# Patient Record
Sex: Male | Born: 1950 | Race: White | Hispanic: No | Marital: Single | Smoking: Never smoker
Health system: Southern US, Community
[De-identification: ages and names within clinical notes are randomized; demographics above are authoritative.]

## PROBLEM LIST (undated history)

## (undated) DIAGNOSIS — I2510 Atherosclerotic heart disease of native coronary artery without angina pectoris: Secondary | ICD-10-CM

## (undated) DIAGNOSIS — I4892 Unspecified atrial flutter: Secondary | ICD-10-CM

## (undated) DIAGNOSIS — I499 Cardiac arrhythmia, unspecified: Secondary | ICD-10-CM

## (undated) DIAGNOSIS — L4050 Arthropathic psoriasis, unspecified: Secondary | ICD-10-CM

## (undated) DIAGNOSIS — H8120 Vestibular neuronitis, unspecified ear: Secondary | ICD-10-CM

## (undated) DIAGNOSIS — Z888 Allergy status to other drugs, medicaments and biological substances status: Secondary | ICD-10-CM

## (undated) DIAGNOSIS — K5790 Diverticulosis of intestine, part unspecified, without perforation or abscess without bleeding: Secondary | ICD-10-CM

## (undated) DIAGNOSIS — Z87442 Personal history of urinary calculi: Secondary | ICD-10-CM

## (undated) DIAGNOSIS — E7800 Pure hypercholesterolemia, unspecified: Secondary | ICD-10-CM

## (undated) DIAGNOSIS — E119 Type 2 diabetes mellitus without complications: Secondary | ICD-10-CM

## (undated) DIAGNOSIS — J329 Chronic sinusitis, unspecified: Secondary | ICD-10-CM

## (undated) DIAGNOSIS — I10 Essential (primary) hypertension: Secondary | ICD-10-CM

## (undated) DIAGNOSIS — M1990 Unspecified osteoarthritis, unspecified site: Secondary | ICD-10-CM

## (undated) HISTORY — PX: OTHER SURGICAL HISTORY: SHX169

## (undated) HISTORY — DX: Pure hypercholesterolemia, unspecified: E78.00

## (undated) HISTORY — DX: Atherosclerotic heart disease of native coronary artery without angina pectoris: I25.10

## (undated) HISTORY — DX: Unspecified osteoarthritis, unspecified site: M19.90

## (undated) HISTORY — DX: Chronic sinusitis, unspecified: J32.9

## (undated) HISTORY — DX: Essential (primary) hypertension: I10

## (undated) HISTORY — DX: Vestibular neuronitis, unspecified ear: H81.20

## (undated) HISTORY — DX: Diverticulosis of intestine, part unspecified, without perforation or abscess without bleeding: K57.90

## (undated) HISTORY — DX: Arthropathic psoriasis, unspecified: L40.50

## (undated) HISTORY — DX: Type 2 diabetes mellitus without complications: E11.9

## (undated) HISTORY — DX: Unspecified atrial flutter: I48.92

## (undated) HISTORY — PX: INGUINAL HERNIA REPAIR: SUR1180

## (undated) HISTORY — DX: Allergy status to other drugs, medicaments and biological substances: Z88.8

---

## 1999-02-06 HISTORY — PX: COLON SURGERY: SHX602

## 2009-01-07 ENCOUNTER — Emergency Department (HOSPITAL_COMMUNITY): Admission: EM | Admit: 2009-01-07 | Discharge: 2009-01-07 | Payer: Self-pay | Admitting: Emergency Medicine

## 2009-01-27 ENCOUNTER — Inpatient Hospital Stay (HOSPITAL_BASED_OUTPATIENT_CLINIC_OR_DEPARTMENT_OTHER): Admission: RE | Admit: 2009-01-27 | Discharge: 2009-01-27 | Payer: Self-pay | Admitting: Cardiology

## 2009-02-05 HISTORY — PX: CORONARY STENT PLACEMENT: SHX1402

## 2009-02-10 ENCOUNTER — Inpatient Hospital Stay (HOSPITAL_COMMUNITY): Admission: AD | Admit: 2009-02-10 | Discharge: 2009-02-11 | Payer: Self-pay | Admitting: Cardiology

## 2009-02-24 ENCOUNTER — Encounter (HOSPITAL_COMMUNITY): Admission: RE | Admit: 2009-02-24 | Discharge: 2009-03-25 | Payer: Self-pay | Admitting: Cardiology

## 2009-05-14 ENCOUNTER — Inpatient Hospital Stay (HOSPITAL_COMMUNITY): Admission: EM | Admit: 2009-05-14 | Discharge: 2009-05-15 | Payer: Self-pay | Admitting: Emergency Medicine

## 2009-11-25 ENCOUNTER — Ambulatory Visit: Payer: Self-pay | Admitting: Cardiology

## 2009-11-28 ENCOUNTER — Ambulatory Visit: Payer: Self-pay | Admitting: Cardiology

## 2010-03-02 ENCOUNTER — Ambulatory Visit: Payer: Self-pay | Admitting: Cardiology

## 2010-04-23 LAB — CBC
HCT: 39.8 % (ref 39.0–52.0)
Hemoglobin: 13.9 g/dL (ref 13.0–17.0)
MCHC: 34.9 g/dL (ref 30.0–36.0)
MCV: 93.8 fL (ref 78.0–100.0)
Platelets: 264 10*3/uL (ref 150–400)
RBC: 4.24 MIL/uL (ref 4.22–5.81)
RDW: 12.5 % (ref 11.5–15.5)
WBC: 17.8 10*3/uL — ABNORMAL HIGH (ref 4.0–10.5)

## 2010-04-23 LAB — BASIC METABOLIC PANEL
BUN: 15 mg/dL (ref 6–23)
CO2: 28 mEq/L (ref 19–32)
Calcium: 8.8 mg/dL (ref 8.4–10.5)
Chloride: 102 mEq/L (ref 96–112)
Creatinine, Ser: 1.19 mg/dL (ref 0.4–1.5)
GFR calc Af Amer: >60 mL/min (ref >60)
GFR calc non Af Amer: >60 mL/min (ref >60)
Glucose, Bld: 112 mg/dL — ABNORMAL HIGH (ref 70–99)
Potassium: 3.6 mEq/L (ref 3.5–5.1)
Sodium: 137 mEq/L (ref 135–145)

## 2010-04-26 LAB — TROPONIN I: Troponin I: 0.03 ng/mL (ref 0.00–0.06)

## 2010-04-26 LAB — CBC
HCT: 45.8 % (ref 39.0–52.0)
Hemoglobin: 15.7 g/dL (ref 13.0–17.0)
MCHC: 34.3 g/dL (ref 30.0–36.0)
MCV: 94.1 fL (ref 78.0–100.0)
Platelets: 285 10*3/uL (ref 150–400)
RBC: 4.87 MIL/uL (ref 4.22–5.81)
RDW: 12.6 % (ref 11.5–15.5)
WBC: 10.9 10*3/uL — ABNORMAL HIGH (ref 4.0–10.5)

## 2010-04-26 LAB — BASIC METABOLIC PANEL
BUN: 13 mg/dL (ref 6–23)
CO2: 28 mEq/L (ref 19–32)
Calcium: 9.6 mg/dL (ref 8.4–10.5)
Chloride: 107 mEq/L (ref 96–112)
Creatinine, Ser: 1.06 mg/dL (ref 0.4–1.5)
GFR calc Af Amer: >60 mL/min (ref >60)
GFR calc non Af Amer: >60 mL/min (ref >60)
Glucose, Bld: 114 mg/dL — ABNORMAL HIGH (ref 70–99)
Potassium: 4.1 mEq/L (ref 3.5–5.1)
Sodium: 141 mEq/L (ref 135–145)

## 2010-04-26 LAB — DIFFERENTIAL
Basophils Absolute: 0.2 10*3/uL — ABNORMAL HIGH (ref 0.0–0.1)
Basophils Relative: 2 % — ABNORMAL HIGH (ref 0–1)
Eosinophils Absolute: 0.5 10*3/uL (ref 0.0–0.7)
Eosinophils Relative: 4 % (ref 0–5)
Lymphocytes Relative: 37 % (ref 12–46)
Lymphs Abs: 4 10*3/uL (ref 0.7–4.0)
Monocytes Absolute: 0.9 10*3/uL (ref 0.1–1.0)
Monocytes Relative: 8 % (ref 3–12)
Neutro Abs: 5.4 10*3/uL (ref 1.7–7.7)
Neutrophils Relative %: 49 % (ref 43–77)

## 2010-04-26 LAB — CARDIAC PANEL(CRET KIN+CKTOT+MB+TROPI)
CK, MB: 3.3 ng/mL (ref 0.3–4.0)
CK, MB: 3.7 ng/mL (ref 0.3–4.0)
Relative Index: 2 (ref 0.0–2.5)
Relative Index: 2.5 (ref 0.0–2.5)
Total CK: 130 U/L (ref 7–232)
Total CK: 188 U/L (ref 7–232)
Troponin I: 0.01 ng/mL (ref 0.00–0.06)
Troponin I: 0.02 ng/mL (ref 0.00–0.06)

## 2010-04-26 LAB — POCT CARDIAC MARKERS
CKMB, poc: 3.5 ng/mL (ref 1.0–8.0)
Myoglobin, poc: 125 ng/mL (ref 12–200)
Troponin i, poc: <0.05 ng/mL (ref 0.00–0.09)

## 2010-04-26 LAB — CK TOTAL AND CKMB (NOT AT ARMC)
CK, MB: 4.6 ng/mL — ABNORMAL HIGH (ref 0.3–4.0)
Relative Index: 2.4 (ref 0.0–2.5)
Total CK: 191 U/L (ref 7–232)

## 2010-04-26 LAB — T4, FREE: Free T4: 0.83 ng/dL (ref 0.80–1.80)

## 2010-04-26 LAB — TSH: TSH: 4.865 u[IU]/mL — ABNORMAL HIGH (ref 0.350–4.500)

## 2010-05-09 LAB — CBC
HCT: 43 % (ref 39.0–52.0)
Hemoglobin: 15.1 g/dL (ref 13.0–17.0)
MCHC: 35 g/dL (ref 30.0–36.0)
MCV: 94.1 fL (ref 78.0–100.0)
Platelets: 239 10*3/uL (ref 150–400)
RBC: 4.58 MIL/uL (ref 4.22–5.81)
RDW: 12.7 % (ref 11.5–15.5)
WBC: 14.1 10*3/uL — ABNORMAL HIGH (ref 4.0–10.5)

## 2010-05-09 LAB — TROPONIN I: Troponin I: 0.08 ng/mL — ABNORMAL HIGH (ref 0.00–0.06)

## 2010-05-09 LAB — POCT CARDIAC MARKERS
CKMB, poc: 6 ng/mL (ref 1.0–8.0)
Myoglobin, poc: >500 ng/mL (ref 12–200)
Troponin i, poc: <0.05 ng/mL (ref 0.00–0.09)

## 2010-05-09 LAB — BASIC METABOLIC PANEL
BUN: 14 mg/dL (ref 6–23)
CO2: 27 mEq/L (ref 19–32)
Calcium: 9.1 mg/dL (ref 8.4–10.5)
Chloride: 101 mEq/L (ref 96–112)
Creatinine, Ser: 1.02 mg/dL (ref 0.4–1.5)
GFR calc Af Amer: >60 mL/min (ref >60)
GFR calc non Af Amer: >60 mL/min (ref >60)
Glucose, Bld: 112 mg/dL — ABNORMAL HIGH (ref 70–99)
Potassium: 3.6 mEq/L (ref 3.5–5.1)
Sodium: 134 mEq/L — ABNORMAL LOW (ref 135–145)

## 2010-06-02 ENCOUNTER — Telehealth: Payer: Self-pay | Admitting: Cardiology

## 2010-06-02 DIAGNOSIS — I10 Essential (primary) hypertension: Secondary | ICD-10-CM

## 2010-06-02 MED ORDER — BISOPROLOL FUMARATE 5 MG PO TABS: 5.0000 mg | ORAL_TABLET | Freq: Every day | ORAL | Status: DC

## 2010-06-02 NOTE — Telephone Encounter (Signed)
escribe medication per fax request  

## 2010-06-02 NOTE — Telephone Encounter (Signed)
NEEDS SCRIPTS CALLED INTO [facility] 30 DAY SUPPLY - BISOPROLOL  5MG  MAIL ORDER COM. LOST HIS ORDER

## 2010-07-20 ENCOUNTER — Telehealth: Payer: Self-pay | Admitting: Cardiology

## 2010-07-20 NOTE — Telephone Encounter (Signed)
Pt wants referral sent to Dr Mallie Mussel for arthritis, fax number is [phone].Alfonso Ramus RN

## 2010-07-21 NOTE — Telephone Encounter (Signed)
Referral sent to dr Tawana Scale office and i called [facility] to pt to inform it was done, i didnt set app, told pt to call there and set app, to call back if any problems.Alfonso Ramus RN

## 2010-07-24 ENCOUNTER — Telehealth: Payer: Self-pay | Admitting: Cardiology

## 2010-07-24 NOTE — Telephone Encounter (Signed)
Pt called  Referral was sent and was refaxed. Pt contacted

## 2010-07-24 NOTE — Telephone Encounter (Signed)
Called wondering if the referral for Dr. Dierdre Forth had been sent out fax: [phone]. He says he has been getting conflicting stories and would like to clear the air.

## 2010-07-27 ENCOUNTER — Encounter: Payer: Self-pay | Admitting: Cardiology

## 2010-09-18 ENCOUNTER — Encounter: Payer: Self-pay | Admitting: *Deleted

## 2010-09-18 DIAGNOSIS — M1990 Unspecified osteoarthritis, unspecified site: Secondary | ICD-10-CM | POA: Insufficient documentation

## 2010-09-18 DIAGNOSIS — E1169 Type 2 diabetes mellitus with other specified complication: Secondary | ICD-10-CM | POA: Insufficient documentation

## 2010-09-18 DIAGNOSIS — I4892 Unspecified atrial flutter: Secondary | ICD-10-CM | POA: Insufficient documentation

## 2010-09-18 DIAGNOSIS — J329 Chronic sinusitis, unspecified: Secondary | ICD-10-CM | POA: Insufficient documentation

## 2010-09-18 DIAGNOSIS — E7800 Pure hypercholesterolemia, unspecified: Secondary | ICD-10-CM | POA: Insufficient documentation

## 2010-09-18 DIAGNOSIS — K5790 Diverticulosis of intestine, part unspecified, without perforation or abscess without bleeding: Secondary | ICD-10-CM | POA: Insufficient documentation

## 2010-09-25 ENCOUNTER — Other Ambulatory Visit: Payer: Self-pay | Admitting: Cardiology

## 2010-09-25 DIAGNOSIS — E7800 Pure hypercholesterolemia, unspecified: Secondary | ICD-10-CM

## 2010-09-25 DIAGNOSIS — I10 Essential (primary) hypertension: Secondary | ICD-10-CM

## 2010-09-25 DIAGNOSIS — I2510 Atherosclerotic heart disease of native coronary artery without angina pectoris: Secondary | ICD-10-CM

## 2010-09-28 ENCOUNTER — Encounter: Payer: Self-pay | Admitting: Cardiology

## 2010-09-28 ENCOUNTER — Other Ambulatory Visit: Payer: Self-pay | Admitting: *Deleted

## 2010-09-28 ENCOUNTER — Ambulatory Visit (INDEPENDENT_AMBULATORY_CARE_PROVIDER_SITE_OTHER): Payer: BC Managed Care – PPO | Admitting: *Deleted

## 2010-09-28 ENCOUNTER — Ambulatory Visit (INDEPENDENT_AMBULATORY_CARE_PROVIDER_SITE_OTHER): Payer: BC Managed Care – PPO | Admitting: Cardiology

## 2010-09-28 DIAGNOSIS — E785 Hyperlipidemia, unspecified: Secondary | ICD-10-CM

## 2010-09-28 DIAGNOSIS — I10 Essential (primary) hypertension: Secondary | ICD-10-CM

## 2010-09-28 DIAGNOSIS — E7800 Pure hypercholesterolemia, unspecified: Secondary | ICD-10-CM

## 2010-09-28 DIAGNOSIS — I2510 Atherosclerotic heart disease of native coronary artery without angina pectoris: Secondary | ICD-10-CM

## 2010-09-28 DIAGNOSIS — E1159 Type 2 diabetes mellitus with other circulatory complications: Secondary | ICD-10-CM | POA: Insufficient documentation

## 2010-09-28 DIAGNOSIS — I152 Hypertension secondary to endocrine disorders: Secondary | ICD-10-CM | POA: Insufficient documentation

## 2010-09-28 DIAGNOSIS — I4892 Unspecified atrial flutter: Secondary | ICD-10-CM

## 2010-09-28 LAB — BASIC METABOLIC PANEL
BUN: 16 mg/dL (ref 6–23)
CO2: 29 mEq/L (ref 19–32)
Calcium: 9.4 mg/dL (ref 8.4–10.5)
Chloride: 105 mEq/L (ref 96–112)
Creatinine, Ser: 1.2 mg/dL (ref 0.4–1.5)
GFR: 65.38 mL/min (ref >60.00)
Glucose, Bld: 102 mg/dL — ABNORMAL HIGH (ref 70–99)
Potassium: 4.4 mEq/L (ref 3.5–5.1)
Sodium: 140 mEq/L (ref 135–145)

## 2010-09-28 LAB — HEPATIC FUNCTION PANEL
ALT: 30 U/L (ref 0–53)
AST: 34 U/L (ref 0–37)
Albumin: 4.2 g/dL (ref 3.5–5.2)
Alkaline Phosphatase: 58 U/L (ref 39–117)
Bilirubin, Direct: 0.1 mg/dL (ref 0.0–0.3)
Total Bilirubin: 0.7 mg/dL (ref 0.3–1.2)
Total Protein: 6.6 g/dL (ref 6.0–8.3)

## 2010-09-28 LAB — LIPID PANEL
Cholesterol: 184 mg/dL (ref 0–200)
HDL: 36.8 mg/dL — ABNORMAL LOW (ref >39.00)
Total CHOL/HDL Ratio: 5
Triglycerides: 225 mg/dL — ABNORMAL HIGH (ref 0.0–149.0)
VLDL: 45 mg/dL — ABNORMAL HIGH (ref 0.0–40.0)

## 2010-09-28 LAB — LDL CHOLESTEROL, DIRECT: Direct LDL: 114.7 mg/dL

## 2010-09-28 MED ORDER — FLUTICASONE PROPIONATE 50 MCG/ACT NA SUSP: 2.0000 | Freq: Every day | NASAL | Status: DC

## 2010-09-28 NOTE — Assessment & Plan Note (Signed)
Status post stenting of the first obtuse marginal vessel in January of 2011. He is asymptomatic. He had 40-50% stenosis in the mid LAD and a 70% stenosis and a right ventricular marginal branch. We discussed followup stress testing but the patient doesn't want to pursue that at this time. He is asymptomatic. He will continue with aspirin therapy.

## 2010-09-28 NOTE — Patient Instructions (Addendum)
Continue your current medications.  We will call with the results of your lab work today.  I will see you again in 6  Months.

## 2010-09-28 NOTE — Progress Notes (Signed)
Shawn Hernandez Payer Date of Birth: 1951-02-05   History of Present Illness: Shawn Hernandez is seen today for six-month followup visit. He reports that he has been doing very well. He denies any chest pain or shortness of breath. He complains of chronic sinus congestion. He has been diagnosed with psoriatic arthritis and has recently been started on methotrexate. He has gained 10 pounds since his last visit. He is exercising some but walking only 10-15 minutes. He is trying to restrict sweet intake. He is tolerating niacin well.  Current Outpatient Prescriptions on File Prior to Visit  Medication Sig Dispense Refill  . aspirin 81 MG tablet Take 325 mg by mouth daily.       . bisoprolol (ZEBETA) 5 MG tablet Take 1 tablet (5 mg total) by mouth daily.  30 tablet  5  . ezetimibe (ZETIA) 10 MG tablet Take 10 mg by mouth daily.        . fish oil-omega-3 fatty acids 1000 MG capsule Take 2 g by mouth daily.        Marland Kitchen FOLIC ACID PO Take by mouth.        . niacin (NIASPAN) 1000 MG CR tablet Take 1,000 mg by mouth at bedtime.        Marland Kitchen DISCONTD: fluticasone (FLONASE) 50 MCG/ACT nasal spray Place 2 sprays into the nose daily. As needed       . KRILL OIL PO Take by mouth daily.          Allergies  Allergen Reactions  . Ivp Dye (Iodinated Diagnostic Agents)   . Lisinopril     cough  . Statins     myalgias    Past Medical History  Diagnosis Date  . Coronary artery disease   . Hypercholesterolemia   . Hypertension   . Atrial flutter, paroxysmal   . Sinusitis   . Diverticulosis   . Arthritis     discomfort in hands and arms  . Allergy to iodine     Past Surgical History  Procedure Date  . Colon surgery 2001    2 feet removed re- diverticulosis  . Other surgical history     appendectomy  . Inguinal hernia repair   . Coronary stent placement 2011    sequential bare-metal    History  Smoking status  . Never Smoker   Smokeless tobacco  . Not on file    History  Alcohol Use     Family  History  Problem Relation Age of Onset  . Heart disease Mother   . Heart disease Father   . Heart disease Brother     Review of Systems: As noted in history of present illness above. Blood pressure readings at home have been good with typical readings of 125-140 systolic and 68-76 diastolic. All other systems were reviewed and are negative.  Physical Exam: BP 140/88  Pulse 60  Ht 6' (1.829 m)  Wt 240 lb (108.863 kg)  BMI 32.55 kg/m2 The patient is alert and oriented x 3.  The mood and affect are normal.  The skin is warm and dry.  Color is normal.  The HEENT exam reveals that the sclera are nonicteric.  The mucous membranes are moist.  The carotids are 2+ without bruits.  There is no thyromegaly.  There is no JVD.  The lungs are clear.  The chest wall is non tender.  The heart exam reveals a regular rate with a normal S1 and S2.  There are no murmurs,  gallops, or rubs.  The PMI is not displaced.   Abdominal exam reveals good bowel sounds. It is obese. There is no guarding or rebound.  There is no hepatosplenomegaly or tenderness.  There are no masses.  Exam of the legs reveal no clubbing, cyanosis, or edema.  The legs are without rashes.  The distal pulses are intact.  Cranial nerves II - XII are intact.  Motor and sensory functions are intact.  The gait is normal. LABORATORY DATA:   Assessment / Plan:

## 2010-09-28 NOTE — Assessment & Plan Note (Signed)
Blood pressure is mildly elevated today. Have encouraged him to increase his aerobic activity and try to lose weight. We will continue on his current antihypertensive therapy and monitor his blood pressure closely at home.

## 2010-09-28 NOTE — Assessment & Plan Note (Signed)
No symptomatic recurrences on beta blocker therapy.

## 2010-09-28 NOTE — Assessment & Plan Note (Signed)
He has been intolerant to statins in the past. He is currently on niacin and Zetia. We will follow up lipid panel today and chemistries.

## 2010-09-29 ENCOUNTER — Telehealth: Payer: Self-pay | Admitting: *Deleted

## 2010-09-29 NOTE — Telephone Encounter (Signed)
Notified of lab results. Will send copy to Dr. Alben Deeds, Rheumatology.

## 2010-09-29 NOTE — Telephone Encounter (Signed)
Message copied by Lorayne Bender on Fri Sep 29, 2010  3:29 PM ------      Message from: [place], PETER M      Created: Thu Sep 28, 2010  5:46 PM       Chemistries are normal. No lipid levels to compare. Intolerant of statins. On Zetia and niaspan. Would try and increase niaspan to 2 grams daily. Needs to lose weight.

## 2010-09-29 NOTE — Progress Notes (Signed)
lm

## 2010-11-10 ENCOUNTER — Other Ambulatory Visit: Payer: Self-pay | Admitting: Otolaryngology

## 2010-11-10 DIAGNOSIS — H8309 Labyrinthitis, unspecified ear: Secondary | ICD-10-CM

## 2010-11-14 ENCOUNTER — Other Ambulatory Visit: Payer: Self-pay | Admitting: *Deleted

## 2010-11-14 MED ORDER — EZETIMIBE 10 MG PO TABS: 10.0000 mg | ORAL_TABLET | Freq: Every day | ORAL | Status: DC

## 2010-11-21 ENCOUNTER — Other Ambulatory Visit: Payer: Self-pay | Admitting: *Deleted

## 2010-11-21 MED ORDER — EZETIMIBE 10 MG PO TABS: 10.0000 mg | ORAL_TABLET | Freq: Every day | ORAL | Status: DC

## 2010-12-01 ENCOUNTER — Ambulatory Visit
Admission: RE | Admit: 2010-12-01 | Discharge: 2010-12-01 | Disposition: A | Discharge status: Home / Self Care | Payer: BC Managed Care – PPO | Source: Ambulatory Visit | Attending: Otolaryngology | Admitting: Otolaryngology

## 2010-12-01 ENCOUNTER — Other Ambulatory Visit: Payer: BC Managed Care – PPO

## 2010-12-01 DIAGNOSIS — H8309 Labyrinthitis, unspecified ear: Secondary | ICD-10-CM

## 2010-12-01 MED ORDER — GADOBENATE DIMEGLUMINE 529 MG/ML IV SOLN
20.0000 mL | Freq: Once | INTRAVENOUS | Status: AC | PRN
  Administered 2010-12-01: 20 mL via INTRAVENOUS

## 2011-06-11 ENCOUNTER — Other Ambulatory Visit: Payer: Self-pay

## 2011-06-12 ENCOUNTER — Other Ambulatory Visit: Payer: Self-pay

## 2011-07-03 ENCOUNTER — Telehealth: Payer: Self-pay | Admitting: Cardiology

## 2011-07-03 NOTE — Telephone Encounter (Signed)
Patient called no answer.LMTC. 

## 2011-07-03 NOTE — Telephone Encounter (Signed)
New msg Pt was calling about bisoprolol 5 mg to get refill thru [facility]. He wants to talk to you about this med.

## 2011-07-04 MED ORDER — BISOPROLOL FUMARATE 5 MG PO TABS: 5.0000 mg | ORAL_TABLET | Freq: Every day | ORAL | Status: DC

## 2011-07-04 NOTE — Telephone Encounter (Signed)
Patient called stated he needed bisoprolol 5 mg called in locally [facility] and also send to [facility] for 90 day supply.

## 2011-07-04 NOTE — Telephone Encounter (Signed)
Patient called no answer.LMTC. 

## 2011-07-04 NOTE — Telephone Encounter (Signed)
Pt rtn call , pls call back [phone]

## 2011-08-10 ENCOUNTER — Ambulatory Visit (INDEPENDENT_AMBULATORY_CARE_PROVIDER_SITE_OTHER): Payer: BC Managed Care – PPO | Admitting: Cardiology

## 2011-08-10 ENCOUNTER — Encounter: Payer: Self-pay | Admitting: Cardiology

## 2011-08-10 VITALS — BP 142/88 | HR 53 | Ht 72.0 in | Wt 239.0 lb

## 2011-08-10 DIAGNOSIS — I2510 Atherosclerotic heart disease of native coronary artery without angina pectoris: Secondary | ICD-10-CM

## 2011-08-10 DIAGNOSIS — E785 Hyperlipidemia, unspecified: Secondary | ICD-10-CM

## 2011-08-10 DIAGNOSIS — I4892 Unspecified atrial flutter: Secondary | ICD-10-CM

## 2011-08-10 DIAGNOSIS — E7800 Pure hypercholesterolemia, unspecified: Secondary | ICD-10-CM

## 2011-08-10 LAB — LDL CHOLESTEROL, DIRECT: Direct LDL: 107.6 mg/dL

## 2011-08-10 LAB — HEPATIC FUNCTION PANEL
ALT: 29 U/L (ref 0–53)
AST: 28 U/L (ref 0–37)
Albumin: 4.5 g/dL (ref 3.5–5.2)
Alkaline Phosphatase: 50 U/L (ref 39–117)
Bilirubin, Direct: 0 mg/dL (ref 0.0–0.3)
Total Bilirubin: 1 mg/dL (ref 0.3–1.2)
Total Protein: 7.3 g/dL (ref 6.0–8.3)

## 2011-08-10 LAB — LIPID PANEL
Cholesterol: 188 mg/dL (ref 0–200)
HDL: 39.6 mg/dL (ref >39.00)
Total CHOL/HDL Ratio: 5
Triglycerides: 285 mg/dL — ABNORMAL HIGH (ref 0.0–149.0)
VLDL: 57 mg/dL — ABNORMAL HIGH (ref 0.0–40.0)

## 2011-08-10 NOTE — Patient Instructions (Addendum)
We will check fasting lab work today and call with the results.  Continue your other therapy, you can switch to ASA 81 mg daily  I will see you again in 1 year

## 2011-08-10 NOTE — Assessment & Plan Note (Signed)
He has no symptomatic recurrence of atrial flutter. We will continue with aspirin and bisoprolol. We'll reduce aspirin to 81 mg per day.

## 2011-08-10 NOTE — Assessment & Plan Note (Signed)
He is intolerant to statins and niacin. We'll check fasting lab work today on [employer]. He may be a candidate for fibrate therapy since his last panel showed elevated triglycerides. I recommended that he take 2-4 g of fish oil daily.

## 2011-08-10 NOTE — Progress Notes (Signed)
Shawn Hernandez Date of Birth: 30-Jul-1950   History of Present Illness: Shawn Hernandez is seen today for a followup visit. He states he is doing very well from a cardiac standpoint. He denies any symptoms of chest pain, palpitations, or shortness of breath. He is no longer taking niacin because of complaints of itching. He remains on Zetia. He is intolerant to statins. He takes fish oil sporadically. His exercises limited because of his psoriatic arthritis.  Current Outpatient Prescriptions on File Prior to Visit  Medication Sig Dispense Refill  . Adalimumab (HUMIRA [place]) Inject into the skin.      Marland Kitchen aspirin 81 MG tablet Take 325 mg by mouth daily.       . bisoprolol (ZEBETA) 5 MG tablet Take 1 tablet (5 mg total) by mouth daily.  90 tablet  3  . ezetimibe (ZETIA) 10 MG tablet Take 1 tablet (10 mg total) by mouth daily.  90 tablet  3  . fish oil-omega-3 fatty acids 1000 MG capsule Take 2 g by mouth daily.        Marland Kitchen FOLIC ACID PO Take by mouth.        . methotrexate (RHEUMATREX) 2.5 MG tablet Take 2.5 mg by mouth once a week.       . bisoprolol (ZEBETA) 5 MG tablet Take 1 tablet (5 mg total) by mouth daily.  30 tablet  5    Allergies  Allergen Reactions  . Ivp Dye (Iodinated Diagnostic Agents)   . Lisinopril     cough  . Statins     myalgias    Past Medical History  Diagnosis Date  . Coronary artery disease   . Hypercholesterolemia   . Hypertension   . Atrial flutter, paroxysmal   . Sinusitis   . Diverticulosis   . Arthritis     discomfort in hands and arms  . Allergy to iodine   . Psoriatic arthritis     Past Surgical History  Procedure Date  . Colon surgery 2001    2 feet removed re- diverticulosis  . Other surgical history     appendectomy  . Inguinal hernia repair   . Coronary stent placement 2011    sequential bare-metal    History  Smoking status  . Never Smoker   Smokeless tobacco  . Not on file    History  Alcohol Use     Family History  Problem  Relation Age of Onset  . Heart disease Mother   . Heart disease Father   . Heart disease Brother     Review of Systems: As noted in history of present illness above. He is now taking Humira for his psoriatic arthritis. All other systems were reviewed and are negative.  Physical Exam: BP 142/88  Pulse 53  Ht 6' (1.829 m)  Wt 108.41 kg (239 lb)  BMI 32.41 kg/m2 The patient is alert and oriented x 3.  The mood and affect are normal.  The skin is warm and dry.  Color is normal.  The HEENT exam reveals that the sclera are nonicteric.  The mucous membranes are moist.  The carotids are 2+ without bruits.  There is no thyromegaly.  There is no JVD.  The lungs are clear.  The chest wall is non tender.  The heart exam reveals a regular rate with a normal S1 and S2.  There are no murmurs, gallops, or rubs.  The PMI is not displaced.   Abdominal exam reveals good bowel sounds. It  is obese. There is no guarding or rebound.  There is no hepatosplenomegaly or tenderness.  There are no masses.  Exam of the legs reveal no clubbing, cyanosis, or edema.  The legs are without rashes.  The distal pulses are intact.  Cranial nerves II - XII are intact.  Motor and sensory functions are intact.  The gait is normal. LABORATORY DATA:  ECG today demonstrates sinus bradycardia with a rate of 53 beats per minute. It is otherwise normal.   Assessment / Plan:

## 2011-08-10 NOTE — Assessment & Plan Note (Signed)
He remains asymptomatic. He is now 2 years out from his stent to the obtuse marginal vessel. Continue risk factor modification.

## 2011-08-16 ENCOUNTER — Other Ambulatory Visit: Payer: Self-pay

## 2011-08-16 ENCOUNTER — Telehealth: Payer: Self-pay | Admitting: Cardiology

## 2011-08-16 DIAGNOSIS — E785 Hyperlipidemia, unspecified: Secondary | ICD-10-CM

## 2011-08-16 MED ORDER — FENOFIBRATE 145 MG PO TABS: 145.0000 mg | ORAL_TABLET | Freq: Every day | ORAL | Status: DC

## 2011-08-16 NOTE — Telephone Encounter (Signed)
Patient called lab results given.Dr.Jordan prescribed tricor 145 mg daily.Repeat lipids and hepatic panels in 3 months.

## 2011-08-16 NOTE — Telephone Encounter (Signed)
New problem:  Patient returning call back nurse.

## 2011-11-27 ENCOUNTER — Other Ambulatory Visit (INDEPENDENT_AMBULATORY_CARE_PROVIDER_SITE_OTHER): Payer: BC Managed Care – PPO

## 2011-11-27 DIAGNOSIS — E785 Hyperlipidemia, unspecified: Secondary | ICD-10-CM

## 2011-11-27 LAB — HEPATIC FUNCTION PANEL
ALT: 35 U/L (ref 0–53)
AST: 31 U/L (ref 0–37)
Albumin: 4.1 g/dL (ref 3.5–5.2)
Alkaline Phosphatase: 34 U/L — ABNORMAL LOW (ref 39–117)
Bilirubin, Direct: 0 mg/dL (ref 0.0–0.3)
Total Bilirubin: 0.6 mg/dL (ref 0.3–1.2)
Total Protein: 6.6 g/dL (ref 6.0–8.3)

## 2011-11-27 LAB — LIPID PANEL
Cholesterol: 169 mg/dL (ref 0–200)
HDL: 34.9 mg/dL — ABNORMAL LOW (ref >39.00)
LDL Cholesterol: 104 mg/dL — ABNORMAL HIGH (ref 0–99)
Total CHOL/HDL Ratio: 5
Triglycerides: 151 mg/dL — ABNORMAL HIGH (ref 0.0–149.0)
VLDL: 30.2 mg/dL (ref 0.0–40.0)

## 2011-11-30 ENCOUNTER — Telehealth: Payer: Self-pay | Admitting: Cardiology

## 2011-11-30 NOTE — Telephone Encounter (Signed)
PT CALLING BACK TO [facility] RE RESULTS

## 2011-11-30 NOTE — Telephone Encounter (Signed)
Patient called lab results given. 

## 2011-12-11 ENCOUNTER — Other Ambulatory Visit: Payer: Self-pay | Admitting: *Deleted

## 2011-12-11 MED ORDER — EZETIMIBE 10 MG PO TABS: 10.0000 mg | ORAL_TABLET | Freq: Every day | ORAL | Status: DC

## 2012-03-14 ENCOUNTER — Telehealth: Payer: Self-pay | Admitting: Cardiology

## 2012-03-14 NOTE — Telephone Encounter (Signed)
Pt needs last lab work that was done sent to Dr. Dierdre Forth office# [phone] fax# [phone]

## 2012-03-14 NOTE — Telephone Encounter (Signed)
Copy of 11/26/12 lipid and hepatic panels faxed to Dr.Beekman at fax # [phone].

## 2012-07-11 ENCOUNTER — Other Ambulatory Visit: Payer: Self-pay | Admitting: Cardiology

## 2012-07-11 NOTE — Telephone Encounter (Signed)
ezetimibe (ZETIA) 10 MG tablet  Take 1 tablet (10 mg total) by mouth daily.   90 tablet   Patient Instructions  We will check fasting lab work today and call with the results. Continue your other therapy, you can switch to ASA 81 mg daily I will see you again in 1 year

## 2012-07-16 ENCOUNTER — Other Ambulatory Visit: Payer: Self-pay

## 2012-07-16 MED ORDER — EZETIMIBE 10 MG PO TABS: 10.0000 mg | ORAL_TABLET | Freq: Every day | ORAL | Status: DC

## 2012-07-16 NOTE — Telephone Encounter (Signed)
ezetimibe (ZETIA) 10 MG tablet  Take 1 tablet (10 mg total) by mouth daily.   90 tablet   3

## 2012-07-17 ENCOUNTER — Telehealth: Payer: Self-pay

## 2012-07-17 NOTE — Telephone Encounter (Signed)
pt called to ck status of refills rqst. pt notified that they were already completed.

## 2012-11-22 ENCOUNTER — Other Ambulatory Visit: Payer: Self-pay | Admitting: Cardiology

## 2012-11-24 ENCOUNTER — Other Ambulatory Visit: Payer: Self-pay | Admitting: *Deleted

## 2012-11-24 MED ORDER — EZETIMIBE 10 MG PO TABS: 10.0000 mg | ORAL_TABLET | Freq: Every day | ORAL | Status: DC

## 2012-11-26 ENCOUNTER — Other Ambulatory Visit: Payer: Self-pay

## 2012-12-24 ENCOUNTER — Other Ambulatory Visit: Payer: Self-pay | Admitting: Cardiology

## 2012-12-30 ENCOUNTER — Other Ambulatory Visit: Payer: Self-pay

## 2012-12-30 MED ORDER — BISOPROLOL FUMARATE 5 MG PO TABS: ORAL_TABLET | ORAL | Status: DC

## 2013-01-12 ENCOUNTER — Other Ambulatory Visit: Payer: Self-pay

## 2013-01-12 MED ORDER — FENOFIBRATE 145 MG PO TABS: 145.0000 mg | ORAL_TABLET | Freq: Every day | ORAL | Status: DC

## 2013-01-22 ENCOUNTER — Other Ambulatory Visit: Payer: Self-pay | Admitting: Cardiology

## 2013-02-07 ENCOUNTER — Other Ambulatory Visit: Payer: Self-pay | Admitting: Cardiology

## 2013-02-23 ENCOUNTER — Other Ambulatory Visit: Payer: Self-pay | Admitting: Cardiology

## 2013-03-04 ENCOUNTER — Encounter: Payer: Self-pay | Admitting: Cardiology

## 2013-03-04 ENCOUNTER — Ambulatory Visit (INDEPENDENT_AMBULATORY_CARE_PROVIDER_SITE_OTHER): Payer: BC Managed Care – PPO | Admitting: Cardiology

## 2013-03-04 VITALS — BP 142/80 | HR 56 | Ht 72.0 in | Wt 253.8 lb

## 2013-03-04 DIAGNOSIS — I4892 Unspecified atrial flutter: Secondary | ICD-10-CM

## 2013-03-04 DIAGNOSIS — I2510 Atherosclerotic heart disease of native coronary artery without angina pectoris: Secondary | ICD-10-CM

## 2013-03-04 DIAGNOSIS — E7800 Pure hypercholesterolemia, unspecified: Secondary | ICD-10-CM

## 2013-03-04 LAB — HEPATIC FUNCTION PANEL
ALT: 35 U/L (ref 0–53)
AST: 35 U/L (ref 0–37)
Albumin: 4.4 g/dL (ref 3.5–5.2)
Alkaline Phosphatase: 49 U/L (ref 39–117)
Bilirubin, Direct: 0 mg/dL (ref 0.0–0.3)
Total Bilirubin: 0.6 mg/dL (ref 0.3–1.2)
Total Protein: 7.3 g/dL (ref 6.0–8.3)

## 2013-03-04 LAB — LIPID PANEL
Cholesterol: 160 mg/dL (ref 0–200)
HDL: 33.2 mg/dL — ABNORMAL LOW (ref >39.00)
LDL Cholesterol: 95 mg/dL (ref 0–99)
Total CHOL/HDL Ratio: 5
Triglycerides: 159 mg/dL — ABNORMAL HIGH (ref 0.0–149.0)
VLDL: 31.8 mg/dL (ref 0.0–40.0)

## 2013-03-04 LAB — BASIC METABOLIC PANEL
BUN: 13 mg/dL (ref 6–23)
CO2: 27 mEq/L (ref 19–32)
Calcium: 9.6 mg/dL (ref 8.4–10.5)
Chloride: 104 mEq/L (ref 96–112)
Creatinine, Ser: 1.4 mg/dL (ref 0.4–1.5)
GFR: 55.2 mL/min — ABNORMAL LOW (ref >60.00)
Glucose, Bld: 78 mg/dL (ref 70–99)
Potassium: 4 mEq/L (ref 3.5–5.1)
Sodium: 137 mEq/L (ref 135–145)

## 2013-03-04 NOTE — Progress Notes (Signed)
Shawn Hernandez Date of Birth: [birth year]   History of Present Illness: Shawn Hernandez is seen today for a followup visit. He was last seen in July 2013.  He states he is doing very well from a cardiac standpoint. He denies any symptoms of chest pain, palpitations, or shortness of breath. He remains on Zetia. He is intolerant to statins and niacin. He has gained 14 lbs since his last visit. He is planning on doing better with his diet and exercise. He is 4 years out from stenting of the OM with a BMS.  Current Outpatient Prescriptions on File Prior to Visit  Medication Sig Dispense Refill  . aspirin 81 MG tablet Take 81 mg by mouth daily.       . bisoprolol (ZEBETA) 5 MG tablet TAKE 1 TABLET BY MOUTH ONCE DAILY  60 tablet  0  . fenofibrate (TRICOR) 145 MG tablet Take 1 tablet (145 mg total) by mouth daily.  30 tablet  0  . fish oil-omega-3 fatty acids 1000 MG capsule Take 2 g by mouth daily.        . Vitamin D, Ergocalciferol, (DRISDOL) 50000 UNITS CAPS Take 2,000 Units by mouth. VITAMIN D3      . ZETIA 10 MG tablet TAKE 1 TABLET BY MOUTH EVERY DAY  30 tablet  3   No current facility-administered medications on file prior to visit.    Allergies  Allergen Reactions  . Ivp Dye [Iodinated Diagnostic Agents]   . Lisinopril     cough  . Statins     myalgias    Past Medical History  Diagnosis Date  . Coronary artery disease   . Hypercholesterolemia   . Hypertension   . Atrial flutter, paroxysmal   . Sinusitis   . Diverticulosis   . Arthritis     discomfort in hands and arms  . Allergy to iodine   . Psoriatic arthritis     Past Surgical History  Procedure Laterality Date  . Colon surgery  2001    2 feet removed re- diverticulosis  . Other surgical history      appendectomy  . Inguinal hernia repair    . Coronary stent placement  2011    sequential bare-metal    History  Smoking status  . Never Smoker   Smokeless tobacco  . Not on file    History  Alcohol Use      Family History  Problem Relation Age of Onset  . Heart disease Mother   . Heart disease Father   . Heart disease Brother     Review of Systems: As noted in history of present illness above.  All other systems were reviewed and are negative.  Physical Exam: BP 142/80  Pulse 56  Ht 6' (1.829 m)  Wt 253 lb 12.8 oz (115.123 kg)  BMI 34.41 kg/m2 WD WM in NAD. The HEENT exam is normal.  The carotids are 2+ without bruits.  There is no thyromegaly.  There is no JVD.  The lungs are clear.  The chest wall is non tender.  The heart exam reveals a regular rate with a normal S1 and S2.  There are no murmurs, gallops, or rubs.  The PMI is not displaced.   Abdominal exam reveals good bowel sounds. It is obese.  There is no hepatosplenomegaly or tenderness.  There are no masses.  Exam of the legs reveal no clubbing, cyanosis, or edema.  The distal pulses are intact.  Cranial nerves II -  XII are intact.  Motor and sensory functions are intact.  The gait is normal. Skin reveals multiple lesions from recent dermatology treatment.   LABORATORY DATA:  ECG today demonstrates sinus bradycardia with a rate of 56 beats per minute. It is otherwise normal.   Assessment / Plan: 1. CAD s/p stenting of the OM in 2011 with a BMS. He is asymptomatic. I recommend ETT but he wants to defer this. Continue ASA and beta blocker Rx.  2. Hyperlipidemia. Intolerant to statins and niacin. Continue Zetia and fish oil. Check fasting lab today.  3. Psoriatic arthritis.  4. Obesity. Instructed him on dietary modification. Regular aerobic exercise.

## 2013-03-04 NOTE — Patient Instructions (Signed)
We will get lab work today  Get to work on [employer] and daily aerobic exercise 30-45 minutes. Get the weight off.  Continue your current therapy

## 2013-03-27 ENCOUNTER — Other Ambulatory Visit: Payer: Self-pay | Admitting: Cardiology

## 2013-03-28 ENCOUNTER — Other Ambulatory Visit: Payer: Self-pay | Admitting: Cardiology

## 2013-03-30 NOTE — Telephone Encounter (Signed)
bisoprolol (ZEBETA) 5 MG tablet  TAKE 1 TABLET BY MOUTH ONCE DAILY   Patient Instructions  We will get lab work today Get to work on [employer] and daily aerobic exercise 30-45 minutes. Get the weight off. Continue your current therapy  Luanna Salk, LPN  on 10/04/9405  6:80 PM

## 2013-04-27 ENCOUNTER — Other Ambulatory Visit: Payer: Self-pay | Admitting: *Deleted

## 2013-04-27 MED ORDER — EZETIMIBE 10 MG PO TABS: ORAL_TABLET | ORAL | Status: DC

## 2013-06-03 ENCOUNTER — Other Ambulatory Visit: Payer: Self-pay | Admitting: Cardiology

## 2013-06-30 ENCOUNTER — Other Ambulatory Visit: Payer: Self-pay | Admitting: Cardiology

## 2014-01-08 ENCOUNTER — Other Ambulatory Visit: Payer: Self-pay | Admitting: *Deleted

## 2014-01-08 MED ORDER — BISOPROLOL FUMARATE 5 MG PO TABS: 5.0000 mg | ORAL_TABLET | Freq: Every day | ORAL | Status: DC

## 2014-01-12 ENCOUNTER — Other Ambulatory Visit: Payer: Self-pay | Admitting: *Deleted

## 2014-01-12 MED ORDER — FENOFIBRATE 145 MG PO TABS: 145.0000 mg | ORAL_TABLET | Freq: Every day | ORAL | Status: DC

## 2014-02-23 ENCOUNTER — Encounter: Payer: Self-pay | Admitting: Cardiology

## 2014-02-23 ENCOUNTER — Other Ambulatory Visit: Payer: Self-pay

## 2014-02-23 ENCOUNTER — Ambulatory Visit (INDEPENDENT_AMBULATORY_CARE_PROVIDER_SITE_OTHER): Payer: 59 | Admitting: Cardiology

## 2014-02-23 VITALS — BP 120/84 | HR 47 | Ht 72.0 in | Wt 252.0 lb

## 2014-02-23 DIAGNOSIS — I2510 Atherosclerotic heart disease of native coronary artery without angina pectoris: Secondary | ICD-10-CM

## 2014-02-23 DIAGNOSIS — E7800 Pure hypercholesterolemia, unspecified: Secondary | ICD-10-CM

## 2014-02-23 DIAGNOSIS — I10 Essential (primary) hypertension: Secondary | ICD-10-CM

## 2014-02-23 DIAGNOSIS — I4892 Unspecified atrial flutter: Secondary | ICD-10-CM

## 2014-02-23 NOTE — Progress Notes (Signed)
Shawn Hernandez Date of Birth: Nov 19, 1950   History of Present Illness: Shawn Hernandez is seen today for a follow up of CAD. He has a history of CAD s/p stenting of the OM in Jan. 2011. He presented at that time with chest pain.   He states he is doing very well from a cardiac standpoint. He denies any symptoms of chest pain, palpitations, or shortness of breath. He remains on Zetia. He is intolerant to statins and niacin. He is active.   Current Outpatient Prescriptions on File Prior to Visit  Medication Sig Dispense Refill  . aspirin 81 MG tablet Take 81 mg by mouth daily.     . bisoprolol (ZEBETA) 5 MG tablet Take 1 tablet (5 mg total) by mouth daily. 90 tablet 0  . ezetimibe (ZETIA) 10 MG tablet TAKE 1 TABLET BY MOUTH EVERY DAY 90 tablet 3  . fenofibrate (TRICOR) 145 MG tablet Take 1 tablet (145 mg total) by mouth daily. 90 tablet 0  . Vitamin D, Ergocalciferol, (DRISDOL) 50000 UNITS CAPS Take 2,000 Units by mouth. VITAMIN D3     No current facility-administered medications on file prior to visit.    Allergies  Allergen Reactions  . Ivp Dye [Iodinated Diagnostic Agents]   . Lisinopril     cough  . Statins     myalgias    Past Medical History  Diagnosis Date  . Coronary artery disease   . Hypercholesterolemia   . Hypertension   . Atrial flutter, paroxysmal   . Sinusitis   . Diverticulosis   . Arthritis     discomfort in hands and arms  . Allergy to iodine   . Psoriatic arthritis     Past Surgical History  Procedure Laterality Date  . Colon surgery  2001    2 feet removed re- diverticulosis  . Other surgical history      appendectomy  . Inguinal hernia repair    . Coronary stent placement  2011    sequential bare-metal    History  Smoking status  . Never Smoker   Smokeless tobacco  . Not on file    History  Alcohol Use: Not on file    Family History  Problem Relation Age of Onset  . Heart disease Mother   . Heart disease Father   . Heart disease Brother      Review of Systems: As noted in history of present illness above.  All other systems were reviewed and are negative.  Physical Exam: BP 120/84 mmHg  Pulse 47  Ht 6' (1.829 m)  Wt 252 lb (114.306 kg)  BMI 34.17 kg/m2 WD WM in NAD. The HEENT exam is normal.  The carotids are 2+ without bruits.  There is no thyromegaly.  There is no JVD.  The lungs are clear.  The chest wall is non tender.  The heart exam reveals a regular rate with a normal S1 and S2.  There are no murmurs, gallops, or rubs.  The PMI is not displaced.   Abdominal exam reveals good bowel sounds. It is obese.  There is no hepatosplenomegaly or tenderness.  There are no masses.  Exam of the legs reveal no clubbing, cyanosis, or edema.  The distal pulses are intact.  Cranial nerves II - XII are intact.  Motor and sensory functions are intact.    LABORATORY DATA:  ECG today demonstrates sinus bradycardia with a rate of 47 beats per minute. It is otherwise normal. I have personally reviewed and  interpreted this study.   Assessment / Plan: 1. CAD s/p stenting of the OM in 2011 with a BMS. He is asymptomatic. I recommend ETT but he wants to defer this. Continue ASA and beta blocker Rx.  2. Hyperlipidemia. Intolerant to statins and niacin. Continue Zetia and fish oil. Check fasting lab.  3. Psoriatic arthritis.  4. Obesity. Instructed him on dietary modification. Regular aerobic exercise.

## 2014-02-23 NOTE — Patient Instructions (Signed)
We will check fasting lab work  Continue your current therapy  I will see you in one year

## 2014-02-24 ENCOUNTER — Other Ambulatory Visit (INDEPENDENT_AMBULATORY_CARE_PROVIDER_SITE_OTHER): Payer: 59 | Admitting: *Deleted

## 2014-02-24 ENCOUNTER — Other Ambulatory Visit: Payer: 59

## 2014-02-24 DIAGNOSIS — I10 Essential (primary) hypertension: Secondary | ICD-10-CM

## 2014-02-24 DIAGNOSIS — E7800 Pure hypercholesterolemia, unspecified: Secondary | ICD-10-CM

## 2014-02-24 DIAGNOSIS — I4892 Unspecified atrial flutter: Secondary | ICD-10-CM

## 2014-02-24 DIAGNOSIS — I2510 Atherosclerotic heart disease of native coronary artery without angina pectoris: Secondary | ICD-10-CM

## 2014-02-24 LAB — BASIC METABOLIC PANEL
BUN: 15 mg/dL (ref 6–23)
CO2: 27 mEq/L (ref 19–32)
Calcium: 9.3 mg/dL (ref 8.4–10.5)
Chloride: 105 mEq/L (ref 96–112)
Creatinine, Ser: 1.22 mg/dL (ref 0.40–1.50)
GFR: 63.44 mL/min (ref >60.00)
Glucose, Bld: 88 mg/dL (ref 70–99)
Potassium: 4.1 mEq/L (ref 3.5–5.1)
Sodium: 136 mEq/L (ref 135–145)

## 2014-02-24 LAB — LIPID PANEL
Cholesterol: 155 mg/dL (ref 0–200)
HDL: 34.8 mg/dL — ABNORMAL LOW (ref >39.00)
LDL Cholesterol: 88 mg/dL (ref 0–99)
NonHDL: 120.2
Total CHOL/HDL Ratio: 4
Triglycerides: 163 mg/dL — ABNORMAL HIGH (ref 0.0–149.0)
VLDL: 32.6 mg/dL (ref 0.0–40.0)

## 2014-02-24 LAB — HEPATIC FUNCTION PANEL
ALT: 40 U/L (ref 0–53)
AST: 47 U/L — ABNORMAL HIGH (ref 0–37)
Albumin: 4.2 g/dL (ref 3.5–5.2)
Alkaline Phosphatase: 50 U/L (ref 39–117)
Bilirubin, Direct: 0.1 mg/dL (ref 0.0–0.3)
Total Bilirubin: 0.5 mg/dL (ref 0.2–1.2)
Total Protein: 6.6 g/dL (ref 6.0–8.3)

## 2014-02-24 NOTE — Addendum Note (Signed)
Addended by: Eulis Foster on: 02/24/2014 12:24 PM   Modules accepted: Orders

## 2014-04-16 ENCOUNTER — Other Ambulatory Visit: Payer: Self-pay | Admitting: *Deleted

## 2014-04-16 MED ORDER — BISOPROLOL FUMARATE 5 MG PO TABS: 5.0000 mg | ORAL_TABLET | Freq: Every day | ORAL | Status: DC

## 2014-04-21 ENCOUNTER — Telehealth: Payer: Self-pay | Admitting: Cardiology

## 2014-04-21 NOTE — Telephone Encounter (Signed)
Returned call to patient he stated he has changed insurance.Stated Zetia too expensive.Stated he will be going on medicare this year and insurance will change again.Zetia 10 mg samples left at front desk of [facility] office.

## 2014-04-21 NOTE — Telephone Encounter (Signed)
Please call,having some medication problems and needs to ask some questions.

## 2014-05-19 ENCOUNTER — Telehealth: Payer: Self-pay | Admitting: Cardiology

## 2014-05-19 ENCOUNTER — Other Ambulatory Visit: Payer: Self-pay

## 2014-05-19 MED ORDER — BISOPROLOL FUMARATE 5 MG PO TABS: 5.0000 mg | ORAL_TABLET | Freq: Every day | ORAL | Status: DC

## 2014-05-19 NOTE — Telephone Encounter (Signed)
°  1. Which medications need to be refilled? Bisoprolol-new prescription   2. Which pharmacy is medication to be sent to[facility]  3. Do they need a 30 day or 90 day supply? 30 and refills  4. Would they like a call back once the medication has been sent to the pharmacy? no

## 2014-06-02 ENCOUNTER — Other Ambulatory Visit: Payer: Self-pay

## 2014-06-02 MED ORDER — BISOPROLOL FUMARATE 5 MG PO TABS: 5.0000 mg | ORAL_TABLET | Freq: Every day | ORAL | Status: DC

## 2014-06-16 ENCOUNTER — Other Ambulatory Visit: Payer: Self-pay | Admitting: *Deleted

## 2014-06-16 MED ORDER — FENOFIBRATE 145 MG PO TABS: 145.0000 mg | ORAL_TABLET | Freq: Every day | ORAL | Status: DC

## 2014-06-16 NOTE — Telephone Encounter (Signed)
Rx refill sent to patient pharmacy   

## 2014-06-28 ENCOUNTER — Telehealth: Payer: Self-pay

## 2014-06-28 MED ORDER — FENOFIBRATE 160 MG PO TABS: 160.0000 mg | ORAL_TABLET | Freq: Every day | ORAL | Status: DC

## 2014-06-28 NOTE — Telephone Encounter (Signed)
Returned call to patient no answer.Left message on personal voice mail.Spoke to [place] about insurance not covering fenofibrate.He advised to take generic fenofibrate 160 mg daily.I sent prescription to your pharmacy.Advised to call me back if insurance does not cover.

## 2014-06-28 NOTE — Telephone Encounter (Signed)
Received call from patient.He stated he will be changing insurances 07/07/14.Stated he may want to go back to tricor 145 mg.Stated he will call back and let me know if he needs a new prescription for tricor 145 mg daily sent to pharmacy.

## 2014-07-08 ENCOUNTER — Other Ambulatory Visit: Payer: Self-pay | Admitting: *Deleted

## 2014-07-08 MED ORDER — EZETIMIBE 10 MG PO TABS: ORAL_TABLET | ORAL | Status: DC

## 2014-07-13 ENCOUNTER — Other Ambulatory Visit: Payer: Self-pay | Admitting: Cardiology

## 2014-07-13 ENCOUNTER — Other Ambulatory Visit: Payer: Self-pay | Admitting: *Deleted

## 2014-07-13 MED ORDER — FENOFIBRATE 145 MG PO TABS: 145.0000 mg | ORAL_TABLET | Freq: Every day | ORAL | Status: DC

## 2014-07-13 MED ORDER — FENOFIBRATE 160 MG PO TABS: 160.0000 mg | ORAL_TABLET | Freq: Every day | ORAL | Status: DC

## 2014-07-13 NOTE — Telephone Encounter (Signed)
Called pharmacy - called in new prescription for fenofibrate 145 mg.  Notified patient.

## 2014-07-13 NOTE — Telephone Encounter (Signed)
His Fenofibrate should be 145 mg instead of 160 mg. Please send the correct one to [facility].

## 2015-02-18 ENCOUNTER — Other Ambulatory Visit: Payer: Self-pay

## 2015-02-18 ENCOUNTER — Other Ambulatory Visit: Payer: Self-pay | Admitting: Cardiology

## 2015-02-18 MED ORDER — BISOPROLOL FUMARATE 5 MG PO TABS: 5.0000 mg | ORAL_TABLET | Freq: Every day | ORAL | Status: DC

## 2015-02-18 NOTE — Telephone Encounter (Signed)
Rx(s) sent to pharmacy electronically.  

## 2015-02-18 NOTE — Telephone Encounter (Signed)
°*  STAT* If patient is at the pharmacy, call can be transferred to refill team.   1. Which medications need to be refilled? (please list name of each medication and dose if known) Bisoprolol 5 mg   2. Which pharmacy/location (including street and city if local pharmacy) is [facility] to be sent to[facility] [address]   3. Do they need a 30 day or 90 day supply? [place]

## 2015-02-21 ENCOUNTER — Other Ambulatory Visit: Payer: Self-pay | Admitting: *Deleted

## 2015-02-21 MED ORDER — BISOPROLOL FUMARATE 5 MG PO TABS: 5.0000 mg | ORAL_TABLET | Freq: Every day | ORAL | Status: DC

## 2015-03-04 DIAGNOSIS — L821 Other seborrheic keratosis: Secondary | ICD-10-CM | POA: Diagnosis not present

## 2015-03-04 DIAGNOSIS — L218 Other seborrheic dermatitis: Secondary | ICD-10-CM | POA: Diagnosis not present

## 2015-03-04 DIAGNOSIS — D485 Neoplasm of uncertain behavior of skin: Secondary | ICD-10-CM | POA: Diagnosis not present

## 2015-03-04 DIAGNOSIS — C44612 Basal cell carcinoma of skin of right upper limb, including shoulder: Secondary | ICD-10-CM | POA: Diagnosis not present

## 2015-03-04 DIAGNOSIS — L820 Inflamed seborrheic keratosis: Secondary | ICD-10-CM | POA: Diagnosis not present

## 2015-03-04 DIAGNOSIS — L812 Freckles: Secondary | ICD-10-CM | POA: Diagnosis not present

## 2015-03-04 DIAGNOSIS — L570 Actinic keratosis: Secondary | ICD-10-CM | POA: Diagnosis not present

## 2015-03-21 ENCOUNTER — Encounter: Payer: Self-pay | Admitting: Podiatry

## 2015-03-21 ENCOUNTER — Encounter: Payer: Self-pay | Admitting: Cardiology

## 2015-03-21 ENCOUNTER — Ambulatory Visit (INDEPENDENT_AMBULATORY_CARE_PROVIDER_SITE_OTHER): Payer: Medicare Other | Admitting: Podiatry

## 2015-03-21 ENCOUNTER — Ambulatory Visit (INDEPENDENT_AMBULATORY_CARE_PROVIDER_SITE_OTHER): Payer: Medicare Other | Admitting: Cardiology

## 2015-03-21 ENCOUNTER — Ambulatory Visit (INDEPENDENT_AMBULATORY_CARE_PROVIDER_SITE_OTHER): Payer: Medicare Other

## 2015-03-21 VITALS — BP 124/80 | HR 58 | Ht 72.0 in | Wt 255.3 lb

## 2015-03-21 VITALS — BP 163/84 | HR 59 | Resp 14

## 2015-03-21 DIAGNOSIS — E7800 Pure hypercholesterolemia, unspecified: Secondary | ICD-10-CM

## 2015-03-21 DIAGNOSIS — I2510 Atherosclerotic heart disease of native coronary artery without angina pectoris: Secondary | ICD-10-CM | POA: Diagnosis not present

## 2015-03-21 DIAGNOSIS — M216X9 Other acquired deformities of unspecified foot: Secondary | ICD-10-CM | POA: Diagnosis not present

## 2015-03-21 DIAGNOSIS — I10 Essential (primary) hypertension: Secondary | ICD-10-CM

## 2015-03-21 DIAGNOSIS — M79671 Pain in right foot: Secondary | ICD-10-CM

## 2015-03-21 DIAGNOSIS — M7741 Metatarsalgia, right foot: Secondary | ICD-10-CM

## 2015-03-21 NOTE — Progress Notes (Signed)
Shawn Hernandez Date of Birth: 06/08/1950   History of Present Illness: Shawn Hernandez is seen today for a follow up of CAD. He has a history of CAD s/p stenting of the OM in Jan. 2011. He presented at that time with chest pain.    On follow up today  he is doing very well from a cardiac standpoint. He denies any symptoms of chest pain, palpitations, or shortness of breath. He had a couple of episodes of minor palpitations lasting only seconds. HR 65-70 at the time. BP has been well controlled. Trying to stay active but has issues with metatarsalgia.   Current Outpatient Prescriptions on File Prior to Visit  Medication Sig Dispense Refill  . aspirin 81 MG tablet Take 81 mg by mouth daily.     . bisoprolol (ZEBETA) 5 MG tablet Take 1 tablet (5 mg total) by mouth daily. 90 tablet 0  . ezetimibe (ZETIA) 10 MG tablet TAKE 1 TABLET BY MOUTH EVERY DAY 90 tablet 3  . fenofibrate (TRICOR) 145 MG tablet Take 1 tablet (145 mg total) by mouth daily. 90 tablet 2   No current facility-administered medications on file prior to visit.    Allergies  Allergen Reactions  . Ivp Dye [Iodinated Diagnostic Agents]   . Lisinopril     cough  . Statins     myalgias    Past Medical History  Diagnosis Date  . Coronary artery disease   . Hypercholesterolemia   . Hypertension   . Atrial flutter, paroxysmal ([place])   . Sinusitis   . Diverticulosis   . Arthritis     discomfort in hands and arms  . Allergy to iodine   . Psoriatic arthritis [facility])     Past Surgical History  Procedure Laterality Date  . Colon surgery  2001    2 feet removed re- diverticulosis  . Other surgical history      appendectomy  . Inguinal hernia repair    . Coronary stent placement  2011    sequential bare-metal    History  Smoking status  . Never Smoker   Smokeless tobacco  . Not on file    History  Alcohol Use: Not on file    Family History  Problem Relation Age of Onset  . Heart disease Mother   . Heart disease  Father   . Heart disease Brother     Review of Systems: As noted in history of present illness above.  All other systems were reviewed and are negative.  Physical Exam: BP 124/80 mmHg  Pulse 58  Ht 6' (1.829 m)  Wt 115.809 kg (255 lb 5 oz)  BMI 34.62 kg/m2 WD WM in NAD. The HEENT exam is normal.  The carotids are 2+ without bruits.  There is no thyromegaly.  There is no JVD.  The lungs are clear.  The chest wall is non tender.  The heart exam reveals a regular rate with a normal S1 and S2.  There are no murmurs, gallops, or rubs.  The PMI is not displaced.   Abdominal exam reveals good bowel sounds. It is obese.  There are no masses.  Exam of the legs reveal no clubbing, cyanosis, or edema.  The distal pulses are intact.  Cranial nerves II - XII are intact.  Motor and sensory functions are intact.    LABORATORY DATA:  ECG today demonstrates sinus bradycardia with a rate of 58beats per minute. It is otherwise normal. I have personally reviewed and interpreted  this study.   Assessment / Plan: 1. CAD s/p stenting of the OM in 2011 with a BMS. He is asymptomatic. Discussed follow up stress testing but currently he is unable to walk on a treadmill. Will defer for now since he is asymptomatic.   2. Hyperlipidemia. Intolerant to statins and niacin. Continue Zetia and fenofibrate.  Check fasting lab.  3. Psoriatic arthritis.  4. Obesity. Instructed him on dietary modification. Regular aerobic exercise.

## 2015-03-21 NOTE — Patient Instructions (Signed)
We will check lab work today  Continue your current therapy  I will see you in one year. 

## 2015-03-21 NOTE — Progress Notes (Signed)
   Subjective:    Patient ID: Shawn Hernandez, male    DOB: 03/17/1950, 65 y.o.   MRN: [id number]  HPI The patient presents here today with right ball of foot pain. He states he has pain in the ball of his foot after he stands on concrete floors as he works at [employer].He states he does not have pain in the week we is not working however he only has pain when he stands at work. Denies any injury, denies any swelling. He does get some intermittent numbness to his right foot on the toes. He states the Moses over the third, fourth, fifth toes. He did have an injury possibly tended 15 years ago and felt a letter but he did not seek treatment at that time. No other complaints at this time.    Review of Systems  Constitutional:       Sinus problems  HENT: Positive for hearing loss.   Respiratory: Positive for cough.   Neurological: Positive for dizziness.       Objective:   Physical Exam General: AAO x3, NAD  Dermatological: Skin is warm, dry and supple bilateral. Nails x 10 are well manicured; remaining integument appears unremarkable at this time. There are no open sores, no preulcerative lesions, no rash or signs of infection present.  Vascular: Dorsalis Pedis artery and Posterior Tibial artery pedal pulses are 2/4 bilateral with immedate capillary fill time. Pedal hair growth present. No varicosities and no lower extremity edema present bilateral. There is no pain with calf compression, swelling, warmth, erythema.   Neruologic: Sensation intact with Derrel Nip monofilament bilaterally however vibratory sensation slightly decreased on the right side on the first metatarsal however is intact of the ankle.  Musculoskeletal: Hammertoe contractures and HAV is present and there is prominence the metatarsal heads with atrophy of the fat pad. There is diffuse tenderness to metatarsal heads 1-5 on the right foot plantarly. There is no pinpoint bony tenderness or pain the vibratory sensation. There  is no palpable neuroma and there is no clicking sensation. There is no pain the interspaces. MMT 5/5, ROM WNL.  Gait: Unassisted, Nonantalgic.     Assessment & Plan:  65 year old male right foot metatarsalgia due to prominence of metatarsal heads -Treatment options discussed including all alternatives, risks, and complications -Etiology of symptoms were discussed -X-rays were obtained and reviewed with the patient.  -Metatarsal pads were dispensed. Discussed orthotics.  -Discussed neurology evaluation further workup for the numbness to his right foot however he wished to hold off. He does have back issues and recommend follow-up at his primary care physician. -Follow-up in 4 weeks if symptoms continue or sooner if any problems arise. In the meantime, encouraged to call the office with any questions, concerns, change in symptoms.   Celesta Gentile, DPM

## 2015-03-23 ENCOUNTER — Other Ambulatory Visit (INDEPENDENT_AMBULATORY_CARE_PROVIDER_SITE_OTHER): Payer: Medicare Other | Admitting: *Deleted

## 2015-03-23 DIAGNOSIS — I10 Essential (primary) hypertension: Secondary | ICD-10-CM

## 2015-03-23 DIAGNOSIS — E7800 Pure hypercholesterolemia, unspecified: Secondary | ICD-10-CM

## 2015-03-23 DIAGNOSIS — I2510 Atherosclerotic heart disease of native coronary artery without angina pectoris: Secondary | ICD-10-CM | POA: Diagnosis not present

## 2015-03-23 LAB — BASIC METABOLIC PANEL
BUN: 13 mg/dL (ref 7–25)
CO2: 23 mmol/L (ref 20–31)
Calcium: 9.3 mg/dL (ref 8.6–10.3)
Chloride: 104 mmol/L (ref 98–110)
Creat: 1.23 mg/dL (ref 0.70–1.25)
Glucose, Bld: 127 mg/dL — ABNORMAL HIGH (ref 65–99)
Potassium: 4.4 mmol/L (ref 3.5–5.3)
Sodium: 137 mmol/L (ref 135–146)

## 2015-03-23 LAB — HEPATIC FUNCTION PANEL
ALT: 39 U/L (ref 9–46)
AST: 43 U/L — ABNORMAL HIGH (ref 10–35)
Albumin: 4.2 g/dL (ref 3.6–5.1)
Alkaline Phosphatase: 61 U/L (ref 40–115)
Bilirubin, Direct: 0.2 mg/dL (ref <=0.2)
Indirect Bilirubin: 0.4 mg/dL (ref 0.2–1.2)
Total Bilirubin: 0.6 mg/dL (ref 0.2–1.2)
Total Protein: 7.1 g/dL (ref 6.1–8.1)

## 2015-03-23 LAB — LIPID PANEL
Cholesterol: 145 mg/dL (ref 125–200)
HDL: 27 mg/dL — ABNORMAL LOW (ref >=40)
LDL Cholesterol: 95 mg/dL (ref <130)
Total CHOL/HDL Ratio: 5.4 Ratio — ABNORMAL HIGH (ref <=5.0)
Triglycerides: 113 mg/dL (ref <150)
VLDL: 23 mg/dL (ref <30)

## 2015-04-12 DIAGNOSIS — R197 Diarrhea, unspecified: Secondary | ICD-10-CM | POA: Diagnosis not present

## 2015-04-12 DIAGNOSIS — I10 Essential (primary) hypertension: Secondary | ICD-10-CM | POA: Diagnosis not present

## 2015-04-12 DIAGNOSIS — K5732 Diverticulitis of large intestine without perforation or abscess without bleeding: Secondary | ICD-10-CM | POA: Diagnosis not present

## 2015-04-12 DIAGNOSIS — R1032 Left lower quadrant pain: Secondary | ICD-10-CM | POA: Diagnosis not present

## 2015-07-03 ENCOUNTER — Other Ambulatory Visit: Payer: Self-pay | Admitting: Cardiology

## 2015-07-12 DIAGNOSIS — I2510 Atherosclerotic heart disease of native coronary artery without angina pectoris: Secondary | ICD-10-CM | POA: Diagnosis not present

## 2015-07-12 DIAGNOSIS — I10 Essential (primary) hypertension: Secondary | ICD-10-CM | POA: Diagnosis not present

## 2015-07-19 DIAGNOSIS — R0602 Shortness of breath: Secondary | ICD-10-CM | POA: Diagnosis not present

## 2015-07-19 DIAGNOSIS — M545 Low back pain: Secondary | ICD-10-CM | POA: Diagnosis not present

## 2015-07-19 DIAGNOSIS — R0789 Other chest pain: Secondary | ICD-10-CM | POA: Diagnosis not present

## 2015-07-19 DIAGNOSIS — E782 Mixed hyperlipidemia: Secondary | ICD-10-CM | POA: Diagnosis not present

## 2015-07-20 ENCOUNTER — Other Ambulatory Visit (HOSPITAL_COMMUNITY): Payer: Self-pay | Admitting: Respiratory Therapy

## 2015-07-20 DIAGNOSIS — R0602 Shortness of breath: Secondary | ICD-10-CM

## 2015-08-15 ENCOUNTER — Other Ambulatory Visit: Payer: Self-pay | Admitting: Cardiology

## 2015-08-15 NOTE — Telephone Encounter (Signed)
REFILL 

## 2015-08-17 DIAGNOSIS — R0789 Other chest pain: Secondary | ICD-10-CM | POA: Diagnosis not present

## 2015-08-17 DIAGNOSIS — M545 Low back pain: Secondary | ICD-10-CM | POA: Diagnosis not present

## 2015-09-04 ENCOUNTER — Other Ambulatory Visit: Payer: Self-pay | Admitting: Cardiology

## 2015-12-09 ENCOUNTER — Telehealth: Payer: Self-pay | Admitting: Cardiology

## 2015-12-09 NOTE — Telephone Encounter (Signed)
New message    Pt verbalized that he is currently taking Fenofibrate  145mg  can pt go to 160 mg?

## 2015-12-09 NOTE — Telephone Encounter (Signed)
Patient wanted to know if it would be difficult to switch fenofibrate doses - jan 2018. He is switch Veterinary surgeon. 145 mg and 160 mg are on different formulary tiers. RN informed patient it should not be an issue, will have to get authorization from Dr Martinique. Please contact office  When ready to switch in jan 2018 Patient verbalized understanding.

## 2015-12-13 DIAGNOSIS — I2510 Atherosclerotic heart disease of native coronary artery without angina pectoris: Secondary | ICD-10-CM | POA: Diagnosis not present

## 2015-12-13 DIAGNOSIS — I10 Essential (primary) hypertension: Secondary | ICD-10-CM | POA: Diagnosis not present

## 2015-12-13 DIAGNOSIS — E782 Mixed hyperlipidemia: Secondary | ICD-10-CM | POA: Diagnosis not present

## 2015-12-13 DIAGNOSIS — Z0000 Encounter for general adult medical examination without abnormal findings: Secondary | ICD-10-CM | POA: Diagnosis not present

## 2015-12-20 ENCOUNTER — Other Ambulatory Visit: Payer: Self-pay | Admitting: Internal Medicine

## 2015-12-20 DIAGNOSIS — E782 Mixed hyperlipidemia: Secondary | ICD-10-CM | POA: Diagnosis not present

## 2015-12-20 DIAGNOSIS — M5440 Lumbago with sciatica, unspecified side: Secondary | ICD-10-CM | POA: Diagnosis not present

## 2015-12-20 DIAGNOSIS — L4050 Arthropathic psoriasis, unspecified: Secondary | ICD-10-CM | POA: Diagnosis not present

## 2015-12-20 DIAGNOSIS — I10 Essential (primary) hypertension: Secondary | ICD-10-CM | POA: Diagnosis not present

## 2015-12-20 DIAGNOSIS — M545 Low back pain: Secondary | ICD-10-CM | POA: Diagnosis not present

## 2015-12-20 DIAGNOSIS — I2510 Atherosclerotic heart disease of native coronary artery without angina pectoris: Secondary | ICD-10-CM | POA: Diagnosis not present

## 2015-12-25 DIAGNOSIS — Z1212 Encounter for screening for malignant neoplasm of rectum: Secondary | ICD-10-CM | POA: Diagnosis not present

## 2015-12-25 DIAGNOSIS — Z1211 Encounter for screening for malignant neoplasm of colon: Secondary | ICD-10-CM | POA: Diagnosis not present

## 2015-12-28 ENCOUNTER — Ambulatory Visit
Admission: RE | Admit: 2015-12-28 | Discharge: 2015-12-28 | Disposition: A | Discharge status: Home / Self Care | Payer: Medicare Other | Source: Ambulatory Visit | Attending: Internal Medicine | Admitting: Internal Medicine

## 2015-12-28 DIAGNOSIS — M5440 Lumbago with sciatica, unspecified side: Secondary | ICD-10-CM

## 2015-12-28 DIAGNOSIS — M5126 Other intervertebral disc displacement, lumbar region: Secondary | ICD-10-CM | POA: Diagnosis not present

## 2016-01-16 DIAGNOSIS — R195 Other fecal abnormalities: Secondary | ICD-10-CM | POA: Diagnosis not present

## 2016-01-16 DIAGNOSIS — K5730 Diverticulosis of large intestine without perforation or abscess without bleeding: Secondary | ICD-10-CM | POA: Diagnosis not present

## 2016-01-16 DIAGNOSIS — I2510 Atherosclerotic heart disease of native coronary artery without angina pectoris: Secondary | ICD-10-CM | POA: Diagnosis not present

## 2016-02-05 ENCOUNTER — Other Ambulatory Visit: Payer: Self-pay | Admitting: Cardiology

## 2016-02-07 NOTE — Telephone Encounter (Signed)
Rx request sent to pharmacy.  

## 2016-02-28 DIAGNOSIS — K5730 Diverticulosis of large intestine without perforation or abscess without bleeding: Secondary | ICD-10-CM | POA: Diagnosis not present

## 2016-02-28 DIAGNOSIS — R195 Other fecal abnormalities: Secondary | ICD-10-CM | POA: Diagnosis not present

## 2016-02-28 DIAGNOSIS — D122 Benign neoplasm of ascending colon: Secondary | ICD-10-CM | POA: Diagnosis not present

## 2016-02-28 DIAGNOSIS — K635 Polyp of colon: Secondary | ICD-10-CM | POA: Diagnosis not present

## 2016-03-09 ENCOUNTER — Other Ambulatory Visit: Payer: Self-pay | Admitting: Cardiology

## 2016-03-09 MED ORDER — EZETIMIBE 10 MG PO TABS: 10.0000 mg | ORAL_TABLET | Freq: Every day | ORAL | 0 refills | Status: DC

## 2016-03-29 NOTE — Progress Notes (Signed)
Shawn Hernandez Date of Birth: 1950-09-20   History of Present Illness: Shawn Hernandez is seen today for a follow up of CAD. He has a history of CAD s/p stenting of the OM in Jan. 2011. He presented at that time with chest pain.    On follow up today  he is doing very well from a cardiac standpoint. He denies any symptoms of chest pain, palpitations, or shortness of breath. BP has been well controlled. Does enjoy playing golf, bowling and he does some weight lifting.   Current Outpatient Prescriptions on File Prior to Visit  Medication Sig Dispense Refill  . aspirin 81 MG tablet Take 81 mg by mouth daily.     . bisoprolol (ZEBETA) 5 MG tablet TAKE 1 TABLET EVERY DAY 90 tablet 1  . Cholecalciferol (VITAMIN D) 2000 units CAPS Take 1 capsule by mouth daily.    Marland Kitchen ezetimibe (ZETIA) 10 MG tablet Take 1 tablet (10 mg total) by mouth daily. 90 tablet 0  . fenofibrate (TRICOR) 145 MG tablet TAKE 1 TABLET BY MOUTH ONCE DAILY 90 tablet 1   No current facility-administered medications on file prior to visit.     Allergies  Allergen Reactions  . Ivp Dye [Iodinated Diagnostic Agents]   . Lisinopril     cough  . Statins     myalgias    Past Medical History:  Diagnosis Date  . Allergy to iodine   . Arthritis    discomfort in hands and arms  . Atrial flutter, paroxysmal ([place])   . Coronary artery disease   . Diverticulosis   . Hypercholesterolemia   . Hypertension   . Psoriatic arthritis ([place])   . Sinusitis     Past Surgical History:  Procedure Laterality Date  . COLON SURGERY  2001   2 feet removed re- diverticulosis  . CORONARY STENT PLACEMENT  2011   sequential bare-metal  . INGUINAL HERNIA REPAIR    . OTHER SURGICAL HISTORY     appendectomy    History  Smoking Status  . Never Smoker  Smokeless Tobacco  . Never Used    History  Alcohol use Not on file    Family History  Problem Relation Age of Onset  . Heart disease Mother   . Heart disease Father   . Heart disease  Brother     Review of Systems: As noted in history of present illness above.  All other systems were reviewed and are negative.  Physical Exam: BP 130/78   Pulse (!) 57   Ht 6' (1.829 m)   Wt 256 lb (116.1 kg)   BMI 34.72 kg/m  WD WM in NAD. The HEENT exam is normal.  The carotids are 2+ without bruits.  There is no thyromegaly.  There is no JVD.  The lungs are clear.  The chest wall is non tender.  The heart exam reveals a regular rate with a normal S1 and S2.  There are no murmurs, gallops, or rubs.  The PMI is not displaced.   Abdominal exam reveals good bowel sounds. It is obese.  There are no masses.  Exam of the legs reveal no clubbing, cyanosis, or edema.  The distal pulses are intact.  Cranial nerves II - XII are intact.  Motor and sensory functions are intact.    LABORATORY DATA: Lab Results  Component Value Date   WBC 10.9 (H) 05/14/2009   HGB 15.7 05/14/2009   HCT 45.8 05/14/2009   PLT 285 05/14/2009  GLUCOSE 127 (H) 03/23/2015   CHOL 145 03/23/2015   TRIG 113 03/23/2015   HDL 27 (L) 03/23/2015   LDLDIRECT 107.6 08/10/2011   LDLCALC 95 03/23/2015   ALT 39 03/23/2015   AST 43 (H) 03/23/2015   NA 137 03/23/2015   K 4.4 03/23/2015   CL 104 03/23/2015   CREATININE 1.23 03/23/2015   BUN 13 03/23/2015   CO2 23 03/23/2015   TSH 4.865 Test methodology is 3rd generation TSH (H) 05/14/2009   Labs dated 12/13/15: cholesterol 192, triglycerides 170, HDL 36, LDL 122. CMET, TSH, PSA normal.   ECG today demonstrates sinus bradycardia with a rate of 57 beats per minute. Nonspecific T wave abnormality.  I have personally reviewed and interpreted this study.   Assessment / Plan: 1. CAD s/p stenting of the OM in 2011 with a BMS. He is asymptomatic. Discussed follow up stress testing but in the absence of symptoms he is really not interested.  Will defer for now since he is asymptomatic.   2. Hyperlipidemia. Intolerant to statins and niacin. Continue Zetia and fenofibrate.   Unable to afford a PCSK 9 inhibitor on current insurance.  3. Psoriatic arthritis.  4. Obesity. Instructed him on dietary modification with more of a plant based diet. increase aerobic exercise.  I will follow up in one year.

## 2016-03-30 ENCOUNTER — Encounter: Payer: Self-pay | Admitting: Cardiology

## 2016-03-30 ENCOUNTER — Ambulatory Visit (INDEPENDENT_AMBULATORY_CARE_PROVIDER_SITE_OTHER): Payer: PPO | Admitting: Cardiology

## 2016-03-30 VITALS — BP 130/78 | HR 57 | Ht 72.0 in | Wt 256.0 lb

## 2016-03-30 DIAGNOSIS — I2510 Atherosclerotic heart disease of native coronary artery without angina pectoris: Secondary | ICD-10-CM

## 2016-03-30 DIAGNOSIS — E7800 Pure hypercholesterolemia, unspecified: Secondary | ICD-10-CM | POA: Diagnosis not present

## 2016-03-30 DIAGNOSIS — I10 Essential (primary) hypertension: Secondary | ICD-10-CM

## 2016-03-30 NOTE — Patient Instructions (Signed)
Continue your current therapy  I will see you in one year   

## 2016-06-06 ENCOUNTER — Other Ambulatory Visit: Payer: Self-pay | Admitting: Cardiology

## 2016-06-22 ENCOUNTER — Other Ambulatory Visit: Payer: Self-pay | Admitting: Cardiology

## 2016-08-17 ENCOUNTER — Other Ambulatory Visit: Payer: Self-pay | Admitting: Cardiology

## 2016-09-25 DIAGNOSIS — I2510 Atherosclerotic heart disease of native coronary artery without angina pectoris: Secondary | ICD-10-CM | POA: Diagnosis not present

## 2016-09-25 DIAGNOSIS — L4050 Arthropathic psoriasis, unspecified: Secondary | ICD-10-CM | POA: Diagnosis not present

## 2016-09-25 DIAGNOSIS — K219 Gastro-esophageal reflux disease without esophagitis: Secondary | ICD-10-CM | POA: Diagnosis not present

## 2016-09-25 DIAGNOSIS — E782 Mixed hyperlipidemia: Secondary | ICD-10-CM | POA: Diagnosis not present

## 2016-09-25 DIAGNOSIS — R490 Dysphonia: Secondary | ICD-10-CM | POA: Diagnosis not present

## 2016-10-06 DIAGNOSIS — E119 Type 2 diabetes mellitus without complications: Secondary | ICD-10-CM

## 2016-10-06 HISTORY — DX: Type 2 diabetes mellitus without complications: E11.9

## 2016-10-19 ENCOUNTER — Encounter (HOSPITAL_COMMUNITY): Payer: Self-pay | Admitting: *Deleted

## 2016-10-19 ENCOUNTER — Telehealth: Payer: Self-pay | Admitting: Cardiology

## 2016-10-19 ENCOUNTER — Inpatient Hospital Stay (HOSPITAL_COMMUNITY)
Admission: EM | Admit: 2016-10-19 | Discharge: 2016-10-21 | DRG: 638 | Disposition: A | Discharge status: Home / Self Care | Payer: PPO | Attending: Family Medicine | Admitting: Family Medicine

## 2016-10-19 DIAGNOSIS — E1110 Type 2 diabetes mellitus with ketoacidosis without coma: Secondary | ICD-10-CM | POA: Diagnosis not present

## 2016-10-19 DIAGNOSIS — N179 Acute kidney failure, unspecified: Secondary | ICD-10-CM | POA: Diagnosis not present

## 2016-10-19 DIAGNOSIS — E7800 Pure hypercholesterolemia, unspecified: Secondary | ICD-10-CM | POA: Diagnosis not present

## 2016-10-19 DIAGNOSIS — Z955 Presence of coronary angioplasty implant and graft: Secondary | ICD-10-CM

## 2016-10-19 DIAGNOSIS — E872 Acidosis, unspecified: Secondary | ICD-10-CM

## 2016-10-19 DIAGNOSIS — K3184 Gastroparesis: Secondary | ICD-10-CM | POA: Diagnosis present

## 2016-10-19 DIAGNOSIS — R112 Nausea with vomiting, unspecified: Secondary | ICD-10-CM | POA: Diagnosis not present

## 2016-10-19 DIAGNOSIS — I2510 Atherosclerotic heart disease of native coronary artery without angina pectoris: Secondary | ICD-10-CM | POA: Diagnosis not present

## 2016-10-19 DIAGNOSIS — I152 Hypertension secondary to endocrine disorders: Secondary | ICD-10-CM | POA: Diagnosis present

## 2016-10-19 DIAGNOSIS — I480 Paroxysmal atrial fibrillation: Secondary | ICD-10-CM | POA: Diagnosis present

## 2016-10-19 DIAGNOSIS — E871 Hypo-osmolality and hyponatremia: Secondary | ICD-10-CM | POA: Diagnosis present

## 2016-10-19 DIAGNOSIS — Z888 Allergy status to other drugs, medicaments and biological substances status: Secondary | ICD-10-CM | POA: Diagnosis not present

## 2016-10-19 DIAGNOSIS — Z7982 Long term (current) use of aspirin: Secondary | ICD-10-CM

## 2016-10-19 DIAGNOSIS — J9811 Atelectasis: Secondary | ICD-10-CM | POA: Diagnosis not present

## 2016-10-19 DIAGNOSIS — I10 Essential (primary) hypertension: Secondary | ICD-10-CM | POA: Diagnosis not present

## 2016-10-19 DIAGNOSIS — E1159 Type 2 diabetes mellitus with other circulatory complications: Secondary | ICD-10-CM | POA: Diagnosis present

## 2016-10-19 DIAGNOSIS — Z91041 Radiographic dye allergy status: Secondary | ICD-10-CM | POA: Diagnosis not present

## 2016-10-19 DIAGNOSIS — E860 Dehydration: Secondary | ICD-10-CM | POA: Diagnosis not present

## 2016-10-19 DIAGNOSIS — Z79899 Other long term (current) drug therapy: Secondary | ICD-10-CM

## 2016-10-19 DIAGNOSIS — L4050 Arthropathic psoriasis, unspecified: Secondary | ICD-10-CM | POA: Diagnosis not present

## 2016-10-19 DIAGNOSIS — K760 Fatty (change of) liver, not elsewhere classified: Secondary | ICD-10-CM | POA: Diagnosis not present

## 2016-10-19 DIAGNOSIS — E119 Type 2 diabetes mellitus without complications: Secondary | ICD-10-CM | POA: Diagnosis present

## 2016-10-19 DIAGNOSIS — E1143 Type 2 diabetes mellitus with diabetic autonomic (poly)neuropathy: Secondary | ICD-10-CM | POA: Diagnosis not present

## 2016-10-19 DIAGNOSIS — K7689 Other specified diseases of liver: Secondary | ICD-10-CM | POA: Diagnosis not present

## 2016-10-19 DIAGNOSIS — E1310 Other specified diabetes mellitus with ketoacidosis without coma: Secondary | ICD-10-CM | POA: Diagnosis not present

## 2016-10-19 LAB — HEPATIC FUNCTION PANEL
ALT: 19 U/L (ref 17–63)
AST: 20 U/L (ref 15–41)
Albumin: 3.5 g/dL (ref 3.5–5.0)
Alkaline Phosphatase: 76 U/L (ref 38–126)
Bilirubin, Direct: 0.2 mg/dL (ref 0.1–0.5)
Indirect Bilirubin: 1.2 mg/dL — ABNORMAL HIGH (ref 0.3–0.9)
Total Bilirubin: 1.4 mg/dL — ABNORMAL HIGH (ref 0.3–1.2)
Total Protein: 6 g/dL — ABNORMAL LOW (ref 6.5–8.1)

## 2016-10-19 LAB — CBC
HCT: 46.2 % (ref 39.0–52.0)
Hemoglobin: 15.7 g/dL (ref 13.0–17.0)
MCH: 31.5 pg (ref 26.0–34.0)
MCHC: 34 g/dL (ref 30.0–36.0)
MCV: 92.6 fL (ref 78.0–100.0)
Platelets: 265 10*3/uL (ref 150–400)
RBC: 4.99 MIL/uL (ref 4.22–5.81)
RDW: 12.5 % (ref 11.5–15.5)
WBC: 11.6 10*3/uL — ABNORMAL HIGH (ref 4.0–10.5)

## 2016-10-19 LAB — BASIC METABOLIC PANEL
Anion gap: 21 — ABNORMAL HIGH (ref 5–15)
Anion gap: 12 (ref 5–15)
Anion gap: 9 (ref 5–15)
BUN: 16 mg/dL (ref 6–20)
BUN: 13 mg/dL (ref 6–20)
BUN: 14 mg/dL (ref 6–20)
CO2: 12 mmol/L — ABNORMAL LOW (ref 22–32)
CO2: 19 mmol/L — ABNORMAL LOW (ref 22–32)
CO2: 22 mmol/L (ref 22–32)
Calcium: 9.1 mg/dL (ref 8.9–10.3)
Calcium: 8.4 mg/dL — ABNORMAL LOW (ref 8.9–10.3)
Calcium: 8.5 mg/dL — ABNORMAL LOW (ref 8.9–10.3)
Chloride: 99 mmol/L — ABNORMAL LOW (ref 101–111)
Chloride: 104 mmol/L (ref 101–111)
Chloride: 105 mmol/L (ref 101–111)
Creatinine, Ser: 1.39 mg/dL — ABNORMAL HIGH (ref 0.61–1.24)
Creatinine, Ser: 1.2 mg/dL (ref 0.61–1.24)
Creatinine, Ser: 1.18 mg/dL (ref 0.61–1.24)
GFR calc Af Amer: 59 mL/min — ABNORMAL LOW (ref >60)
GFR calc Af Amer: >60 mL/min (ref >60)
GFR calc Af Amer: >60 mL/min (ref >60)
GFR calc non Af Amer: 51 mL/min — ABNORMAL LOW (ref >60)
GFR calc non Af Amer: >60 mL/min (ref >60)
GFR calc non Af Amer: >60 mL/min (ref >60)
Glucose, Bld: 323 mg/dL — ABNORMAL HIGH (ref 65–99)
Glucose, Bld: 219 mg/dL — ABNORMAL HIGH (ref 65–99)
Glucose, Bld: 135 mg/dL — ABNORMAL HIGH (ref 65–99)
Potassium: 4.5 mmol/L (ref 3.5–5.1)
Potassium: 4.3 mmol/L (ref 3.5–5.1)
Potassium: 3.8 mmol/L (ref 3.5–5.1)
Sodium: 132 mmol/L — ABNORMAL LOW (ref 135–145)
Sodium: 135 mmol/L (ref 135–145)
Sodium: 136 mmol/L (ref 135–145)

## 2016-10-19 LAB — GLUCOSE, CAPILLARY
Glucose-Capillary: 228 mg/dL — ABNORMAL HIGH (ref 65–99)
Glucose-Capillary: 206 mg/dL — ABNORMAL HIGH (ref 65–99)
Glucose-Capillary: 205 mg/dL — ABNORMAL HIGH (ref 65–99)
Glucose-Capillary: 183 mg/dL — ABNORMAL HIGH (ref 65–99)
Glucose-Capillary: 166 mg/dL — ABNORMAL HIGH (ref 65–99)
Glucose-Capillary: 112 mg/dL — ABNORMAL HIGH (ref 65–99)

## 2016-10-19 LAB — URINALYSIS, ROUTINE W REFLEX MICROSCOPIC
Bacteria, UA: NONE SEEN
Bilirubin Urine: NEGATIVE
Glucose, UA: >=500 mg/dL — AB
Hgb urine dipstick: NEGATIVE
Ketones, ur: 80 mg/dL — AB
Leukocytes, UA: NEGATIVE
Nitrite: NEGATIVE
Protein, ur: NEGATIVE mg/dL
RBC / HPF: NONE SEEN RBC/hpf (ref 0–5)
Specific Gravity, Urine: 1.032 — ABNORMAL HIGH (ref 1.005–1.030)
Squamous Epithelial / LPF: NONE SEEN
pH: 5 (ref 5.0–8.0)

## 2016-10-19 LAB — I-STAT VENOUS BLOOD GAS, ED
Acid-base deficit: 9 mmol/L — ABNORMAL HIGH (ref 0.0–2.0)
Bicarbonate: 16.2 mmol/L — ABNORMAL LOW (ref 20.0–28.0)
O2 Saturation: 42 %
TCO2: 17 mmol/L — ABNORMAL LOW (ref 22–32)
pCO2, Ven: 34 mmHg — ABNORMAL LOW (ref 44.0–60.0)
pH, Ven: 7.287 (ref 7.250–7.430)
pO2, Ven: 26 mmHg — CL (ref 32.0–45.0)

## 2016-10-19 LAB — CBG MONITORING, ED
Glucose-Capillary: 304 mg/dL — ABNORMAL HIGH (ref 65–99)
Glucose-Capillary: 252 mg/dL — ABNORMAL HIGH (ref 65–99)

## 2016-10-19 LAB — HEMOGLOBIN A1C
Hgb A1c MFr Bld: 13.6 % — ABNORMAL HIGH (ref 4.8–5.6)
Mean Plasma Glucose: 343.62 mg/dL

## 2016-10-19 LAB — BETA-HYDROXYBUTYRIC ACID: Beta-Hydroxybutyric Acid: 5.24 mmol/L — ABNORMAL HIGH (ref 0.05–0.27)

## 2016-10-19 LAB — I-STAT CG4 LACTIC ACID, ED: Lactic Acid, Venous: 2.01 mmol/L (ref 0.5–1.9)

## 2016-10-19 MED ORDER — ACETAMINOPHEN 325 MG PO TABS
650.0000 mg | ORAL_TABLET | Freq: Four times a day (QID) | ORAL | Status: DC | PRN
  Administered 2016-10-19 – 2016-10-21 (×6): 650 mg via ORAL
  Filled 2016-10-19 (×6): qty 2

## 2016-10-19 MED ORDER — SODIUM CHLORIDE 0.9 % IV BOLUS (SEPSIS)
1000.0000 mL | Freq: Once | INTRAVENOUS | Status: AC
  Administered 2016-10-19: 1000 mL via INTRAVENOUS

## 2016-10-19 MED ORDER — SODIUM CHLORIDE 0.9 % IV SOLN: INTRAVENOUS | Status: DC

## 2016-10-19 MED ORDER — SODIUM CHLORIDE 0.9 % IV SOLN
INTRAVENOUS | Status: DC
  Administered 2016-10-19 (×2): via INTRAVENOUS

## 2016-10-19 MED ORDER — DEXTROSE-NACL 5-0.45 % IV SOLN: INTRAVENOUS | Status: DC

## 2016-10-19 MED ORDER — ENOXAPARIN SODIUM 40 MG/0.4ML ~~LOC~~ SOLN
40.0000 mg | SUBCUTANEOUS | Status: DC
  Administered 2016-10-19 – 2016-10-20 (×2): 40 mg via SUBCUTANEOUS
  Filled 2016-10-19 (×2): qty 0.4

## 2016-10-19 MED ORDER — INSULIN ASPART 100 UNIT/ML ~~LOC~~ SOLN
0.0000 [IU] | SUBCUTANEOUS | Status: DC
  Administered 2016-10-20: 4 [IU] via SUBCUTANEOUS

## 2016-10-19 MED ORDER — LIVING WELL WITH DIABETES BOOK
Freq: Once | Status: DC
  Filled 2016-10-19: qty 1

## 2016-10-19 MED ORDER — INSULIN GLARGINE 100 UNIT/ML ~~LOC~~ SOLN
10.0000 [IU] | Freq: Every day | SUBCUTANEOUS | Status: DC
  Filled 2016-10-19: qty 0.1

## 2016-10-19 MED ORDER — SODIUM CHLORIDE 0.9 % IV SOLN
INTRAVENOUS | Status: DC
  Administered 2016-10-19: 3.8 [IU]/h via INTRAVENOUS
  Filled 2016-10-19: qty 1

## 2016-10-19 MED ORDER — ONDANSETRON HCL 4 MG/2ML IJ SOLN
4.0000 mg | Freq: Once | INTRAMUSCULAR | Status: AC
  Administered 2016-10-19: 4 mg via INTRAVENOUS
  Filled 2016-10-19: qty 2

## 2016-10-19 MED ORDER — BISOPROLOL FUMARATE 5 MG PO TABS
5.0000 mg | ORAL_TABLET | Freq: Every day | ORAL | Status: DC
  Administered 2016-10-19 – 2016-10-20 (×2): 5 mg via ORAL
  Filled 2016-10-19 (×3): qty 1

## 2016-10-19 MED ORDER — EZETIMIBE 10 MG PO TABS
10.0000 mg | ORAL_TABLET | Freq: Every day | ORAL | Status: DC
  Administered 2016-10-19 – 2016-10-21 (×3): 10 mg via ORAL
  Filled 2016-10-19 (×3): qty 1

## 2016-10-19 MED ORDER — DEXTROSE-NACL 5-0.45 % IV SOLN
INTRAVENOUS | Status: DC
  Administered 2016-10-19: 18:00:00 via INTRAVENOUS

## 2016-10-19 MED ORDER — POTASSIUM CHLORIDE 10 MEQ/100ML IV SOLN
10.0000 meq | INTRAVENOUS | Status: DC
  Administered 2016-10-19 (×2): 10 meq via INTRAVENOUS
  Filled 2016-10-19 (×2): qty 100

## 2016-10-19 MED ORDER — INSULIN GLARGINE 100 UNIT/ML ~~LOC~~ SOLN
10.0000 [IU] | Freq: Every day | SUBCUTANEOUS | Status: DC
  Administered 2016-10-20: 10 [IU] via SUBCUTANEOUS
  Filled 2016-10-19: qty 0.1

## 2016-10-19 MED ORDER — SODIUM CHLORIDE 0.9 % IV SOLN
INTRAVENOUS | Status: DC
  Administered 2016-10-19: 2.4 [IU]/h via INTRAVENOUS
  Filled 2016-10-19: qty 1

## 2016-10-19 MED ORDER — INSULIN GLARGINE 100 UNIT/ML ~~LOC~~ SOLN: 10.0000 [IU] | Freq: Every day | SUBCUTANEOUS | Status: DC

## 2016-10-19 MED ORDER — ONDANSETRON HCL 4 MG/2ML IJ SOLN
4.0000 mg | Freq: Four times a day (QID) | INTRAMUSCULAR | Status: DC | PRN
  Administered 2016-10-20: 4 mg via INTRAVENOUS
  Filled 2016-10-19: qty 2

## 2016-10-19 NOTE — Plan of Care (Signed)
Problem: Education: Goal: Ability to manage health-related needs will improve Outcome: Progressing Will need reinforcement.

## 2016-10-19 NOTE — Telephone Encounter (Signed)
New message   Pt having sharp pain in the middle of his back and he would like for a nurse to call him back asap.

## 2016-10-19 NOTE — ED Notes (Signed)
Admitting at bedside 

## 2016-10-19 NOTE — ED Triage Notes (Signed)
Pt has multiple complaints. Reports feeling dizzy with n/v for several days. Reports mid back pain, urinary frequency, headache and dry mouth. No acute distress is noted at triage.

## 2016-10-19 NOTE — ED Provider Notes (Signed)
Medical screening examination/treatment/procedure(s) were conducted as a shared visit with non-physician practitioner(s) and myself.  I personally evaluated the patient during the encounter. Briefly, the patient is a 66 y.o. male Who presents to the emergency department with several days of nausea/vomiting, polyuria, increased thirst, headache. Patient is afebrile with stable vital signs. Workup is concerning for New-onset diabetes with diabetic ketoacidosis. Other MUDPILES negative on history. Patient started on IV fluids, IV insulin and admitted for further workup and management.   EKG Interpretation  Date/Time:  Friday October 19 2016 12:11:35 EDT Ventricular Rate:  84 PR Interval:  164 QRS Duration: 74 QT Interval:  388 QTC Calculation: 458 R Axis:   23 Text Interpretation:  Normal sinus rhythm Normal ECG No significant change since last tracing Confirmed by Addison Lank [phone]) on 10/19/2016 1:41:25 PM           Madaline Lefeber, Grayce Sessions, MD 10/19/16 1546

## 2016-10-19 NOTE — H&P (Signed)
History and Physical    ARK AGRUSA [id number] DOB: 1950/02/25 DOA: 10/19/2016  PCP: [name], MD Patient coming from: home  Chief Complaint: nausea and vomiting  HPI: Shawn Hernandez is a 66 y.o. male with medical history significant for hypertension, diverticulosis, CAD, paroxysmal A. fib flutter, arthritis, hypercholesterolemia, react arthritis presents to the emergency department with chief complaint 4 day history headache dizziness nausea and vomiting, polydipsia with frequent urination. Initial evaluation reveals serum glucose greater than 300 carb 12 acute kidney injury. Patient never diagnosed with diabetes.  Information is obtained from the patient. He reports several days ago he was helping a friend move shortly thereafter developed gradual mid back pain. Described as an ache worse with movement. Shortly thereafter he noticed dizziness worse with movement lightheadedness headache. He also reported "dry mouth and frequent urination". He states he got up during the night for 5 times to urinate. Yesterday he developed nausea with one episode of vomiting. He denies any coffee ground emesis. He denies diarrhea. He denies dysuria hematuria. He denies constipation melena or bright red blood per rectum.   ED Course: In the emergency department he is afebrile with a blood pressure at the high end of normal. Hemodynamically stable. Not hypoxic. He is provided with 2 L of normal saline. Insulin drip is initiated.  Review of Systems: As per HPI otherwise all other systems reviewed and are negative.   Ambulatory Status: Ambulates independently and is independent with ADLs  Past Medical History:  Diagnosis Date  . Allergy to iodine   . Arthritis    discomfort in hands and arms  . Atrial flutter, paroxysmal ([place])   . Coronary artery disease   . Diverticulosis   . Hypercholesterolemia   . Hypertension   . Psoriatic arthritis ([place])   . Sinusitis     Past Surgical  History:  Procedure Laterality Date  . COLON SURGERY  2001   2 feet removed re- diverticulosis  . CORONARY STENT PLACEMENT  2011   sequential bare-metal  . INGUINAL HERNIA REPAIR    . OTHER SURGICAL HISTORY     appendectomy    Social History   Social History  . Marital status: Single    Spouse name: N/A  . Number of children: 0  . Years of education: N/A   Occupational History  .  [employer]   Social History Main Topics  . Smoking status: Never Smoker  . Smokeless tobacco: Never Used  . Alcohol use Not on file  . Drug use: Unknown  . Sexual activity: Not on file   Other Topics Concern  . Not on file   Social History Narrative  . No narrative on file   He lives at home with his son. He is a retired Hotel manager. Allergies  Allergen Reactions  . Ivp Dye [Iodinated Diagnostic Agents] Hives  . Lisinopril Other (See Comments)    cough  . Statins Other (See Comments)    myalgias    Family History  Problem Relation Age of Onset  . Heart disease Mother   . Heart disease Father   . Heart disease Brother   . Diabetes Brother     Prior to Admission medications   Medication Sig Start Date End Date Taking? Authorizing Provider  aspirin 81 MG tablet Take 81 mg by mouth daily.     [provider]  bisoprolol (ZEBETA) 5 MG tablet TAKE 1 TABLET EVERY DAY 08/17/16   Martinique, Peter M, MD  Cholecalciferol (VITAMIN D) 2000  units CAPS Take 1 capsule by mouth daily.    [provider]  ezetimibe (ZETIA) 10 MG tablet TAKE 1 TABLET (10 MG TOTAL) BY MOUTH DAILY. 06/06/16   Martinique, Peter M, MD  fenofibrate (TRICOR) 145 MG tablet TAKE 1 TABLET BY MOUTH ONCE DAILY 06/22/16   Martinique, Peter M, MD    Physical Exam: Vitals:   10/19/16 1415 10/19/16 1430 10/19/16 1445 10/19/16 1500  BP: (!) 148/83 (!) 150/85 138/67 (!) 156/77  Pulse: 71 75 77 80  Resp: 16 15 18 16   Temp:      TempSrc:      SpO2: 100% 100% 99% 98%     General:  Appears calm and comfortable In no acute  distress Eyes:  PERRL, EOMI, normal lids, iris ENT:  grossly normal hearing, lips & tongue, mucous membranes of his mouth are pink slightly dry Neck:  no LAD, masses or thyromegaly Cardiovascular:  RRR, no m/r/g. No LE edema.  Respiratory:  CTA bilaterally, no w/r/r. Normal respiratory effort. Abdomen:  soft, ntnd, obese soft positive bowel sounds throughout Skin:  no rash or induration seen on limited exam Musculoskeletal:  grossly normal tone BUE/BLE, good ROM, no bony abnormality Psychiatric:  grossly normal mood and affect, speech fluent and appropriate, AOx3 Neurologic:  CN 2-12 grossly intact, moves all extremities in coordinated fashion, sensation intact. Speech clear facial symmetry  Labs on Admission: I have personally reviewed following labs and imaging studies  CBC:  Recent Labs Lab 10/19/16 1246  WBC 11.6*  HGB 15.7  HCT 46.2  MCV 92.6  PLT 119   Basic Metabolic Panel:  Recent Labs Lab 10/19/16 1246  NA 132*  K 4.5  CL 99*  CO2 12*  GLUCOSE 323*  BUN 16  CREATININE 1.39*  CALCIUM 9.1   GFR: CrCl cannot be calculated (Unknown ideal weight.). Liver Function Tests: No results for input(s): AST, ALT, ALKPHOS, BILITOT, PROT, ALBUMIN in the last 168 hours. No results for input(s): LIPASE, AMYLASE in the last 168 hours. No results for input(s): AMMONIA in the last 168 hours. Coagulation Profile: No results for input(s): INR, PROTIME in the last 168 hours. Cardiac Enzymes: No results for input(s): CKTOTAL, CKMB, CKMBINDEX, TROPONINI in the last 168 hours. BNP (last 3 results) No results for input(s): PROBNP in the last 8760 hours. HbA1C: No results for input(s): HGBA1C in the last 72 hours. CBG:  Recent Labs Lab 10/19/16 1423  GLUCAP 304*   Lipid Profile: No results for input(s): CHOL, HDL, LDLCALC, TRIG, CHOLHDL, LDLDIRECT in the last 72 hours. Thyroid Function Tests: No results for input(s): TSH, T4TOTAL, FREET4, T3FREE, THYROIDAB in the last 72  hours. Anemia Panel: No results for input(s): VITAMINB12, FOLATE, FERRITIN, TIBC, IRON, RETICCTPCT in the last 72 hours. Urine analysis: No results found for: COLORURINE, APPEARANCEUR, LABSPEC, PHURINE, GLUCOSEU, HGBUR, BILIRUBINUR, KETONESUR, PROTEINUR, UROBILINOGEN, NITRITE, LEUKOCYTESUR  Creatinine Clearance: CrCl cannot be calculated (Unknown ideal weight.).  Sepsis Labs: @LABRCNTIP (procalcitonin:4,lacticidven:4) )No results found for this or any previous visit (from the past 240 hour(s)).   Radiological Exams on Admission: No results found.  EKG: Independently reviewed. Normal sinus rhythm Normal ECG No significant change since last tracing  Assessment/Plan Principal Problem:   DKA (diabetic ketoacidoses) (HCC) Active Problems:   Coronary artery disease   Hypertension   Nausea and vomiting   Acute kidney injury ([place])   Hyponatremia   #1. DKA. New-onset diabetes. Serum glucose 323 on admission. CO2 is 12 lactic acid 2.01. Anion gap 21.  Insulin drip  initiated in the emergency department after 2 L of normal saline given. -Admit to step down -Continue insulin drip per protocol -BMET every 4 hours per protocol -Obtain hemoglobin A1c, lipid panel, beta hydroxybutyric acid, c-petide -CT abdomen -transition IV fluids to D5NS per protocol -Transition to sliding scale and bicarbonate 20 or greater and/or CBG less than 250 per protocol and gap closes -Follow urinalysis -Diabetes coordinator consult -Will need close outpatient follow-up with his primary care provider  #2. Acute kidney injury. Creatinine 1.39. Likely related to dehydration associated with #1 -Continue vigorous IV fluids -Hold nephrotoxins -Monitor urine output -Recheck in the morning  #3. Nausea and vomiting. Likely related to gastroparesis oh see #1. -sips only -improved serum glucose -zofran  #4. Hypertension. Fair control in the emergency department. Home medications include Zebeta. -continue home  meds -monitor  5.CAD. No chest pain. ekg no acute changes.  -continue home meds    DVT prophylaxis: lovenox  Code Status: full  Family Communication: son at bedside  Disposition Plan: home  Consults called: none  Admission status: inpatient    Radene Gunning MD [facility]  If 7PM-7AM, please contact night-coverage [email or web address] Password [facility]  10/19/2016, 3:49 PM

## 2016-10-19 NOTE — ED Notes (Signed)
Attempted report 

## 2016-10-19 NOTE — ED Provider Notes (Signed)
[facility] DEPT Provider Note   CSN: [id number] Arrival date & time: 10/19/16  1158     History   Chief Complaint Chief Complaint  Patient presents with  . Back Pain  . Emesis  . Dizziness    HPI Shawn Hernandez is a 66 y.o. male with history of a flutter, CAD, diverticulosis, HLD, HTN who presents today with chief complaint gradual onset, progressively improving left-sided headache for 3 days. Associated symptoms include aching posterior neck pain, lightheadedness, nausea, vomiting, dry mouth, polydipsia, and polyuria. Pain is aching and throbbing in nature. Headache is primarily left-sided. States it has been sometime since he has had a similar headache. Denies numbness, tingling, but does endorse generalized weakness as well as unsteady gait with lightheadedness. Denies dizziness. Denies trauma or falls, but states that for 5 days ago he was moving to a new home and did a lot of heavy lifting. Endorses midthoracic right-sided back pain which is constant and aching in nature. No aggravating or alleviating factors noted. He has had multiple episodes of nonbloody nonbilious emesis as well as nausea. He has had urinary frequency but no other urinary symptoms. Denies abdominal pain, diarrhea, constipation, melena, hematochezia, or hematuria. No recent medication changes. Of note, he states that in the past week his vision has acutely improved and he no longer requires the use of his reading glasses. He denies diplopia or blurry vision. He is not a smoker and has never been a smoker, denies recreational drug use or excessive alcohol use.  The history is provided by the patient.    Past Medical History:  Diagnosis Date  . Allergy to iodine   . Arthritis    discomfort in hands and arms  . Atrial flutter, paroxysmal ([place])   . Coronary artery disease   . Diverticulosis   . Hypercholesterolemia   . Hypertension   . Psoriatic arthritis ([place])   . Sinusitis     Patient Active Problem  List   Diagnosis Date Noted  . DKA (diabetic ketoacidoses) ([place]) 10/19/2016  . Nausea and vomiting 10/19/2016  . Acute kidney injury ([place]) 10/19/2016  . Coronary artery disease   . Hypertension   . Hypercholesterolemia   . Atrial flutter, paroxysmal ([place])   . Sinusitis   . Diverticulosis   . Arthritis     Past Surgical History:  Procedure Laterality Date  . COLON SURGERY  2001   2 feet removed re- diverticulosis  . CORONARY STENT PLACEMENT  2011   sequential bare-metal  . INGUINAL HERNIA REPAIR    . OTHER SURGICAL HISTORY     appendectomy       Home Medications    Prior to Admission medications   Medication Sig Start Date End Date Taking? Authorizing Provider  acetaminophen (TYLENOL) 325 MG tablet Take 650 mg by mouth every 6 (six) hours as needed for mild pain.   Yes [provider]  bisoprolol (ZEBETA) 5 MG tablet TAKE 1 TABLET EVERY DAY 08/17/16  Yes Martinique, Peter M, MD  Cholecalciferol (VITAMIN D) 2000 units CAPS Take 1 capsule by mouth daily.   Yes [provider]  ezetimibe (ZETIA) 10 MG tablet TAKE 1 TABLET (10 MG TOTAL) BY MOUTH DAILY. 06/06/16  Yes Martinique, Peter M, MD  fenofibrate (TRICOR) 145 MG tablet TAKE 1 TABLET BY MOUTH ONCE DAILY 06/22/16  Yes Martinique, Peter M, MD    Family History Family History  Problem Relation Age of Onset  . Heart disease Mother   . Heart disease  Father   . Heart disease Brother     Social History Social History  Substance Use Topics  . Smoking status: Never Smoker  . Smokeless tobacco: Never Used  . Alcohol use Not on file     Allergies   Ivp dye [iodinated diagnostic agents]; Lisinopril; and Statins   Review of Systems Review of Systems  Constitutional: Negative for chills and fever.  Eyes: Positive for photophobia.  Respiratory: Negative for shortness of breath.   Cardiovascular: Negative for chest pain.  Gastrointestinal: Positive for nausea and vomiting. Negative for abdominal pain.  Endocrine:  Positive for polydipsia.  Genitourinary: Positive for frequency. Negative for dysuria and hematuria.  Musculoskeletal: Positive for back pain and neck pain.  Neurological: Positive for weakness, light-headedness and headaches. Negative for syncope, facial asymmetry, speech difficulty and numbness.  Psychiatric/Behavioral: Negative for confusion.  All other systems reviewed and are negative.    Physical Exam Updated Vital Signs BP (!) 156/77   Pulse 80   Temp 98.3 F (36.8 C) (Oral)   Resp 16   SpO2 98%   Physical Exam  Constitutional: He is oriented to person, place, and time. He appears well-developed and well-nourished. No distress.  HENT:  Head: Normocephalic and atraumatic.  Right Ear: External ear normal.  Left Ear: External ear normal.  Mouth/Throat: Oropharynx is clear and moist.  Eyes: Pupils are equal, round, and reactive to light. Conjunctivae and EOM are normal. Right eye exhibits no discharge. Left eye exhibits no discharge.  Neck: Normal range of motion. Neck supple. No JVD present. No tracheal deviation present.  No midline spine TTP, no paraspinal muscle tenderness, no deformity, crepitus, or step-off noted. Posterior pain/aching felt with range of motion of the neck.   Cardiovascular: Normal rate, regular rhythm, normal heart sounds and intact distal pulses.   2+ radial and DP/PT pulses bl, negative Homan's bl   Pulmonary/Chest: Effort normal and breath sounds normal. No respiratory distress. He has no wheezes. He has no rales. He exhibits no tenderness.  Abdominal: Soft. Bowel sounds are normal. He exhibits no distension. There is no tenderness.  Musculoskeletal: Normal range of motion. He exhibits no edema or tenderness.  No midline spine TTP, no paraspinal muscle tenderness, no deformity, crepitus, or step-off noted. Normal range of motion of the extremities. 5/5 strength of BUE and BLE major muscle groups. No deformity, crepitus, swelling, or tenderness on  palpation of the extremities.  Lymphadenopathy:    He has no cervical adenopathy.  Neurological: He is alert and oriented to person, place, and time. No cranial nerve deficit or sensory deficit. He exhibits normal muscle tone.  Mental Status:  Alert, thought content appropriate, able to give a coherent history. Speech fluent without evidence of aphasia. Able to follow 2 step commands without difficulty.  Cranial Nerves:  II:  Peripheral visual fields grossly normal, pupils equal, round, reactive to light III,IV, VI: ptosis not present, extra-ocular motions intact bilaterally  V,VII: smile symmetric, facial light touch sensation equal VIII: hearing grossly normal to voice  X: uvula elevates symmetrically  XI: bilateral shoulder shrug symmetric and strong XII: midline tongue extension without fassiculations Motor:  Normal tone. 5/5 strength of BUE and BLE major muscle groups including strong and equal grip strength and dorsiflexion/plantar flexion Sensory: light touch normal in all extremities. Cerebellar: normal finger-to-nose with bilateral upper extremities and normal heel to shin with BLE, no nystagmus, negative romberg Gait: normal gait and balance. Able to walk on toes and heels with ease.  CV:  2+ radial and DP/PT pulses   Skin: Skin is warm and dry. No erythema.  Psychiatric: He has a normal mood and affect. His behavior is normal.  Nursing note and vitals reviewed.    ED Treatments / Results  Labs (all labs ordered are listed, but only abnormal results are displayed) Labs Reviewed  BASIC METABOLIC PANEL - Abnormal; Notable for the following:       Result Value   Sodium 132 (*)    Chloride 99 (*)    CO2 12 (*)    Glucose, Bld 323 (*)    Creatinine, Ser 1.39 (*)    GFR calc non Af Amer 51 (*)    GFR calc Af Amer 59 (*)    Anion gap 21 (*)    All other components within normal limits  CBC - Abnormal; Notable for the following:    WBC 11.6 (*)    All other components  within normal limits  I-STAT CG4 LACTIC ACID, ED - Abnormal; Notable for the following:    Lactic Acid, Venous 2.01 (*)    All other components within normal limits  CBG MONITORING, ED - Abnormal; Notable for the following:    Glucose-Capillary 304 (*)    All other components within normal limits  I-STAT VENOUS BLOOD GAS, ED - Abnormal; Notable for the following:    pCO2, Ven 34.0 (*)    pO2, Ven 26.0 (*)    Bicarbonate 16.2 (*)    TCO2 17 (*)    Acid-base deficit 9.0 (*)    All other components within normal limits  URINALYSIS, ROUTINE W REFLEX MICROSCOPIC  BASIC METABOLIC PANEL  BASIC METABOLIC PANEL  BASIC METABOLIC PANEL  BASIC METABOLIC PANEL  HEMOGLOBIN A1C    EKG  EKG Interpretation  Date/Time:  Friday October 19 2016 12:11:35 EDT Ventricular Rate:  84 PR Interval:  164 QRS Duration: 74 QT Interval:  388 QTC Calculation: 458 R Axis:   23 Text Interpretation:  Normal sinus rhythm Normal ECG No significant change since last tracing Confirmed by Addison Lank [phone]) on 10/19/2016 1:41:25 PM       Radiology No results found.  Procedures Procedures (including critical care time)  Medications Ordered in ED Medications  bisoprolol (ZEBETA) tablet 5 mg (not administered)  ezetimibe (ZETIA) tablet 10 mg (not administered)  0.9 %  sodium chloride infusion (not administered)  insulin regular (NOVOLIN R,HUMULIN R) 100 Units in sodium chloride 0.9 % 100 mL (1 Units/mL) infusion (not administered)  enoxaparin (LOVENOX) injection 40 mg (not administered)  dextrose 5 %-0.45 % sodium chloride infusion (not administered)  ondansetron (ZOFRAN) injection 4 mg (not administered)  living well with diabetes book MISC (not administered)  sodium chloride 0.9 % bolus 1,000 mL (0 mLs Intravenous Stopped 10/19/16 1514)    And  sodium chloride 0.9 % bolus 1,000 mL (0 mLs Intravenous Stopped 10/19/16 1514)  ondansetron (ZOFRAN) injection 4 mg (4 mg Intravenous Given 10/19/16 1414)      Initial Impression / Assessment and Plan / ED Course  I have reviewed the triage vital signs and the nursing notes.  Pertinent labs & imaging results that were available during my care of the patient were reviewed by me and considered in my medical decision making (see chart for details).     Patient with apparent DKA. Afebrile, vital signs are at patient's baseline, no focal neurological deficits on examination. Found to have glucose of 323, bicarbonate 12, anion gap 21 suggestive of diabetic ketoacidosis. Denies ASA  use, excessive tylenol use, treatment for TB, or any other causative metabolic acidosis. ABG shows pH of 7.287. Initiated fluid resuscitation, insulin drip, and potassium repletion. Some improvement of glucose to 304. No fever, nuchal rigidity, or meningeal signs to suggest meningitis. Spoke with Dyanne Carrel NP with [facility] who agrees to assume care of patient with attending Dr. Florene Glen, and bring pt into hospital for further workup and management. Pt verbalized understanding of and agreement with plan. Patient seen and evaluated by Dr. Leonette Monarch, who agrees with assessment and plan.   Final Clinical Impressions(s) / ED Diagnoses   Final diagnoses:  Metabolic acidosis    New Prescriptions New Prescriptions   No medications on file     Renita Papa, PA-C 10/19/16 1541

## 2016-10-19 NOTE — Plan of Care (Signed)
Problem: Nutritional: Goal: Maintenance of adequate nutrition will improve Outcome: Progressing Will need more education. New Diabetic patient.

## 2016-10-19 NOTE — Progress Notes (Signed)
Patient arrived the unit on a stretcher, assessment completed see flowsheet, placed on tele ccdm notified,pt continues on insulin drip patient oriented to room and staff, bed in lowest position, call bell within reach, will continue to monitor.

## 2016-10-19 NOTE — Telephone Encounter (Signed)
Returned call to patient he stated he has been having pain in mid back for the past 2 days.No chest pain.Stated he is now dizzy and vomiting.Rates pain # 5 to 6.Stated he moved heavy furniture this past Tuesday.Advised to go to [facility] ED.Trish notified.

## 2016-10-20 ENCOUNTER — Inpatient Hospital Stay (HOSPITAL_COMMUNITY): Payer: PPO

## 2016-10-20 DIAGNOSIS — R112 Nausea with vomiting, unspecified: Secondary | ICD-10-CM

## 2016-10-20 DIAGNOSIS — E871 Hypo-osmolality and hyponatremia: Secondary | ICD-10-CM

## 2016-10-20 DIAGNOSIS — I2510 Atherosclerotic heart disease of native coronary artery without angina pectoris: Secondary | ICD-10-CM

## 2016-10-20 DIAGNOSIS — E1310 Other specified diabetes mellitus with ketoacidosis without coma: Secondary | ICD-10-CM

## 2016-10-20 DIAGNOSIS — N179 Acute kidney failure, unspecified: Secondary | ICD-10-CM

## 2016-10-20 DIAGNOSIS — I10 Essential (primary) hypertension: Secondary | ICD-10-CM

## 2016-10-20 LAB — BASIC METABOLIC PANEL
Anion gap: 10 (ref 5–15)
Anion gap: 12 (ref 5–15)
BUN: 15 mg/dL (ref 6–20)
BUN: 17 mg/dL (ref 6–20)
CO2: 20 mmol/L — ABNORMAL LOW (ref 22–32)
CO2: 19 mmol/L — ABNORMAL LOW (ref 22–32)
Calcium: 8.5 mg/dL — ABNORMAL LOW (ref 8.9–10.3)
Calcium: 8.9 mg/dL (ref 8.9–10.3)
Chloride: 106 mmol/L (ref 101–111)
Chloride: 104 mmol/L (ref 101–111)
Creatinine, Ser: 1.26 mg/dL — ABNORMAL HIGH (ref 0.61–1.24)
Creatinine, Ser: 1.25 mg/dL — ABNORMAL HIGH (ref 0.61–1.24)
GFR calc Af Amer: >60 mL/min (ref >60)
GFR calc Af Amer: >60 mL/min (ref >60)
GFR calc non Af Amer: 57 mL/min — ABNORMAL LOW (ref >60)
GFR calc non Af Amer: 58 mL/min — ABNORMAL LOW (ref >60)
Glucose, Bld: 188 mg/dL — ABNORMAL HIGH (ref 65–99)
Glucose, Bld: 195 mg/dL — ABNORMAL HIGH (ref 65–99)
Potassium: 4.5 mmol/L (ref 3.5–5.1)
Potassium: 5 mmol/L (ref 3.5–5.1)
Sodium: 136 mmol/L (ref 135–145)
Sodium: 135 mmol/L (ref 135–145)

## 2016-10-20 LAB — GLUCOSE, CAPILLARY
Glucose-Capillary: 120 mg/dL — ABNORMAL HIGH (ref 65–99)
Glucose-Capillary: 162 mg/dL — ABNORMAL HIGH (ref 65–99)
Glucose-Capillary: 182 mg/dL — ABNORMAL HIGH (ref 65–99)
Glucose-Capillary: 183 mg/dL — ABNORMAL HIGH (ref 65–99)
Glucose-Capillary: 194 mg/dL — ABNORMAL HIGH (ref 65–99)
Glucose-Capillary: 211 mg/dL — ABNORMAL HIGH (ref 65–99)

## 2016-10-20 LAB — LIPID PANEL
Cholesterol: 173 mg/dL (ref 0–200)
HDL: 29 mg/dL — ABNORMAL LOW (ref >40)
LDL Cholesterol: 104 mg/dL — ABNORMAL HIGH (ref 0–99)
Total CHOL/HDL Ratio: 6 RATIO
Triglycerides: 201 mg/dL — ABNORMAL HIGH (ref <150)
VLDL: 40 mg/dL (ref 0–40)

## 2016-10-20 LAB — TSH: TSH: 0.525 u[IU]/mL (ref 0.350–4.500)

## 2016-10-20 MED ORDER — INSULIN GLARGINE 100 UNIT/ML ~~LOC~~ SOLN
15.0000 [IU] | Freq: Every day | SUBCUTANEOUS | Status: DC
  Administered 2016-10-20: 15 [IU] via SUBCUTANEOUS
  Filled 2016-10-20 (×2): qty 0.15

## 2016-10-20 MED ORDER — GADOBENATE DIMEGLUMINE 529 MG/ML IV SOLN
20.0000 mL | Freq: Once | INTRAVENOUS | Status: AC
  Administered 2016-10-20: 20 mL via INTRAVENOUS

## 2016-10-20 MED ORDER — INSULIN ASPART 100 UNIT/ML ~~LOC~~ SOLN: 6.0000 [IU] | Freq: Three times a day (TID) | SUBCUTANEOUS | Status: DC

## 2016-10-20 MED ORDER — INSULIN ASPART 100 UNIT/ML ~~LOC~~ SOLN
0.0000 [IU] | Freq: Three times a day (TID) | SUBCUTANEOUS | Status: DC
  Administered 2016-10-20: 8 [IU] via SUBCUTANEOUS
  Administered 2016-10-20 (×2): 4 [IU] via SUBCUTANEOUS
  Administered 2016-10-21: 2 [IU] via SUBCUTANEOUS
  Administered 2016-10-21: 4 [IU] via SUBCUTANEOUS

## 2016-10-20 MED ORDER — INSULIN GLARGINE 100 UNIT/ML ~~LOC~~ SOLN: 24.0000 [IU] | Freq: Every day | SUBCUTANEOUS | Status: DC

## 2016-10-20 MED ORDER — INSULIN ASPART 100 UNIT/ML ~~LOC~~ SOLN: 8.0000 [IU] | Freq: Three times a day (TID) | SUBCUTANEOUS | Status: DC

## 2016-10-20 MED ORDER — INSULIN GLARGINE 100 UNIT/ML ~~LOC~~ SOLN
20.0000 [IU] | Freq: Every day | SUBCUTANEOUS | Status: DC
  Filled 2016-10-20: qty 0.2

## 2016-10-20 MED ORDER — SODIUM CHLORIDE 0.9 % IV SOLN
INTRAVENOUS | Status: DC
  Administered 2016-10-20 – 2016-10-21 (×3): via INTRAVENOUS

## 2016-10-20 MED ORDER — INSULIN STARTER KIT- PEN NEEDLES (ENGLISH)
1.0000 | Freq: Once | Status: AC
  Administered 2016-10-20: 1
  Filled 2016-10-20: qty 1

## 2016-10-20 NOTE — Progress Notes (Signed)
Inpatient Diabetes Program Recommendations  AACE/ADA: New Consensus Statement on Inpatient Glycemic Control (2015)  Target Ranges:  Prepandial:   less than 140 mg/dL      Peak postprandial:   less than 180 mg/dL (1-2 hours)      Critically ill patients:  140 - 180 mg/dL   Lab Results  Component Value Date   GLUCAP 182 (H) 10/20/2016   HGBA1C 13.6 (H) 10/19/2016    Review of Glycemic Control  Diabetes history: No prior hx DM  Inpatient Diabetes Program Recommendations:  Noted patient is new onset type 2. Resources ordered include Living well with Diabetes, starter kit with pen needles, dietician consult, and patient education videos.  Nurses, please start teaching insulin pen using pen station on unit and allow patient to start giving his own injections as he is able. Will follow during hospitalization.  Thank you, Nani Gasser. Raymone Pembroke, RN, MSN, CDE  Diabetes Coordinator Inpatient Glycemic Control Team Team Pager [phone] (8am-5pm) 10/20/2016 11:04 AM

## 2016-10-20 NOTE — Progress Notes (Signed)
PROGRESS NOTE  Shawn Hernandez  ZJI:[id number]  DOB: July 13, 1950  DOA: 10/19/2016 PCP: [name], MD   Brief Admission Hx: 66 year old gentleman with a history of hypertension, diverticulosis, coronary disease, reactive arthritis presenting with 4 days of N/V, polydipsia and polyuria.   He was found to have an anion gap metabolic acidosis with hyperglycemia consistent with DKA.  VBG notable for low CO2, but normal pH. Lactic acid mildly elevated at 2.01. Mild leukocytosis. UA without any signs of infection.  He was bolused and started on DKA protocol.  MDM/Assessment & Plan:   DKA - Pt was transitioned off the IV insulin to lantus 10 units, will check a CBG now, AG corrected, continue normal saline IV.  MRI abdomen with normal appearing pancreas.    Diabetes mellitus, type 2, newly diagnosed with A1c>13.  Started basal bolus supplemental insulin program, lantus 20 units plus novolog 6 units TIDAC plus supplemental sliding scale.  Begin diabetes education and insulin self administration.  He likely could go home on lantus plus glucophage XR with close follow up with PCP and endocrinologist referral.  Awaiting antibody testing.    CBG (last 3)   Recent Labs  10/20/16 0017 10/20/16 0357 10/20/16 0853  GLUCAP 120* 194* 182*   AKI - suspect he has underlying CKD.  Monitor closely, avoid nephrotoxins.    Hypertension -  Blood pressures stable and controlled. Follow.   CAD - stable, resumed home meds, outpatient cardiology follow up.    Hepatic steatosis - follow outpatient.  May need GI referral.    Dizziness - Pt still appears dehydrated, continue IVF hydration, ask for PT evaluation.   Hyponatremia - improved with hydration.    DVT prophylaxis: lovenox Code Status: FULL  Family Communication: none at bedside Disposition Plan: Home in 1-2 days  Subjective: Pt says he feels tired and dizzy.    Objective: Vitals:   10/19/16 2000 10/20/16 0000 10/20/16 0400 10/20/16  0500  BP: 125/64 123/64 131/77   Pulse: 71 64 68 62  Resp: 14 16 20 17   Temp: 98.9 F (37.2 C) 98.9 F (37.2 C) 98.9 F (37.2 C)   TempSrc: Oral Oral Oral   SpO2: 95% 95% 95%   Weight:   95.9 kg (211 lb 6.4 oz)   Height:        Intake/Output Summary (Last 24 hours) at 10/20/16 0723 Last data filed at 10/20/16 0400  Gross per 24 hour  Intake          2363.29 ml  Output              800 ml  Net          1563.29 ml   Filed Weights   10/19/16 1724 10/20/16 0400  Weight: 95.3 kg (210 lb 1.6 oz) 95.9 kg (211 lb 6.4 oz)   REVIEW OF SYSTEMS  As per history otherwise all reviewed and reported negative  Exam:  General exam: pale, tired appearing, dry mucus membranes.  Respiratory system: Clear. No increased work of breathing. Cardiovascular system: S1 & S2 heard, RRR. No JVD, murmurs, gallops, clicks or pedal edema. Gastrointestinal system: Abdomen is nondistended, soft and nontender. Normal bowel sounds heard. Central nervous system: Alert and oriented. No focal neurological deficits. Extremities: no CCE.  Data Reviewed: Basic Metabolic Panel:  Recent Labs Lab 10/19/16 1246 10/19/16 1817 10/19/16 2228 10/20/16 0226  NA 132* 135 136 136  K 4.5 4.3 3.8 4.5  CL 99* 104 105 106  CO2 12* 19*  22 20*  GLUCOSE 323* 219* 135* 188*  BUN 16 13 14 15   CREATININE 1.39* 1.20 1.18 1.26*  CALCIUM 9.1 8.4* 8.5* 8.5*   Liver Function Tests:  Recent Labs Lab 10/19/16 1817  AST 20  ALT 19  ALKPHOS 76  BILITOT 1.4*  PROT 6.0*  ALBUMIN 3.5   No results for input(s): LIPASE, AMYLASE in the last 168 hours. No results for input(s): AMMONIA in the last 168 hours. CBC:  Recent Labs Lab 10/19/16 1246  WBC 11.6*  HGB 15.7  HCT 46.2  MCV 92.6  PLT 265   Cardiac Enzymes: No results for input(s): CKTOTAL, CKMB, CKMBINDEX, TROPONINI in the last 168 hours. CBG (last 3)   Recent Labs  10/19/16 2307 10/20/16 0017 10/20/16 0357  GLUCAP 112* 120* 194*   No results found  for this or any previous visit (from the past 240 hour(s)).   Studies: No results found.  Scheduled Meds: . bisoprolol  5 mg Oral Daily  . enoxaparin (LOVENOX) injection  40 mg Subcutaneous Q24H  . ezetimibe  10 mg Oral Daily  . insulin aspart  0-24 Units Subcutaneous TID WC  . insulin aspart  8 Units Subcutaneous TID WC  . insulin glargine  24 Units Subcutaneous QHS  . living well with diabetes book   Does not apply Once   Continuous Infusions: . sodium chloride Stopped (10/20/16 0403)   Principal Problem:   DKA (diabetic ketoacidoses) ([place]) Active Problems:   Coronary artery disease   Hypertension   Nausea and vomiting   Acute kidney injury ([place])   Hyponatremia  Time spent:   Irwin Brakeman, MD, FAAFP [facility] Pager [phone]  If 7PM-7AM, please contact night-coverage [email or web address] Password [email or web address] 10/20/2016, 7:23 AM    LOS: 1 day

## 2016-10-20 NOTE — Plan of Care (Signed)
Problem: Food- and Nutrition-Related Knowledge Deficit (NB-1.1) Goal: Nutrition education Formal process to instruct or train a patient/client in a skill or to impart knowledge to help patients/clients voluntarily manage or modify food choices and eating behavior to maintain or improve health. Outcome: Progressing  RD consulted for nutrition education regarding new diagnosis of diabetes.   Lab Results  Component Value Date   HGBA1C 13.6 (H) 10/19/2016   It is apparent patient is not feeling well, he has emesis bag next to him, reports no appetite and has fatigue. Despite this, he is still agreeable to discussing a diabetic diet.   Dietary recall:  Breakfast: Only eats breakfast 2-3x a week: Couple eggs, toast, Coffee. Other days he skips breakfast Lunch: Sandwhich typically, deli meats w/ lettuce +tomato + mayo is common Dinner: Typically, family picks up prepared meals from the store (such as the baked chicken), hamburger helper, pasta Beverages: Sweet tea 4 glasses per day, regular soda.  Other: Does not always have a vegetable with his meals. Does not read any labels  First and foremost, RD explained that sugary beverages are by far the biggest issue with his current diet. Discussed how sugar sweetened beverages are the quickest way to increase his BG and are the leading cause of diabetes. It is very much in his best interest to rid his diet of these. RD acknowledges this may be difficult and recommended weaning himself off; first, he can try drinking 55/50 sweet/unsweet tea as a stepping stone to 100% unsweet. He can also try the diet soda instead of the regulars. He said he could try a stevia sweetened tea.  Another aspect of his diet that is lacking is his vegetable intake. He sounds to largely exclude this from his meals. RD reviewed the "Plate Method", drawing a diagram on his educational materials.  Explained how his meals should always contain protein/vegetable. He should aim for no  more of 25% being starch/carb and for 25-50% being protein and 25-50% being vegetable. Reviewed starchy vegetables and how these fall into the carb section, not the vegetable section. He had been under impression that peas were a "true vegetable". Furthermore, advocated that the patient consume his protein and vegetable prior to consuming his carbohydrates.   As an alternative to visually looking at his plate, he can read labels. He currently does not look at labels at all. RD reviewed the pertinent aspects of a nutritional label. He should look for protein/fiber to be high and sugar/total carbs to be low. He needs to take into account serving size. Gave estimate of 70-80 carbs/meal or 250 a day. Acknowledges recommendation  He says that his pasta/grains are sometimes whole grains, but says that sometimes they get white noodles. RD went over the benefit of whole grains as opposed to white noodles/rice.Clarified that switching to whole grains does not mean that he is fine with making >25% of his meal pasta.   RD provided "Type 2 Diabetes Nutritional Therapy" handout from the Academy of [employer] Dietetics.   Summary of Recommendations: 1. Eradicate sugary sweetened beverages from diet. Switch to sugar free beverages 2. Eat vegetable and/or protein with EVERY meal. Follow the plate method 3. Read labels. Look for items high in Pro/Fiber and low in total carbs/added sugar. Aim for 70-80g carb/ meal 4. Switch to whole grain pasta/rice from whole grains.   Expect fair compliance. Education session was limited by patient currently feeling very sick and not being to able to converse. He also verbalized  "I  still dont believe I have diabetes...my lab work has always been good".   Body mass index is 28.67 kg/m. Pt meets criteria for overweight based on current BMI.  Current diet order is heart healthy/carb mod, patient is consuming approximately 100% of meals at this time. Labs and medications  reviewed. No further nutrition interventions warranted at this time.  If additional nutrition issues arise, please re-consult RD.  Burtis Junes RD, LDN, CNSC Clinical Nutrition Pager: [phone] 10/20/2016 3:54 PM

## 2016-10-21 ENCOUNTER — Inpatient Hospital Stay (HOSPITAL_COMMUNITY): Payer: PPO

## 2016-10-21 DIAGNOSIS — E119 Type 2 diabetes mellitus without complications: Secondary | ICD-10-CM | POA: Diagnosis present

## 2016-10-21 LAB — BASIC METABOLIC PANEL
Anion gap: 7 (ref 5–15)
BUN: 16 mg/dL (ref 6–20)
CO2: 24 mmol/L (ref 22–32)
Calcium: 8.6 mg/dL — ABNORMAL LOW (ref 8.9–10.3)
Chloride: 105 mmol/L (ref 101–111)
Creatinine, Ser: 1 mg/dL (ref 0.61–1.24)
GFR calc Af Amer: >60 mL/min (ref >60)
GFR calc non Af Amer: >60 mL/min (ref >60)
Glucose, Bld: 193 mg/dL — ABNORMAL HIGH (ref 65–99)
Potassium: 4.2 mmol/L (ref 3.5–5.1)
Sodium: 136 mmol/L (ref 135–145)

## 2016-10-21 LAB — GLUCOSE, CAPILLARY
Glucose-Capillary: 199 mg/dL — ABNORMAL HIGH (ref 65–99)
Glucose-Capillary: 178 mg/dL — ABNORMAL HIGH (ref 65–99)
Glucose-Capillary: 143 mg/dL — ABNORMAL HIGH (ref 65–99)

## 2016-10-21 LAB — C-PEPTIDE: C-Peptide: 2.2 ng/mL (ref 1.1–4.4)

## 2016-10-21 MED ORDER — ACETAMINOPHEN 500 MG PO TABS
1000.0000 mg | ORAL_TABLET | Freq: Once | ORAL | Status: AC
  Administered 2016-10-21: 1000 mg via ORAL
  Filled 2016-10-21: qty 2

## 2016-10-21 MED ORDER — BLOOD GLUCOSE METER KIT: PACK | 0 refills | Status: AC

## 2016-10-21 MED ORDER — TRAMADOL HCL 50 MG PO TABS
50.0000 mg | ORAL_TABLET | Freq: Once | ORAL | Status: AC
  Administered 2016-10-21: 50 mg via ORAL
  Filled 2016-10-21: qty 1

## 2016-10-21 MED ORDER — METFORMIN HCL ER 500 MG PO TB24: 500.0000 mg | ORAL_TABLET | Freq: Two times a day (BID) | ORAL | 0 refills | Status: DC

## 2016-10-21 MED ORDER — INSULIN PEN NEEDLE 31G X 5 MM MISC: 1.0000 | 0 refills | Status: DC

## 2016-10-21 MED ORDER — INSULIN GLARGINE 100 UNIT/ML SOLOSTAR PEN: 10.0000 [IU] | PEN_INJECTOR | Freq: Every day | SUBCUTANEOUS | 0 refills | Status: DC

## 2016-10-21 MED ORDER — INSULIN ASPART 100 UNIT/ML ~~LOC~~ SOLN: 4.0000 [IU] | Freq: Three times a day (TID) | SUBCUTANEOUS | Status: DC

## 2016-10-21 NOTE — Evaluation (Signed)
Physical Therapy Evaluation Patient Details Name: Shawn Hernandez MRN: [id number] DOB: [birth year] Today's Date: 10/21/2016   History of Present Illness  66 year old gentleman with a history of hypertension, diverticulosis, coronary disease, reactive arthritis presenting with 4 days of N/V, polydipsia and polyuria.   He was found to have an anion gap metabolic acidosis with hyperglycemia consistent with DKA.  Work up includes new dx of DM2, and acute kidney injury due to dehydration.  Clinical Impression  Pt admitted with/for DKA and new dx of DM.  Pt continues to by dizzy and anorexic.  He is at a min guard level of mobility due to dizziness and decrease balance.  Pt currently limited functionally due to the problems listed below.  (see problems list.)  Pt will benefit from PT to maximize function and safety to be able to get home safely with available assist of son.     Follow Up Recommendations Home health PT;Other (comment) (pt has declined PT, community diabetic follow up)    Equipment Recommendations  None recommended by PT    Recommendations for Other Services       Precautions / Restrictions Precautions Precautions: Fall      Mobility  Bed Mobility Overal bed mobility: Modified Independent                Transfers Overall transfer level: Modified independent               General transfer comment: mildly unsteady upon initially standing as if dizzy/lightheaded.  Ambulation/Gait Ambulation/Gait assistance: Min guard Ambulation Distance (Feet): 170 Feet Assistive device: None Gait Pattern/deviations: Step-through pattern Gait velocity: slower Gait velocity interpretation: Below normal speed for age/gender General Gait Details: mildly unsteady throughout with some wandering, scissoring or mild staggering to maintain balance.  pt reacts to being off balance by an appropriate strategy, but can't stay steady/  Stairs            Wheelchair Mobility     Modified Rankin (Stroke Patients Only)       Balance Overall balance assessment: Needs assistance   Sitting balance-Leahy Scale: Good       Standing balance-Leahy Scale: Fair Standing balance comment: static fair, dynamically off balance                             Pertinent Vitals/Pain Pain Assessment: No/denies pain    Home Living Family/patient expects to be discharged to:: Private residence Living Arrangements: Children Available Help at Discharge: Family (pt not alone very often, but he says his son won't be helpfu) Type of Home: House Home Access: Stairs to enter     Home Layout: One level Home Equipment: None Additional Comments: pt in the middle of moving [place], [place].  pt states presently living on air mattresses.    Prior Function Level of Independence: Independent               Hand Dominance        Extremity/Trunk Assessment   Upper Extremity Assessment Upper Extremity Assessment: Overall WFL for tasks assessed    Lower Extremity Assessment Lower Extremity Assessment: Overall WFL for tasks assessed       Communication   Communication: No difficulties  Cognition Arousal/Alertness: Lethargic Behavior During Therapy: Flat affect Overall Cognitive Status: Within Functional Limits for tasks assessed  General Comments: pt generally listless and apathetic      General Comments General comments (skin integrity, edema, etc.): pt does not appear to have a good strategy to cope with this new dx, while feeling so bad, in the middle of a move and with minimal assist.  Pt needs community assist with diabetic management in my opinion, based on statements made in our session.  RN made aware.    Exercises     Assessment/Plan    PT Assessment Patient needs continued PT services  PT Problem List Decreased activity tolerance;Decreased mobility;Decreased balance        PT Treatment Interventions Gait training;Stair training;Functional mobility training;Therapeutic activities;Balance training;Patient/family education    PT Goals (Current goals can be found in the Care Plan section)  Acute Rehab PT Goals Patient Stated Goal: feel better PT Goal Formulation: With patient Time For Goal Achievement: 10/28/16 Potential to Achieve Goals: Good    Frequency Min 3X/week   Barriers to discharge        Co-evaluation               AM-PAC PT "6 Clicks" Daily Activity  Outcome Measure Difficulty turning over in bed (including adjusting bedclothes, sheets and blankets)?: None Difficulty moving from lying on back to sitting on the side of the bed? : None Difficulty sitting down on and standing up from a chair with arms (e.g., wheelchair, bedside commode, etc,.)?: A Little Help needed moving to and from a bed to chair (including a wheelchair)?: A Little Help needed walking in hospital room?: A Little Help needed climbing 3-5 steps with a railing? : A Little 6 Click Score: 20    End of Session   Activity Tolerance: Patient tolerated treatment well Patient left: in bed;with call bell/phone within reach Nurse Communication: Mobility status PT Visit Diagnosis: Unsteadiness on feet (R26.81);Dizziness and giddiness (R42)    Time: 7616-0737 PT Time Calculation (min) (ACUTE ONLY): 34 min   Charges:   PT Evaluation $PT Eval Moderate Complexity: 1 Mod PT Treatments $Gait Training: 8-22 mins   PT G Codes:        November 11, 2016  Donnella Sham, PT [phone]  (pager)  Tessie Fass Smitty Ackerley 11-11-2016, 4:27 PM

## 2016-10-21 NOTE — Discharge Instructions (Signed)
Follow with Primary MD  [name], MD  and other consultant's as instructed your Hospitalist MD  Please get a complete blood count and chemistry panel checked by your Primary MD at your next visit, and again as instructed by your Primary MD.  Get Medicines reviewed and adjusted: Please take all your medications with you for your next visit with your Primary MD  Laboratory/radiological data: Please request your Primary MD to go over all hospital tests and procedure/radiological results at the follow up, please ask your Primary MD to get all Hospital records sent to his/her office.  In some cases, they will be blood work, cultures and biopsy results pending at the time of your discharge. Please request that your primary care M.D. follows up on these results.  Also Note the following: If you experience worsening of your admission symptoms, develop shortness of breath, life threatening emergency, suicidal or homicidal thoughts you must seek medical attention immediately by calling 911 or calling your MD immediately  if symptoms less severe.  You must read complete instructions/literature along with all the possible adverse reactions/side effects for all the Medicines you take and that have been prescribed to you. Take any new Medicines after you have completely understood and accpet all the possible adverse reactions/side effects.   Do not drive when taking Pain medications or sleeping medications (Benzodaizepines)  Do not take more than prescribed Pain, Sleep and Anxiety Medications. It is not advisable to combine anxiety,sleep and pain medications without talking with your primary care practitioner  Special Instructions: If you have smoked or chewed Tobacco  in the last 2 yrs please stop smoking, stop any regular Alcohol  and or any Recreational drug use.  Wear Seat belts while driving.  Please note: You were cared for by a hospitalist during your hospital stay. Once you are  discharged, your primary care physician will handle any further medical issues. Please note that NO REFILLS for any discharge medications will be authorized once you are discharged, as it is imperative that you return to your primary care physician (or establish a relationship with a primary care physician if you do not have one) for your post hospital discharge needs so that they can reassess your need for medications and monitor your lab values.

## 2016-10-21 NOTE — Discharge Summary (Addendum)
Physician Discharge Summary  Shawn Hernandez RKY:[id number] DOB: Oct 13, 1950 DOA: 10/19/2016  PCP: [name], MD Cardiologist: Dr. Martinique  Admit date: 10/19/2016 Discharge date: 10/21/2016  Admitted From: Home Disposition: Home   Recommendations for Outpatient Follow-up:  1. Follow up with PCP in 1 weeks 2. Follow up with cardiology in 1-2 weeks 3. Zybeta discontinued due to bradycardia, follow up with cardiologist to discuss other treatments 4. Please obtain BMP/CBC in one week 5. Please adjust diabetes medications as needed for optimal glycemic control 6. Please repeat hemoglobin A1c in 3-4 months. 7. Please repeat lipid panel in 3-4 months. 8. Please refer to qualified endocrinologist if appropriate. 9. Please consider GI referral for hepatic steatosis. 10. Please follow up on the following pending results: C-peptide, islet cell antibodies, GAD 65 antibodies  Discharge Condition: Stable   CODE STATUS: Full  Brief Hospitalization Summary: Please see all hospital notes, images, labs for full details of the hospitalization.  HPI: Shawn Hernandez is a 66 y.o. male with medical history significant for hypertension, diverticulosis, CAD, paroxysmal A. fib flutter, arthritis, hypercholesterolemia, presents to the emergency department with chief complaint 4 day history headache dizziness nausea and vomiting, polydipsia with frequent urination. Initial evaluation reveals serum glucose greater than 300 carb 12 acute kidney injury. Patient never diagnosed with diabetes.  Information is obtained from the patient. He reports several days ago he was helping a friend move shortly thereafter developed gradual mid back pain. Described as an ache worse with movement. Shortly thereafter he noticed dizziness worse with movement lightheadedness headache. He also reported "dry mouth and frequent urination". He states he got up during the night for 5 times to urinate. Yesterday he developed nausea  with one episode of vomiting. He denies any coffee ground emesis. He denies diarrhea. He denies dysuria hematuria. He denies constipation melena or bright red blood per rectum.  ED Course: In the emergency department he is afebrile with a blood pressure at the high end of normal. Hemodynamically stable. Not hypoxic. He is provided with 2 L of normal saline. Insulin drip is initiated.  Brief Admission Hx: 66 year old gentleman with a history of hypertension, diverticulosis, coronary disease, reactive arthritis presenting with 4 days of N/V, polydipsia and polyuria. He was found to have an anion gap metabolic acidosis with hyperglycemia consistent with DKA. VBG notable for low CO2, but normal pH. Lactic acid mildly elevated at 2.01. Mild leukocytosis. UA without any signs of infection. He was bolused and started on DKA protocol.  MDM/Assessment & Plan:   DKA - Pt was transitioned off the IV insulin to lantus 10 units, will check a CBG now, AG corrected, continue normal saline IV.  MRI abdomen with normal appearing pancreas.    Diabetes mellitus, type 2, newly diagnosed with A1c>13.  Started basal bolus supplemental insulin program, lantus 20 units plus novolog 6 units TIDAC plus supplemental sliding scale.  Begin diabetes education and insulin self administration.  He likely could go home on lantus plus glucophage XR with close follow up with PCP and endocrinologist referral.  Awaiting antibody testing.   will need to follow-up those results with PCP and/or endocrinologist. He is going to be discharged on Lantus 10 units daily plus Glucophage XRT 500 mg 1 by mouth twice a day with meals. Further titration outpatient with primary care and endocrinologist. A referral has been made for outpatient diabetes education. Patient was seen by the dietitian in the hospital. The patient was also seen by the inpatient diabetes coordinator. Please see  the notes.  CBG (last 3)   Recent Labs (last 2 labs)     Recent Labs  10/20/16 0017 10/20/16 0357 10/20/16 0853  GLUCAP 120* 194* 182*     AKI - Creatinine improved to normal with hydration. When diabetes is better controlled recommend checking a microalbumin.    Hypertension -  DC zybeta due to bradycardia.  Follow up with PCP for recheck.    CAD - stable, resumed home meds, outpatient cardiology follow up.    Hepatic steatosis - follow outpatient.  May need GI referral.    Dizziness - Pt was treated with IVF hydration with improvement in symptoms.   Hyponatremia - improved with hydration.    DVT prophylaxis: lovenox Code Status: FULL  Family Communication: none at bedside Disposition Plan: Home  Discharge Diagnoses:  Principal Problem:   DKA (diabetic ketoacidoses) ([place]) Active Problems:   DM (diabetes mellitus) type 2, uncontrolled, with ketoacidosis ([place])   Coronary artery disease   Hypertension   Nausea and vomiting   Acute kidney injury ([place])   Hyponatremia  Discharge Instructions: Discharge Instructions    Ambulatory referral to Nutrition and Diabetic Education    Complete by:  As directed    New onset DM2   Call MD for:  difficulty breathing, headache or visual disturbances    Complete by:  As directed    Call MD for:  extreme fatigue    Complete by:  As directed    Call MD for:  persistant dizziness or light-headedness    Complete by:  As directed    Call MD for:  persistant nausea and vomiting    Complete by:  As directed    Increase activity slowly    Complete by:  As directed      Allergies as of 10/21/2016      Reactions   Ivp Dye [iodinated Diagnostic Agents] Hives   Lisinopril Other (See Comments)   cough   Statins Other (See Comments)   myalgias      Medication List    STOP taking these medications   bisoprolol 5 MG tablet Commonly known as:  ZEBETA     TAKE these medications   acetaminophen 325 MG tablet Commonly known as:  TYLENOL Take 650 mg by mouth every 6 (six) hours  as needed for mild pain.   blood glucose meter kit and supplies Dispense based on patient and insurance preference. Use up to four times daily as directed. (FOR ICD-10 E11.10).   ezetimibe 10 MG tablet Commonly known as:  ZETIA TAKE 1 TABLET (10 MG TOTAL) BY MOUTH DAILY.   fenofibrate 145 MG tablet Commonly known as:  TRICOR TAKE 1 TABLET BY MOUTH ONCE DAILY   Insulin Glargine 100 UNIT/ML Solostar Pen Commonly known as:  LANTUS Inject 10 Units into the skin daily at 10 pm.   Insulin Pen Needle 31G X 5 MM Misc 1 Device by Does not apply route as directed.   metFORMIN 500 MG 24 hr tablet Commonly known as:  GLUCOPHAGE XR Take 1 tablet (500 mg total) by mouth 2 (two) times daily with a meal.   Vitamin D 2000 units Caps Take 1 capsule by mouth daily.            Discharge Care Instructions        Start     Ordered   10/21/16 0000  Insulin Glargine (LANTUS) 100 UNIT/ML Solostar Pen  Daily at 10 pm     10/21/16 1034  10/21/16 0000  Insulin Pen Needle 31G X 5 MM MISC  As directed     10/21/16 1034   10/21/16 0000  blood glucose meter kit and supplies    Question Answer Comment  Number of strips 100   Number of lancets 100      10/21/16 1034   10/21/16 0000  metFORMIN (GLUCOPHAGE XR) 500 MG 24 hr tablet  2 times daily with meals     10/21/16 1034   10/21/16 0000  Increase activity slowly     10/21/16 1034   10/21/16 0000  Call MD for:  persistant nausea and vomiting     10/21/16 1034   10/21/16 0000  Call MD for:  difficulty breathing, headache or visual disturbances     10/21/16 1034   10/21/16 0000  Call MD for:  persistant dizziness or light-headedness     10/21/16 1034   10/21/16 0000  Call MD for:  extreme fatigue     10/21/16 1034   10/20/16 0000  Ambulatory referral to Nutrition and Diabetic Education    Comments:  New onset DM2   10/20/16 1104     Follow-up Information    [name], MD. Schedule an appointment as soon as possible for a visit  in 1 week(s).   Specialty:  Internal Medicine Why:  Hospital Follow Up  Contact information: [address] [place] [address] [phone]        Martinique, Peter M, MD. Schedule an appointment as soon as possible for a visit in 2 week(s).   Specialty:  Cardiology Why:  Hospital Follow Up bradycardia Contact information: [address] [place] [address] [phone]          Allergies  Allergen Reactions  . Ivp Dye [Iodinated Diagnostic Agents] Hives  . Lisinopril Other (See Comments)    cough  . Statins Other (See Comments)    myalgias   Current Discharge Medication List    START taking these medications   Details  blood glucose meter kit and supplies Dispense based on patient and insurance preference. Use up to four times daily as directed. (FOR ICD-10 E11.10). Qty: 1 each, Refills: 0    Insulin Glargine (LANTUS) 100 UNIT/ML Solostar Pen Inject 10 Units into the skin daily at 10 pm. Qty: 15 mL, Refills: 0    Insulin Pen Needle 31G X 5 MM MISC 1 Device by Does not apply route as directed. Qty: 30 each, Refills: 0    metFORMIN (GLUCOPHAGE XR) 500 MG 24 hr tablet Take 1 tablet (500 mg total) by mouth 2 (two) times daily with a meal. Qty: 60 tablet, Refills: 0      CONTINUE these medications which have NOT CHANGED   Details  acetaminophen (TYLENOL) 325 MG tablet Take 650 mg by mouth every 6 (six) hours as needed for mild pain.    Cholecalciferol (VITAMIN D) 2000 units CAPS Take 1 capsule by mouth daily.    ezetimibe (ZETIA) 10 MG tablet TAKE 1 TABLET (10 MG TOTAL) BY MOUTH DAILY. Qty: 90 tablet, Refills: 2    fenofibrate (TRICOR) 145 MG tablet TAKE 1 TABLET BY MOUTH ONCE DAILY Qty: 90 tablet, Refills: 2      STOP taking these medications     bisoprolol (ZEBETA) 5 MG tablet         Procedures/Studies: Mr Abdomen W Contrast  Result Date: 10/20/2016 CLINICAL DATA:  New onset diabetes, assessment for pancreatic lesion.  EXAM: MRI ABDOMEN WITH CONTRAST  TECHNIQUE: Multiplanar multisequence MR imaging of the abdomen was performed after the administration of intravenous contrast. COMPARISON:  None. FINDINGS: Despite efforts by the technologist and patient, motion artifact is present on today's exam and could not be eliminated. This reduces exam sensitivity and specificity. Lower chest: Unremarkable Hepatobiliary: There is wall thickening of the gallbladder as shown on image 22/4. No biliary dilatation. Normal extrahepatic biliary contour. Geographic hepatic steatosis as shown on the out of phase images. No focal liver lesion identified. Pancreas: No focal pancreatic lesion is observed. Overall the pancreas appears unremarkable. Spleen:  Unremarkable Adrenals/Urinary Tract: Proximally 3 mm simple appearing cyst of the right mid kidney posteriorly. Adrenal glands normal. Mild chronic bilateral perirenal stranding. Otherwise unremarkable. Stomach/Bowel: Unremarkable Vascular/Lymphatic:  Unremarkable Other:  No supplemental non-categorized findings. Musculoskeletal: Mild lower lumbar spondylosis. IMPRESSION: 1. Normal MRI appearance of the pancreas. 2. Thickening of the gallbladder wall. This could be due to the patient's known hypoproteinemia, although inflammation can also cause gallbladder wall thickening. No biliary dilatation observed. 3. Geographic hepatic steatosis. Electronically Signed   By: Van Clines M.D.   On: 10/20/2016 09:17   Dg Chest Port 1 View  Result Date: 10/21/2016 CLINICAL DATA:  DKA EXAM: PORTABLE CHEST 1 VIEW COMPARISON:  07/19/2015 FINDINGS: Mild right hemidiaphragm elevation. Midline trachea. Cardiomegaly accentuated by AP portable technique. Atherosclerosis in the transverse aorta. No pleural effusion or pneumothorax. Low lung volumes with scattered areas of mid lung subsegmental atelectasis bilaterally. No lobar consolidation. IMPRESSION: Mild cardiomegaly and low lung volumes.  No acute findings.  Aortic Atherosclerosis (ICD10-I70.0). Electronically Signed   By: Abigail Miyamoto M.D.   On: 10/21/2016 10:20     Subjective: Pt says he is feeling better and he feels comfortable giving insulin injections using the insulin pen  Discharge Exam: Vitals:   10/21/16 0800 10/21/16 1200  BP: 137/69 133/71  Pulse: (!) 57 (!) 52  Resp: 17 15  Temp: 98.1 F (36.7 C) 98.2 F (36.8 C)  SpO2: 97% 97%   Vitals:   10/21/16 0000 10/21/16 0400 10/21/16 0800 10/21/16 1200  BP: 130/73 127/66 137/69 133/71  Pulse: (!) 50 (!) 49 (!) 57 (!) 52  Resp: '17 11 17 15  ' Temp: 98.2 F (36.8 C) 97.8 F (36.6 C) 98.1 F (36.7 C) 98.2 F (36.8 C)  TempSrc: Oral Oral Oral Oral  SpO2: 94% 95% 97% 97%  Weight:      Height:       General: Pt is alert, awake, not in acute distress Neck: supple, thyroid soft, no nodules palpated.  Cardiovascular: RRR, S1/S2 +, no rubs, no gallops Respiratory: CTA bilaterally, no wheezing, no rhonchi Abdominal: Soft, NT, ND, bowel sounds + Extremities: no edema, no cyanosis   The results of significant diagnostics from this hospitalization (including imaging, microbiology, ancillary and laboratory) are listed below for reference.     Microbiology: No results found for this or any previous visit (from the past 240 hour(s)).   Labs: BNP (last 3 results) No results for input(s): BNP in the last 8760 hours. Basic Metabolic Panel:  Recent Labs Lab 10/19/16 1817 10/19/16 2228 10/20/16 0226 10/20/16 1014 10/21/16 0219  NA 135 136 136 135 136  K 4.3 3.8 4.5 5.0 4.2  CL 104 105 106 104 105  CO2 19* 22 20* 19* 24  GLUCOSE 219* 135* 188* 195* 193*  BUN '13 14 15 17 16  ' CREATININE 1.20 1.18 1.26* 1.25* 1.00  CALCIUM 8.4* 8.5* 8.5* 8.9 8.6*   Liver Function Tests:  Recent Labs Lab 10/19/16 1817  AST 20  ALT 19  ALKPHOS 76  BILITOT 1.4*  PROT 6.0*  ALBUMIN 3.5   No results for input(s): LIPASE, AMYLASE in the last 168 hours. No results for input(s): AMMONIA in  the last 168 hours. CBC:  Recent Labs Lab 10/19/16 1246  WBC 11.6*  HGB 15.7  HCT 46.2  MCV 92.6  PLT 265   Cardiac Enzymes: No results for input(s): CKTOTAL, CKMB, CKMBINDEX, TROPONINI in the last 168 hours. BNP: Invalid input(s): POCBNP CBG:  Recent Labs Lab 10/20/16 1636 10/20/16 2118 10/21/16 0356 10/21/16 0613 10/21/16 1200  GLUCAP 162* 183* 199* 178* 143*   D-Dimer No results for input(s): DDIMER in the last 72 hours. Hgb A1c  Recent Labs  10/19/16 1817  HGBA1C 13.6*   Lipid Profile  Recent Labs  10/20/16 0226  CHOL 173  HDL 29*  LDLCALC 104*  TRIG 201*  CHOLHDL 6.0   Thyroid function studies  Recent Labs  10/20/16 1014  TSH 0.525   Anemia work up No results for input(s): VITAMINB12, FOLATE, FERRITIN, TIBC, IRON, RETICCTPCT in the last 72 hours. Urinalysis    Component Value Date/Time   COLORURINE YELLOW 10/19/2016 1512   APPEARANCEUR CLEAR 10/19/2016 1512   LABSPEC 1.032 (H) 10/19/2016 1512   PHURINE 5.0 10/19/2016 1512   GLUCOSEU >=500 (A) 10/19/2016 1512   HGBUR NEGATIVE 10/19/2016 1512   BILIRUBINUR NEGATIVE 10/19/2016 1512   KETONESUR 80 (A) 10/19/2016 1512   PROTEINUR NEGATIVE 10/19/2016 1512   NITRITE NEGATIVE 10/19/2016 1512   LEUKOCYTESUR NEGATIVE 10/19/2016 1512   Sepsis Labs Invalid input(s): PROCALCITONIN,  WBC,  LACTICIDVEN Microbiology No results found for this or any previous visit (from the past 240 hour(s)).  Time coordinating discharge: 45 mins  SIGNED:  Irwin Brakeman, MD  [facility] 10/21/2016, 1:41 PM Pager [phone]  If 7PM-7AM, please contact night-coverage [email or web address] Password [email or web address]

## 2016-10-21 NOTE — Progress Notes (Signed)
Spoke with RN Grant Fontana by phone reviewing diabetes education plans. Patient has been administering insulin by syringe and doing well. RN Lauren plans to review insulin pen with patient utilizing pen station on the unit. Spoke with patient and answered questions regarding insulin and nutrition questions. Reviewed symptoms and treatment of hypoglycemia. Also answered questions regarding Lantus insulin administration.  Thank you, Nani Gasser. Miriah Maruyama, RN, MSN, CDE  Diabetes Coordinator Inpatient Glycemic Control Team Team Pager [phone] (8am-5pm) 10/21/2016 4:57 PM

## 2016-10-21 NOTE — Progress Notes (Signed)
Pt discharged home with son. IV and telemetry box removed per order. Pt received discharge instructions and all questions were answered. Pt was instructed to pick up his insulin, needles, meter, and metformin at his pharmacy. Pt was instructed on when to check his sugar, when to give insulin, what foods to avoid, etc. Pt verbalized understanding and verified that all questions were answered. Pt discharged with all of his belongings. Pt left the unit via wheelchair and was accompanied by a nurse tech and pt's son.   Grant Fontana BSN, RN

## 2016-10-22 DIAGNOSIS — R001 Bradycardia, unspecified: Secondary | ICD-10-CM | POA: Diagnosis not present

## 2016-10-22 DIAGNOSIS — R51 Headache: Secondary | ICD-10-CM | POA: Diagnosis not present

## 2016-10-22 DIAGNOSIS — R110 Nausea: Secondary | ICD-10-CM | POA: Diagnosis not present

## 2016-10-22 DIAGNOSIS — R42 Dizziness and giddiness: Secondary | ICD-10-CM | POA: Diagnosis not present

## 2016-10-22 LAB — GLUTAMIC ACID DECARBOXYLASE AUTO ABS: Glutamic Acid Decarb Ab: <5 U/mL (ref 0.0–5.0)

## 2016-10-22 LAB — VITAMIN D 25 HYDROXY (VIT D DEFICIENCY, FRACTURES): Vit D, 25-Hydroxy: 17.4 ng/mL — ABNORMAL LOW (ref 30.0–100.0)

## 2016-10-22 LAB — ANTI-ISLET CELL ANTIBODY: Pancreatic Islet Cell Antibody: NEGATIVE

## 2016-10-22 NOTE — Consult Note (Addendum)
           [facility] CM Primary Care Navigator  10/22/2016  [name] [facility] Jun 23, 1950 [id number]   Wentto seepatient at the bedsideto identify possible discharge needs but he was already discharged per staff report.  Patient was discharged home yesterday.  Primary care provider's officeis listed as doing transition of care (TOC).  Patient has a discharge instruction to follow-up with primary care provider in a week.   For questions, please contact:  Dannielle Huh, BSN, RN- [facility] Primary Care Navigator  Telephone: [phone] [place]

## 2016-10-24 DIAGNOSIS — H25813 Combined forms of age-related cataract, bilateral: Secondary | ICD-10-CM | POA: Diagnosis not present

## 2016-10-24 DIAGNOSIS — E119 Type 2 diabetes mellitus without complications: Secondary | ICD-10-CM | POA: Diagnosis not present

## 2016-10-27 LAB — INSULIN ANTIBODIES, BLOOD: Insulin Antibodies, Human: <5 uU/mL

## 2016-10-30 DIAGNOSIS — E1165 Type 2 diabetes mellitus with hyperglycemia: Secondary | ICD-10-CM | POA: Diagnosis not present

## 2016-10-30 DIAGNOSIS — H539 Unspecified visual disturbance: Secondary | ICD-10-CM | POA: Diagnosis not present

## 2016-10-30 DIAGNOSIS — R51 Headache: Secondary | ICD-10-CM | POA: Diagnosis not present

## 2016-10-30 DIAGNOSIS — Z09 Encounter for follow-up examination after completed treatment for conditions other than malignant neoplasm: Secondary | ICD-10-CM | POA: Diagnosis not present

## 2016-11-28 DIAGNOSIS — E1165 Type 2 diabetes mellitus with hyperglycemia: Secondary | ICD-10-CM | POA: Diagnosis not present

## 2016-12-25 DIAGNOSIS — Z23 Encounter for immunization: Secondary | ICD-10-CM | POA: Diagnosis not present

## 2016-12-25 DIAGNOSIS — I2510 Atherosclerotic heart disease of native coronary artery without angina pectoris: Secondary | ICD-10-CM | POA: Diagnosis not present

## 2016-12-25 DIAGNOSIS — E1165 Type 2 diabetes mellitus with hyperglycemia: Secondary | ICD-10-CM | POA: Diagnosis not present

## 2016-12-25 DIAGNOSIS — E782 Mixed hyperlipidemia: Secondary | ICD-10-CM | POA: Diagnosis not present

## 2016-12-25 DIAGNOSIS — Z0000 Encounter for general adult medical examination without abnormal findings: Secondary | ICD-10-CM | POA: Diagnosis not present

## 2017-01-01 DIAGNOSIS — E782 Mixed hyperlipidemia: Secondary | ICD-10-CM | POA: Diagnosis not present

## 2017-01-01 DIAGNOSIS — I10 Essential (primary) hypertension: Secondary | ICD-10-CM | POA: Diagnosis not present

## 2017-01-01 DIAGNOSIS — L4050 Arthropathic psoriasis, unspecified: Secondary | ICD-10-CM | POA: Diagnosis not present

## 2017-01-01 DIAGNOSIS — E1165 Type 2 diabetes mellitus with hyperglycemia: Secondary | ICD-10-CM | POA: Diagnosis not present

## 2017-01-01 DIAGNOSIS — I2510 Atherosclerotic heart disease of native coronary artery without angina pectoris: Secondary | ICD-10-CM | POA: Diagnosis not present

## 2017-03-11 ENCOUNTER — Other Ambulatory Visit: Payer: Self-pay | Admitting: Cardiology

## 2017-03-11 NOTE — Telephone Encounter (Signed)
Bisoprolol refill refused. D/c'ed after 10/2016 hospital visit

## 2017-03-12 ENCOUNTER — Other Ambulatory Visit: Payer: Self-pay | Admitting: Cardiology

## 2017-03-12 NOTE — Telephone Encounter (Signed)
°*  STAT* If patient is at the pharmacy, call can be transferred to refill team.   1. Which medications need to be refilled? (please list name of each medication and dose if known) Bisoprolol Fumarate 5 MG  2. Which pharmacy/location (including street and city if local pharmacy) is medication to be sent to? [facility] [address] [place], [place]  3. Do they need a 30 day or 90 day supply?

## 2017-03-12 NOTE — Telephone Encounter (Signed)
P wanted you to know he is still taking Bisoprolo(was not sure of the spelling)

## 2017-03-12 NOTE — Telephone Encounter (Signed)
LM for patient that med will not be refilled as it was discontinued at Sept 2018 hospitalization.

## 2017-03-22 ENCOUNTER — Other Ambulatory Visit: Payer: Self-pay | Admitting: Cardiology

## 2017-03-22 NOTE — Telephone Encounter (Signed)
REFILL 

## 2017-03-26 DIAGNOSIS — E782 Mixed hyperlipidemia: Secondary | ICD-10-CM | POA: Diagnosis not present

## 2017-03-26 DIAGNOSIS — I2510 Atherosclerotic heart disease of native coronary artery without angina pectoris: Secondary | ICD-10-CM | POA: Diagnosis not present

## 2017-03-26 DIAGNOSIS — E1165 Type 2 diabetes mellitus with hyperglycemia: Secondary | ICD-10-CM | POA: Diagnosis not present

## 2017-03-28 MED ORDER — BISOPROLOL FUMARATE 5 MG PO TABS: 5.0000 mg | ORAL_TABLET | Freq: Every day | ORAL | 6 refills | Status: DC

## 2017-03-28 NOTE — Telephone Encounter (Signed)
Bisoprolol 5 mg refill sent to pharmacy.

## 2017-03-28 NOTE — Addendum Note (Signed)
Addended by: Kathyrn Lass on: 03/28/2017 01:13 PM   Modules accepted: Orders

## 2017-04-02 DIAGNOSIS — E782 Mixed hyperlipidemia: Secondary | ICD-10-CM | POA: Diagnosis not present

## 2017-04-02 DIAGNOSIS — E1165 Type 2 diabetes mellitus with hyperglycemia: Secondary | ICD-10-CM | POA: Diagnosis not present

## 2017-04-02 DIAGNOSIS — I10 Essential (primary) hypertension: Secondary | ICD-10-CM | POA: Diagnosis not present

## 2017-04-02 DIAGNOSIS — I2510 Atherosclerotic heart disease of native coronary artery without angina pectoris: Secondary | ICD-10-CM | POA: Diagnosis not present

## 2017-04-10 ENCOUNTER — Telehealth: Payer: Self-pay | Admitting: Cardiology

## 2017-04-10 ENCOUNTER — Other Ambulatory Visit: Payer: Self-pay | Admitting: *Deleted

## 2017-04-10 MED ORDER — EZETIMIBE 10 MG PO TABS: 10.0000 mg | ORAL_TABLET | Freq: Every day | ORAL | 0 refills | Status: DC

## 2017-04-10 MED ORDER — BISOPROLOL FUMARATE 5 MG PO TABS: 5.0000 mg | ORAL_TABLET | Freq: Every day | ORAL | 6 refills | Status: DC

## 2017-04-10 MED ORDER — FENOFIBRATE 145 MG PO TABS: 145.0000 mg | ORAL_TABLET | Freq: Every day | ORAL | 0 refills | Status: DC

## 2017-04-10 NOTE — Telephone Encounter (Signed)
The pt says his pharmacy is [facility] in [place]

## 2017-04-10 NOTE — Telephone Encounter (Signed)
Patient called and stated that his bisoprolol rx was sent to the wrong pharmacy as he has not used [facility] in [facility] years. He would like this to be sent to [place]. Thanks, MI

## 2017-04-11 ENCOUNTER — Telehealth: Payer: Self-pay | Admitting: Cardiology

## 2017-04-11 NOTE — Telephone Encounter (Signed)
Shawn Hernandez is returning a call about a prescription being sent in

## 2017-04-23 ENCOUNTER — Other Ambulatory Visit (HOSPITAL_COMMUNITY): Payer: Self-pay | Admitting: Internal Medicine

## 2017-04-23 DIAGNOSIS — I2510 Atherosclerotic heart disease of native coronary artery without angina pectoris: Secondary | ICD-10-CM | POA: Diagnosis not present

## 2017-04-23 DIAGNOSIS — R1032 Left lower quadrant pain: Secondary | ICD-10-CM | POA: Diagnosis not present

## 2017-04-23 DIAGNOSIS — E1165 Type 2 diabetes mellitus with hyperglycemia: Secondary | ICD-10-CM | POA: Diagnosis not present

## 2017-04-23 DIAGNOSIS — L409 Psoriasis, unspecified: Secondary | ICD-10-CM | POA: Diagnosis not present

## 2017-04-23 DIAGNOSIS — I10 Essential (primary) hypertension: Secondary | ICD-10-CM | POA: Diagnosis not present

## 2017-04-23 DIAGNOSIS — K4090 Unilateral inguinal hernia, without obstruction or gangrene, not specified as recurrent: Secondary | ICD-10-CM | POA: Diagnosis not present

## 2017-04-23 DIAGNOSIS — E782 Mixed hyperlipidemia: Secondary | ICD-10-CM | POA: Diagnosis not present

## 2017-04-25 ENCOUNTER — Ambulatory Visit (HOSPITAL_COMMUNITY): Payer: PPO

## 2017-04-26 ENCOUNTER — Ambulatory Visit (HOSPITAL_COMMUNITY)
Admission: RE | Admit: 2017-04-26 | Discharge: 2017-04-26 | Disposition: A | Discharge status: Home / Self Care | Payer: PPO | Source: Ambulatory Visit | Attending: Internal Medicine | Admitting: Internal Medicine

## 2017-04-26 DIAGNOSIS — K5730 Diverticulosis of large intestine without perforation or abscess without bleeding: Secondary | ICD-10-CM | POA: Insufficient documentation

## 2017-04-26 DIAGNOSIS — N200 Calculus of kidney: Secondary | ICD-10-CM | POA: Diagnosis not present

## 2017-04-26 DIAGNOSIS — K439 Ventral hernia without obstruction or gangrene: Secondary | ICD-10-CM | POA: Diagnosis not present

## 2017-04-26 DIAGNOSIS — R1032 Left lower quadrant pain: Secondary | ICD-10-CM | POA: Diagnosis not present

## 2017-04-26 DIAGNOSIS — I700 Atherosclerosis of aorta: Secondary | ICD-10-CM | POA: Diagnosis not present

## 2017-06-11 ENCOUNTER — Other Ambulatory Visit: Payer: Self-pay | Admitting: Cardiology

## 2017-06-16 ENCOUNTER — Other Ambulatory Visit: Payer: Self-pay | Admitting: Cardiology

## 2017-07-03 ENCOUNTER — Other Ambulatory Visit: Payer: Self-pay | Admitting: Cardiology

## 2017-07-16 ENCOUNTER — Ambulatory Visit: Payer: PPO | Admitting: Physician Assistant

## 2017-07-16 ENCOUNTER — Telehealth: Payer: Self-pay | Admitting: *Deleted

## 2017-07-16 ENCOUNTER — Encounter: Payer: Self-pay | Admitting: Physician Assistant

## 2017-07-16 VITALS — BP 140/82 | HR 59 | Ht 72.0 in | Wt 250.0 lb

## 2017-07-16 DIAGNOSIS — I4892 Unspecified atrial flutter: Secondary | ICD-10-CM | POA: Diagnosis not present

## 2017-07-16 DIAGNOSIS — E785 Hyperlipidemia, unspecified: Secondary | ICD-10-CM | POA: Diagnosis not present

## 2017-07-16 DIAGNOSIS — I10 Essential (primary) hypertension: Secondary | ICD-10-CM | POA: Diagnosis not present

## 2017-07-16 DIAGNOSIS — I2510 Atherosclerotic heart disease of native coronary artery without angina pectoris: Secondary | ICD-10-CM

## 2017-07-16 DIAGNOSIS — R002 Palpitations: Secondary | ICD-10-CM

## 2017-07-16 DIAGNOSIS — R42 Dizziness and giddiness: Secondary | ICD-10-CM

## 2017-07-16 DIAGNOSIS — E7800 Pure hypercholesterolemia, unspecified: Secondary | ICD-10-CM

## 2017-07-16 MED ORDER — BISOPROLOL FUMARATE 5 MG PO TABS: 5.0000 mg | ORAL_TABLET | Freq: Every day | ORAL | 3 refills | Status: DC

## 2017-07-16 MED ORDER — FENOFIBRATE 145 MG PO TABS: 145.0000 mg | ORAL_TABLET | Freq: Every day | ORAL | 3 refills | Status: DC

## 2017-07-16 MED ORDER — EZETIMIBE 10 MG PO TABS: 10.0000 mg | ORAL_TABLET | Freq: Every day | ORAL | 3 refills | Status: DC

## 2017-07-16 NOTE — Progress Notes (Signed)
Cardiology Office Note   Date:  07/16/2017   ID:  ERVINE Hernandez, DOB [birth year], MRN [id number]  PCP:  [name], MD  Cardiologist: Dr. Martinique, 03/30/2016 Rosaria Ferries, PA-C   Chief Complaint  Patient presents with  . Follow-up  . Palpitations  . Dizziness    History of Present Illness: Shawn Hernandez is a 67 y.o. male with a history of BMS x 2 OM 02/2008 w/ residual LAD 50% and RCA 70%, HLD intol statins/niacin>>Zetia/fenofibrate, unable to afford PCSK9; psoriatic arthritis, obesity, paroxysmal atrial flutter, diverticulosis DM dx 10/2016.  Shawn Hernandez presents for cardiology follow up.   He is constantly dizzy. It is worse when he turns rapidly or bends over and stands up.  He gets on the treadmill, his heart rate will go into the 80s. He will get some tingling in his arm while walking on the treadmill.  He is aware that his heart rate is low, wonders if that is causing the dizziness  BP at home is 120s-130s/60s-70s.   He has had 2 episodes within the last 4 months of tachypalpitations. Like his previous Atrial flutter sx. Started with minimal exertion or at rest. Each lasted < 30 minutes.   With the elevated HR, he got pain under his shoulder blade and more tingling in his arm. These sx reminded him of his pre-stent sx.   His A1c is down to 7.1. He is doing better with a diabetic diet. Is off the insulin.   He uses the treadmill 10-15 minutes 3-5 x week. He is focusing on fat burning.   He can climb stairs without stopping.   He does not feel limited by his heart or his breathing.    Past Medical History:  Diagnosis Date  . Allergy to iodine   . Arthritis    discomfort in hands and arms  . Atrial flutter, paroxysmal ([place])   . Coronary artery disease   . Diabetes type 2, controlled ([place]) 10/2016  . Diverticulosis   . Hypercholesterolemia   . Hypertension   . Psoriatic arthritis ([place])   . Sinusitis     Past Surgical History:    Procedure Laterality Date  . COLON SURGERY  2001   2 feet removed re- diverticulosis  . CORONARY STENT PLACEMENT  2011   sequential bare-metal  . INGUINAL HERNIA REPAIR    . OTHER SURGICAL HISTORY     appendectomy    Current Outpatient Medications  Medication Sig Dispense Refill  . acetaminophen (TYLENOL) 325 MG tablet Take 650 mg by mouth every 6 (six) hours as needed for mild pain.    . bisoprolol (ZEBETA) 5 MG tablet Take 1 tablet (5 mg total) by mouth daily. 30 tablet 6  . blood glucose meter kit and supplies Dispense based on patient and insurance preference. Use up to four times daily as directed. (FOR ICD-10 E11.10). 1 each 0  . Cholecalciferol (VITAMIN D) 2000 units CAPS Take 1 capsule by mouth daily.    Marland Kitchen ezetimibe (ZETIA) 10 MG tablet Take 1 tablet (10 mg total) by mouth daily. NEED OV. 90 tablet 0  . fenofibrate (TRICOR) 145 MG tablet TAKE 1 TABLET BY MOUTH ONCE DAILY 30 tablet 0  . Magnesium 400 MG TABS Take 1 tablet by mouth every morning.    . metFORMIN (GLUCOPHAGE XR) 500 MG 24 hr tablet Take 1 tablet (500 mg total) by mouth 2 (two) times daily with a meal. 60 tablet 0   No current  facility-administered medications for this visit.     Allergies:   Ivp dye [iodinated diagnostic agents]; Lisinopril; Statins; and Iodine    Social History:  The patient  reports that he has never smoked. He has never used smokeless tobacco.   Family History:  The patient's family history includes Diabetes in his brother; Heart disease in his brother, father, and mother.    ROS:  Please see the history of present illness. All other systems are reviewed and negative.    PHYSICAL EXAM: VS:  BP 140/82   Pulse (!) 59   Ht 6' (1.829 m)   Wt 250 lb (113.4 kg)   BMI 33.91 kg/m  , BMI Body mass index is 33.91 kg/m. GEN: Well nourished, well developed, male in no acute distress  HEENT: normal for age  Neck: no JVD, no carotid bruit, no masses Cardiac: RRR; no murmur, no rubs, or  gallops Respiratory:  clear to auscultation bilaterally, normal work of breathing GI: soft, nontender, nondistended, + BS MS: no deformity or atrophy; no edema; distal pulses are 2+ in all 4 extremities   Skin: warm and dry, no rash Neuro:  Strength and sensation are intact Psych: euthymic mood, full affect   EKG:  EKG is ordered today. The ekg ordered today demonstrates sinus bradycardia, heart rate 59, no acute ischemic changes, minor morphology changes from 10/19/2016 of unclear significance   Recent Labs: 10/19/2016: ALT 19; Hemoglobin 15.7; Platelets 265 10/20/2016: TSH 0.525 10/21/2016: BUN 16; Creatinine, Ser 1.00; Potassium 4.2; Sodium 136    Lipid Panel    Component Value Date/Time   CHOL 173 10/20/2016 0226   TRIG 201 (H) 10/20/2016 0226   HDL 29 (L) 10/20/2016 0226   CHOLHDL 6.0 10/20/2016 0226   VLDL 40 10/20/2016 0226   LDLCALC 104 (H) 10/20/2016 0226   LDLDIRECT 107.6 08/10/2011 1136     Wt Readings from Last 3 Encounters:  07/16/17 250 lb (113.4 kg)  10/20/16 211 lb 6.4 oz (95.9 kg)  03/30/16 256 lb (116.1 kg)     Other studies Reviewed: Additional studies/ records that were reviewed today include: Office notes, hospital records and testing.  ASSESSMENT AND PLAN:  1.  Paroxysmal atrial flutter, palpitations: He has had 2 episodes recently, greater than 30 days apart.  Therefore, I do not feel an event monitor is likely going to catch anything.  During one episode, his heart rate was over 150, for the other when it was 91.   -Because he has resting bradycardia, I will not increase the bisoprolol. -He has a history of paroxysmal atrial flutter, but the episodes were rare and he was always symptomatic, so has never been anticoagulated. -I discussed a loop recorder, but he prefers to hold off for now. -I also showed him [employer] and he will think about that.  However, he does not currently have a smart phone.  2.  Left scapular pain and left arm tingling,  possible anginal equivalent: -He feels the symptoms are very similar to the symptoms he had prior to his 2 stents. -He feels he can reliably induce the symptoms by exerting himself and getting his heart rate up. -He also had them with his episodes of tachycardia -He has not had a stress test since his heart catheterization in 2010 -We will discuss with Dr. Martinique whether a stress test or heart catheterization is indicated. - The risks and benefits of a cardiac catheterization including, but not limited to, death, stroke, MI, kidney damage and bleeding were  discussed with the patient who indicates understanding and agrees to proceed.  -Of Dr. Martinique prefers cath, he will need to be treated for a dye allergy.  3.  Dizziness: Mr. Falzone can reliably induce the symptoms by turning his head back and forth rapidly.  I explained that is most likely to be in her ear, he has had problems with inner ear before. -He also has some orthostatic dizziness, he admits he may not be drinking enough water and is encouraged to drink more. -I explained that since he is able to increase his heart rate with exertion, and the dizziness does not come on when he is exerting himself, I do not think is related to his low heart rate.  4.  CAD: He is on a beta-blocker, Zetia and TriCor. -When Dr. Martinique saw him February 2018, he was on aspirin.  When he was admitted 10/19/2016 with DKA, he was not.  The reason for this is unclear. -This was not noted until after he left the office, we will contact him to restart aspirin.  5.  Hyperlipidemia: His LDL was 102 when it was checked last September. -It needs to be checked again this September, by [place] or by his PCP.   Current medicines are reviewed at length with the patient today.  The patient has concerns regarding medicines.  Concerns were addressed  The following changes have been made: Restart aspirin  Labs/ tests ordered today include:  No orders of the defined types  were placed in this encounter.    Disposition:   FU with Dr. Martinique  Signed, Rosaria Ferries, PA-C  07/16/2017 1:38 PM    [facility] Phone: [phone]; Fax: [phone]  This note was written with the assistance of speech recognition software. Please excuse any transcriptional errors.

## 2017-07-16 NOTE — Telephone Encounter (Signed)
Patient seen today in the office by Suanne Marker B PA. She discussed with Dr Martinique patient and patient needs Stress myoview (preferred) if he will hold Bisoprolol morning of test. If he will not ok to order [place]. Plus patient needs to start ASA 81 mg daily. Left message to call back

## 2017-07-16 NOTE — Patient Instructions (Signed)
Medication Instructions:  Your physician recommends that you continue on your current medications as directed. Please refer to the Current Medication list given to you today.  Labwork: none  Testing/Procedures: WILL BE IN TOUCH WITH YOU REGARDING WHICH TEST DR Martinique WANTS FOR YOU TO HAVE   Follow-Up: Your physician recommends that you schedule a follow-up appointment in: NEXT AVAILABLE   If you need a refill on your cardiac medications before your next appointment, please call your pharmacy.

## 2017-07-17 ENCOUNTER — Ambulatory Visit: Payer: PPO

## 2017-07-17 NOTE — Telephone Encounter (Signed)
Advised patient, verbalized understanding.   Per patient he is having "palpitations" with HR of 92 this am and has been going on for about 3 hours. Does feel like when he has had Atrial flutter in the past. Patient will come in now for EKG and will discuss with Dr Martinique

## 2017-07-17 NOTE — Telephone Encounter (Signed)
Follow up ° °Pt returning call for nurse °

## 2017-07-17 NOTE — Telephone Encounter (Signed)
Id Spoke with patient and his HR came back down so he did not come for EKG. Discussed with Dr Martinique and since 2 episodes in the last month get 30 day event monitor. Advised patient and he is agreeable to plan. Message sent to [facility] to call and get appointments scheduled.

## 2017-07-17 NOTE — Telephone Encounter (Signed)
Left message to call back  

## 2017-07-18 ENCOUNTER — Encounter: Payer: Self-pay | Admitting: Cardiology

## 2017-07-19 ENCOUNTER — Telehealth (HOSPITAL_COMMUNITY): Payer: Self-pay

## 2017-07-19 ENCOUNTER — Telehealth: Payer: Self-pay | Admitting: Cardiovascular Disease

## 2017-07-19 NOTE — Telephone Encounter (Signed)
Encounter complete. 

## 2017-07-19 NOTE — Telephone Encounter (Signed)
New Message:      Pt would like to know if he can come in today and get and EKG.

## 2017-07-19 NOTE — Telephone Encounter (Signed)
Received call from patient he stated his heart is beating fast,he feels bad,perspiring heavy.No chest pain.Advised to go to [facility] ED.Trish notified.

## 2017-07-23 ENCOUNTER — Ambulatory Visit (HOSPITAL_COMMUNITY)
Admission: RE | Admit: 2017-07-23 | Discharge: 2017-07-23 | Disposition: A | Discharge status: Home / Self Care | Payer: PPO | Source: Ambulatory Visit | Attending: Cardiovascular Disease | Admitting: Cardiovascular Disease

## 2017-07-23 DIAGNOSIS — R42 Dizziness and giddiness: Secondary | ICD-10-CM

## 2017-07-23 DIAGNOSIS — R002 Palpitations: Secondary | ICD-10-CM

## 2017-07-23 DIAGNOSIS — E7800 Pure hypercholesterolemia, unspecified: Secondary | ICD-10-CM

## 2017-07-23 DIAGNOSIS — I10 Essential (primary) hypertension: Secondary | ICD-10-CM | POA: Diagnosis not present

## 2017-07-23 DIAGNOSIS — I2510 Atherosclerotic heart disease of native coronary artery without angina pectoris: Secondary | ICD-10-CM

## 2017-07-23 LAB — MYOCARDIAL PERFUSION IMAGING
LV dias vol: 96 mL (ref 62–150)
LV sys vol: 44 mL
Peak HR: 90 {beats}/min
Rest HR: 80 {beats}/min
SDS: 0
SRS: 0
SSS: 0
TID: 0.88

## 2017-07-23 MED ORDER — TECHNETIUM TC 99M TETROFOSMIN IV KIT
32.0000 | PACK | Freq: Once | INTRAVENOUS | Status: AC | PRN
  Administered 2017-07-23: 32 via INTRAVENOUS
  Filled 2017-07-23: qty 32

## 2017-07-23 MED ORDER — TECHNETIUM TC 99M TETROFOSMIN IV KIT
9.9000 | PACK | Freq: Once | INTRAVENOUS | Status: AC | PRN
  Administered 2017-07-23: 9.9 via INTRAVENOUS
  Filled 2017-07-23: qty 10

## 2017-07-23 MED ORDER — REGADENOSON 0.4 MG/5ML IV SOLN
0.4000 mg | Freq: Once | INTRAVENOUS | Status: AC
  Administered 2017-07-23: 0.4 mg via INTRAVENOUS

## 2017-07-24 ENCOUNTER — Inpatient Hospital Stay (HOSPITAL_COMMUNITY): Admission: RE | Admit: 2017-07-24 | Payer: PPO | Source: Ambulatory Visit

## 2017-07-30 ENCOUNTER — Ambulatory Visit (INDEPENDENT_AMBULATORY_CARE_PROVIDER_SITE_OTHER): Payer: PPO

## 2017-07-30 DIAGNOSIS — R002 Palpitations: Secondary | ICD-10-CM

## 2017-07-30 DIAGNOSIS — R42 Dizziness and giddiness: Secondary | ICD-10-CM | POA: Diagnosis not present

## 2017-08-14 ENCOUNTER — Telehealth: Payer: Self-pay | Admitting: *Deleted

## 2017-08-14 DIAGNOSIS — I4892 Unspecified atrial flutter: Secondary | ICD-10-CM

## 2017-08-14 MED ORDER — APIXABAN 5 MG PO TABS: 5.0000 mg | ORAL_TABLET | Freq: Two times a day (BID) | ORAL | 5 refills | Status: DC

## 2017-08-14 NOTE — Telephone Encounter (Addendum)
Received monitor report from [employer] regarding patient. Reviewed by Dr Martinique, recurrent Atrial Flutter with HR of 130. Unable to titrate beta blockers due to bradycardia. Recommend starting Eliquis 5 mg twice a day twice a day, [place] VASC 3.4, STOP ASA. Refer to EP. Advised patient, verbalized understanding, Rx sent to pharmacy and referral placed.

## 2017-08-20 DIAGNOSIS — E1165 Type 2 diabetes mellitus with hyperglycemia: Secondary | ICD-10-CM | POA: Diagnosis not present

## 2017-08-20 DIAGNOSIS — I2510 Atherosclerotic heart disease of native coronary artery without angina pectoris: Secondary | ICD-10-CM | POA: Diagnosis not present

## 2017-08-20 DIAGNOSIS — E782 Mixed hyperlipidemia: Secondary | ICD-10-CM | POA: Diagnosis not present

## 2017-08-27 DIAGNOSIS — E782 Mixed hyperlipidemia: Secondary | ICD-10-CM | POA: Diagnosis not present

## 2017-08-27 DIAGNOSIS — E1165 Type 2 diabetes mellitus with hyperglycemia: Secondary | ICD-10-CM | POA: Diagnosis not present

## 2017-08-27 DIAGNOSIS — I2510 Atherosclerotic heart disease of native coronary artery without angina pectoris: Secondary | ICD-10-CM | POA: Diagnosis not present

## 2017-08-27 DIAGNOSIS — I10 Essential (primary) hypertension: Secondary | ICD-10-CM | POA: Diagnosis not present

## 2017-09-10 ENCOUNTER — Encounter: Payer: Self-pay | Admitting: Cardiology

## 2017-09-10 ENCOUNTER — Encounter (INDEPENDENT_AMBULATORY_CARE_PROVIDER_SITE_OTHER): Payer: Self-pay

## 2017-09-10 ENCOUNTER — Ambulatory Visit: Payer: PPO | Admitting: Cardiology

## 2017-09-10 VITALS — BP 132/86 | HR 64 | Ht 72.0 in | Wt 245.2 lb

## 2017-09-10 DIAGNOSIS — E785 Hyperlipidemia, unspecified: Secondary | ICD-10-CM

## 2017-09-10 DIAGNOSIS — I2510 Atherosclerotic heart disease of native coronary artery without angina pectoris: Secondary | ICD-10-CM | POA: Diagnosis not present

## 2017-09-10 DIAGNOSIS — I483 Typical atrial flutter: Secondary | ICD-10-CM | POA: Diagnosis not present

## 2017-09-10 NOTE — Patient Instructions (Addendum)
Medication Instructions:  Your physician recommends that you continue on your current medications as directed. Please refer to the Current Medication list given to you today.  * If you need a refill on your cardiac medications before your next appointment, please call your pharmacy.   Labwork: None ordered  Testing/Procedures: Your physician has recommended that you have an ablation. Catheter ablation is a medical procedure used to treat some cardiac arrhythmias (irregular heartbeats). During catheter ablation, a long, thin, flexible tube is put into a blood vessel in your groin (upper thigh), or neck. This tube is called an ablation catheter. It is then guided to your heart through the blood vessel. Radio frequency waves destroy small areas of heart tissue where abnormal heartbeats may cause an arrhythmia to start.   The following dates are available (these are subject to change): 8/30, 9/6, 9/11,  9/12, 9/18, 9/19, 9/26, 9/27  Please call Trinidad Curet, RN @ [phone] when you are ready to schedule  Instructions for your ablation: 1. Please arrive at the [facility] Main Entrance "A[facility] of [facility] at _____  on _____. 2. Do not eat or drink after midnight the night prior to the procedure. 3. Do not miss any doses of ELIQUIS prior to the morning of the procedure.  4. Do not take any medications the morning of the procedure. 5. Both of your groins will need to be shaved for this procedure (if needed). We ask that you do this yourself at home 1-2 days prior to the procedure.  If you are unable/uncomfortable to do yourself, the hospital staff will shave you the day of your procedure (if needed). 6. Plan for an overnight stay in the hospital. 7. You will need someone to drive you home at discharge.      Follow-Up: To be d Cardiac Ablation Cardiac ablation is a procedure to disable (ablate) a small amount of heart tissue in very specific places. The heart has many electrical  connections. Sometimes these connections are abnormal and can cause the heart to beat very fast or irregularly. Ablating some of the problem areas can improve the heart rhythm or return it to normal. Ablation may be done for people who:  Have Wolff-Parkinson-White syndrome.  Have fast heart rhythms (tachycardia).  Have taken medicines for an abnormal heart rhythm (arrhythmia) that were not effective or caused side effects.  Have a high-risk heartbeat that may be life-threatening.  During the procedure, a small incision is made in the neck or the groin, and a long, thin, flexible tube (catheter) is inserted into the incision and moved to the heart. Small devices (electrodes) on the tip of the catheter will send out electrical currents. A type of X-ray (fluoroscopy) will be used to help guide the catheter and to provide images of the heart. Tell a health care provider about:  Any allergies you have.  All medicines you are taking, including vitamins, herbs, eye drops, creams, and over-the-counter medicines.  Any problems you or family members have had with anesthetic medicines.  Any blood disorders you have.  Any surgeries you have had.  Any medical conditions you have, such as kidney failure.  Whether you are pregnant or may be pregnant. What are the risks? Generally, this is a safe procedure. However, problems may occur, including:  Infection.  Bruising and bleeding at the catheter insertion site.  Bleeding into the chest, especially into the sac that surrounds the heart. This is a serious complication.  Stroke or blood clots.  Damage to other structures or organs.  Allergic reaction to medicines or dyes.  Need for a permanent pacemaker if the normal electrical system is damaged. A pacemaker is a small computer that sends electrical signals to the heart and helps your heart beat normally.  The procedure not being fully effective. This may not be recognized until months  later. Repeat ablation procedures are sometimes required.  What happens before the procedure?  Follow instructions from your health care provider about eating or drinking restrictions.  Ask your health care provider about: ? Changing or stopping your regular medicines. This is especially important if you are taking diabetes medicines or blood thinners. ? Taking medicines such as aspirin and ibuprofen. These medicines can thin your blood. Do not take these medicines before your procedure if your health care provider instructs you not to.  Plan to have someone take you home from the hospital or clinic.  If you will be going home right after the procedure, plan to have someone with you for 24 hours. What happens during the procedure?  To lower your risk of infection: ? Your health care team will wash or sanitize their hands. ? Your skin will be washed with soap. ? Hair may be removed from the incision area.  An IV tube will be inserted into one of your veins.  You will be given a medicine to help you relax (sedative).  The skin on your neck or groin will be numbed.  An incision will be made in your neck or your groin.  A needle will be inserted through the incision and into a large vein in your neck or groin.  A catheter will be inserted into the needle and moved to your heart.  Dye may be injected through the catheter to help your surgeon see the area of the heart that needs treatment.  Electrical currents will be sent from the catheter to ablate heart tissue in desired areas. There are three types of energy that may be used to ablate heart tissue: ? Heat (radiofrequency energy). ? Laser energy. ? Extreme cold (cryoablation).  When the necessary tissue has been ablated, the catheter will be removed.  Pressure will be held on the catheter insertion area to prevent excessive bleeding.  A bandage (dressing) will be placed over the catheter insertion area. The procedure may vary  among health care providers and hospitals. What happens after the procedure?  Your blood pressure, heart rate, breathing rate, and blood oxygen level will be monitored until the medicines you were given have worn off.  Your catheter insertion area will be monitored for bleeding. You will need to lie still for a few hours to ensure that you do not bleed from the catheter insertion area.  Do not drive for 24 hours or as long as directed by your health care provider. Summary  Cardiac ablation is a procedure to disable (ablate) a small amount of heart tissue in very specific places. Ablating some of the problem areas can improve the heart rhythm or return it to normal.  During the procedure, electrical currents will be sent from the catheter to ablate heart tissue in desired areas. This information is not intended to replace advice given to you by your health care provider. Make sure you discuss any questions you have with your health care provider. Document Released: 06/10/2008 Document Revised: 12/12/2015 Document Reviewed: 12/12/2015 Elsevier Interactive Patient Education  2018 [employer]. etermined after procedure is scheduled  *Please note that any paperwork needing  to be filled out by the provider will need to be addressed at the front desk prior to seeing the provider. Please note that any FMLA, disability or other documents regarding health condition is subject to a $25.00 charge that must be received prior to completion of paperwork in the form of a money order or check.  Thank you for choosing [facility]!!   Trinidad Curet, RN [phone]  Any Other Special Instructions Will Be Listed Below (If Applicable).

## 2017-09-10 NOTE — Progress Notes (Signed)
Electrophysiology Office Note   Date:  09/10/2017   ID:  Shawn Hernandez, DOB [birth year], MRN [id number]  PCP:  [name], MD  Cardiologist:  [place] Primary Electrophysiologist:  Constance Haw, MD    No chief complaint on file.    History of Present Illness: Shawn Hernandez is a 68 y.o. male who is being seen today for the evaluation of atrial flutter at the request of [place], Peter M, MD. Presenting today for electrophysiology evaluation.  A history of coronary disease status post bare-metal stent x2 to the OM in 2010 with residual 50% LAD and 70% RCA, hyperlipidemia intolerant to statins, psoriatic arthritis, obesity, paroxysmal atrial flutter, diabetes.  He has had 2 episodes of palpitations in the last 4 months.  They would like his prior atrial flutter symptoms.  It started it with minimal exertion or rest.  With his elevated heart rates, he gets pain in his shoulder blade and tingling in his arm.    Today, he denies symptoms of palpitations, chest pain, shortness of breath, orthopnea, PND, lower extremity edema, claudication, dizziness, presyncope, syncope, bleeding, or neurologic sequela. The patient is tolerating medications without difficulties.    Past Medical History:  Diagnosis Date  . Allergy to iodine   . Arthritis    discomfort in hands and arms  . Atrial flutter, paroxysmal ([place])   . Coronary artery disease   . Diabetes type 2, controlled ([place]) 10/2016  . Diverticulosis   . Hypercholesterolemia   . Hypertension   . Psoriatic arthritis ([place])   . Sinusitis    Past Surgical History:  Procedure Laterality Date  . COLON SURGERY  2001   2 feet removed re- diverticulosis  . CORONARY STENT PLACEMENT  2011   sequential bare-metal  . INGUINAL HERNIA REPAIR    . OTHER SURGICAL HISTORY     appendectomy     Current Outpatient Medications  Medication Sig Dispense Refill  . apixaban (ELIQUIS) 5 MG TABS tablet Take 1 tablet (5 mg total) by mouth 2  (two) times daily. 60 tablet 5  . bisoprolol (ZEBETA) 5 MG tablet Take 1 tablet (5 mg total) by mouth daily. 90 tablet 3  . blood glucose meter kit and supplies Dispense based on patient and insurance preference. Use up to four times daily as directed. (FOR ICD-10 E11.10). 1 each 0  . Cholecalciferol (VITAMIN D) 2000 units CAPS Take 1 capsule by mouth daily.    Marland Kitchen ezetimibe (ZETIA) 10 MG tablet Take 1 tablet (10 mg total) by mouth daily. 90 tablet 3  . fenofibrate (TRICOR) 145 MG tablet Take 1 tablet (145 mg total) by mouth daily. 90 tablet 3  . Magnesium 400 MG TABS Take 1 tablet by mouth every morning.    . metFORMIN (GLUCOPHAGE XR) 500 MG 24 hr tablet Take 1 tablet (500 mg total) by mouth 2 (two) times daily with a meal. 60 tablet 0  . TRULICITY 8.18 EX/9.3ZJ SOPN Inject 0.75 mg into the skin once a week.     No current facility-administered medications for this visit.     Allergies:   Ivp dye [iodinated diagnostic agents]; Lisinopril; Statins; and Iodine   Social History:  The patient  reports that he has never smoked. He has never used smokeless tobacco.   Family History:  The patient's family history includes Diabetes in his brother; Heart disease in his brother, father, and mother.    ROS:  Please see the history of present illness.   Otherwise,  review of systems is positive for fatigue, palpitations, hearing loss, cough, abdominal pain, diarrhea, nausea, balance problems, back pain, dizziness.   All other systems are reviewed and negative.    PHYSICAL EXAM: VS:  BP 132/86   Pulse 64   Ht 6' (1.829 m)   Wt 245 lb 3.2 oz (111.2 kg)   SpO2 94%   BMI 33.26 kg/m  , BMI Body mass index is 33.26 kg/m. GEN: Well nourished, well developed, in no acute distress  HEENT: normal  Neck: no JVD, carotid bruits, or masses Cardiac: RRR; no murmurs, rubs, or gallops,no edema  Respiratory:  clear to auscultation bilaterally, normal work of breathing GI: soft, nontender, nondistended, +  BS MS: no deformity or atrophy  Skin: warm and dry Neuro:  Strength and sensation are intact Psych: euthymic mood, full affect  EKG:  EKG is ordered today. Personal review of the ekg ordered shows anus rhythm, left atrial enlargement, rate 64  Recent Labs: 10/19/2016: ALT 19; Hemoglobin 15.7; Platelets 265 10/20/2016: TSH 0.525 10/21/2016: BUN 16; Creatinine, Ser 1.00; Potassium 4.2; Sodium 136    Lipid Panel     Component Value Date/Time   CHOL 173 10/20/2016 0226   TRIG 201 (H) 10/20/2016 0226   HDL 29 (L) 10/20/2016 0226   CHOLHDL 6.0 10/20/2016 0226   VLDL 40 10/20/2016 0226   LDLCALC 104 (H) 10/20/2016 0226   LDLDIRECT 107.6 08/10/2011 1136     Wt Readings from Last 3 Encounters:  09/10/17 245 lb 3.2 oz (111.2 kg)  07/23/17 250 lb (113.4 kg)  07/16/17 250 lb (113.4 kg)      Other studies Reviewed: Additional studies/ records that were reviewed today include: Myoview 07/23/17 Review of the above records today demonstrates:   The left ventricular ejection fraction is mildly decreased (45-54%).  Nuclear stress EF: 54%.  There was no ST segment deviation noted during stress.  This is a low risk study.  30 day monitor 08/22/17 - personally reviewed  Normal sinus rhythm  PACs with couplets and short runs of PACs.  Isolated PVCs  Atrial flutter with RVR rate as high as 150  Symptoms of fluttering correlate with atrial flutter. symptoms of chest pain/pressure do not consistently correlate with arrhythmia.  ASSESSMENT AND PLAN:  1.  Paroxysmal atrial flutter: Currently on Eliquis.  Discussed with him options of therapy including ablation versus medical management.  Risks and benefits of ablation include bleeding, tamponade, heart block, stroke.  The patient understands these risks and is agreed to the procedure.  No current chest pain.  Continue with current management  This patients CHA2DS2-VASc Score and unadjusted Ischemic Stroke Rate (% per year) is equal  to 2.2 % stroke rate/year from a score of 2  Above score calculated as 1 point each if present [CHF, HTN, DM, Vascular=MI/PAD/Aortic Plaque, Age if 65-74, or Male] Above score calculated as 2 points each if present [Age > 75, or Stroke/TIA/TE]   2.  Coronary artery disease: On beta-blocker, aspirin, Zetia, TriCor.  No current chest pain.  Continue current management.  3.  Hyperlipidemia: Last LDL 102.  Per primary cardiology.    Current medicines are reviewed at length with the patient today.   The patient does not have concerns regarding his medicines.  The following changes were made today:  none  Labs/ tests ordered today include:  Orders Placed This Encounter  Procedures  . EKG 12-Lead     Disposition:   FU with Kiven Vangilder 2 months  Signed,  Tylee Newby Meredith Leeds, MD  09/10/2017 3:13 PM     [address] [place] [address] [phone] (office) [phone] (fax)

## 2017-09-17 ENCOUNTER — Telehealth: Payer: Self-pay | Admitting: Cardiology

## 2017-09-17 NOTE — Telephone Encounter (Signed)
New Message:      Pt is returning a call about an upcoming procedure

## 2017-09-18 NOTE — Telephone Encounter (Signed)
Pt would like 9/11 or 9/12 for procedure date. Informed that I would discuss w/ Camn[place] today on whether he needs anesthesia for his case.  Pt aware that if anesthesia is needed then 9/12 will be the date b/c 9/11 already has case that day. Pt understands I will call him back once discussed w/ Camnitz.

## 2017-09-19 NOTE — Telephone Encounter (Signed)
Left message informing pt I have him scheduled for AFlutter ablation on 9/12. Pt aware I will call him next week to review instructions.

## 2017-10-04 NOTE — Telephone Encounter (Signed)
Pt and I agreed to review next week.

## 2017-10-11 NOTE — Telephone Encounter (Signed)
° ° ° °  Nurse unavailable Patient returning call

## 2017-10-15 DIAGNOSIS — E782 Mixed hyperlipidemia: Secondary | ICD-10-CM | POA: Diagnosis not present

## 2017-10-15 DIAGNOSIS — E1165 Type 2 diabetes mellitus with hyperglycemia: Secondary | ICD-10-CM | POA: Diagnosis not present

## 2017-10-16 NOTE — Anesthesia Preprocedure Evaluation (Addendum)
Anesthesia Evaluation  Patient identified by MRN, date of birth, ID band Patient awake    Reviewed: Allergy & Precautions, NPO status , Patient's Chart, lab work & pertinent test results  Airway Mallampati: III  TM Distance: >3 FB Neck ROM: Full    Dental no notable dental hx. (+) Teeth Intact, Dental Advisory Given   Pulmonary neg pulmonary ROS,    Pulmonary exam normal breath sounds clear to auscultation       Cardiovascular Exercise Tolerance: Good hypertension, Pt. on medications and Pt. on home beta blockers + CAD  Normal cardiovascular exam+ dysrhythmias Atrial Fibrillation  Rhythm:Regular Rate:Normal     Neuro/Psych negative neurological ROS  negative psych ROS   GI/Hepatic negative GI ROS, Neg liver ROS,   Endo/Other  diabetes, Type 2  Renal/GU Renal disease     Musculoskeletal   Abdominal (+) + obese,   Peds  Hematology   Anesthesia Other Findings   Reproductive/Obstetrics                            Lab Results  Component Value Date   CREATININE 1.00 10/21/2016   BUN 16 10/21/2016   NA 136 10/21/2016   K 4.2 10/21/2016   CL 105 10/21/2016   CO2 24 10/21/2016    Lab Results  Component Value Date   WBC 11.6 (H) 10/19/2016   HGB 15.7 10/19/2016   HCT 46.2 10/19/2016   MCV 92.6 10/19/2016   PLT 265 10/19/2016    Anesthesia Physical Anesthesia Plan  ASA: III  Anesthesia Plan: General   Post-op Pain Management:    Induction: Intravenous  PONV Risk Score and Plan: 3 and Treatment may vary due to age or medical condition, Dexamethasone and Ondansetron  Airway Management Planned: Oral ETT  Additional Equipment:   Intra-op Plan:   Post-operative Plan: Extubation in OR  Informed Consent: I have reviewed the patients History and Physical, chart, labs and discussed the procedure including the risks, benefits and alternatives for the proposed anesthesia with the  patient or authorized representative who has indicated his/her understanding and acceptance.   Dental advisory given  Plan Discussed with: CRNA  Anesthesia Plan Comments:         Anesthesia Quick Evaluation

## 2017-10-17 ENCOUNTER — Encounter (HOSPITAL_COMMUNITY): Payer: Self-pay | Admitting: Anesthesiology

## 2017-10-17 ENCOUNTER — Ambulatory Visit (HOSPITAL_COMMUNITY): Payer: PPO | Admitting: Anesthesiology

## 2017-10-17 ENCOUNTER — Other Ambulatory Visit: Payer: Self-pay

## 2017-10-17 ENCOUNTER — Ambulatory Visit (HOSPITAL_COMMUNITY)
Admission: RE | Admit: 2017-10-17 | Discharge: 2017-10-17 | Disposition: A | Discharge status: Home / Self Care | Payer: PPO | Source: Ambulatory Visit | Attending: Cardiology | Admitting: Cardiology

## 2017-10-17 ENCOUNTER — Encounter (HOSPITAL_COMMUNITY)
Admission: RE | Disposition: A | Discharge status: Home / Self Care | Payer: Self-pay | Source: Ambulatory Visit | Attending: Cardiology

## 2017-10-17 DIAGNOSIS — Z7984 Long term (current) use of oral hypoglycemic drugs: Secondary | ICD-10-CM | POA: Insufficient documentation

## 2017-10-17 DIAGNOSIS — E1110 Type 2 diabetes mellitus with ketoacidosis without coma: Secondary | ICD-10-CM | POA: Diagnosis not present

## 2017-10-17 DIAGNOSIS — Z7901 Long term (current) use of anticoagulants: Secondary | ICD-10-CM | POA: Diagnosis not present

## 2017-10-17 DIAGNOSIS — E669 Obesity, unspecified: Secondary | ICD-10-CM | POA: Diagnosis not present

## 2017-10-17 DIAGNOSIS — E7800 Pure hypercholesterolemia, unspecified: Secondary | ICD-10-CM | POA: Insufficient documentation

## 2017-10-17 DIAGNOSIS — I129 Hypertensive chronic kidney disease with stage 1 through stage 4 chronic kidney disease, or unspecified chronic kidney disease: Secondary | ICD-10-CM | POA: Diagnosis not present

## 2017-10-17 DIAGNOSIS — I499 Cardiac arrhythmia, unspecified: Secondary | ICD-10-CM | POA: Insufficient documentation

## 2017-10-17 DIAGNOSIS — L4050 Arthropathic psoriasis, unspecified: Secondary | ICD-10-CM | POA: Diagnosis not present

## 2017-10-17 DIAGNOSIS — Z955 Presence of coronary angioplasty implant and graft: Secondary | ICD-10-CM | POA: Diagnosis not present

## 2017-10-17 DIAGNOSIS — Z6833 Body mass index (BMI) 33.0-33.9, adult: Secondary | ICD-10-CM | POA: Insufficient documentation

## 2017-10-17 DIAGNOSIS — Z79899 Other long term (current) drug therapy: Secondary | ICD-10-CM | POA: Insufficient documentation

## 2017-10-17 DIAGNOSIS — I4892 Unspecified atrial flutter: Secondary | ICD-10-CM | POA: Insufficient documentation

## 2017-10-17 DIAGNOSIS — I2510 Atherosclerotic heart disease of native coronary artery without angina pectoris: Secondary | ICD-10-CM | POA: Insufficient documentation

## 2017-10-17 DIAGNOSIS — M1990 Unspecified osteoarthritis, unspecified site: Secondary | ICD-10-CM | POA: Diagnosis not present

## 2017-10-17 DIAGNOSIS — Z9889 Other specified postprocedural states: Secondary | ICD-10-CM | POA: Diagnosis not present

## 2017-10-17 DIAGNOSIS — N189 Chronic kidney disease, unspecified: Secondary | ICD-10-CM | POA: Insufficient documentation

## 2017-10-17 DIAGNOSIS — E1122 Type 2 diabetes mellitus with diabetic chronic kidney disease: Secondary | ICD-10-CM | POA: Diagnosis not present

## 2017-10-17 HISTORY — PX: A-FLUTTER ABLATION: EP1230

## 2017-10-17 LAB — CBC
HCT: 46.9 % (ref 39.0–52.0)
Hemoglobin: 15.2 g/dL (ref 13.0–17.0)
MCH: 30.6 pg (ref 26.0–34.0)
MCHC: 32.4 g/dL (ref 30.0–36.0)
MCV: 94.6 fL (ref 78.0–100.0)
Platelets: 284 10*3/uL (ref 150–400)
RBC: 4.96 MIL/uL (ref 4.22–5.81)
RDW: 11.9 % (ref 11.5–15.5)
WBC: 8.5 10*3/uL (ref 4.0–10.5)

## 2017-10-17 LAB — BASIC METABOLIC PANEL
Anion gap: 5 (ref 5–15)
BUN: 9 mg/dL (ref 8–23)
CO2: 27 mmol/L (ref 22–32)
Calcium: 9.5 mg/dL (ref 8.9–10.3)
Chloride: 106 mmol/L (ref 98–111)
Creatinine, Ser: 1.4 mg/dL — ABNORMAL HIGH (ref 0.61–1.24)
GFR calc Af Amer: 58 mL/min — ABNORMAL LOW (ref >60)
GFR calc non Af Amer: 50 mL/min — ABNORMAL LOW (ref >60)
Glucose, Bld: 127 mg/dL — ABNORMAL HIGH (ref 70–99)
Potassium: 4.2 mmol/L (ref 3.5–5.1)
Sodium: 138 mmol/L (ref 135–145)

## 2017-10-17 LAB — GLUCOSE, CAPILLARY
Glucose-Capillary: 120 mg/dL — ABNORMAL HIGH (ref 70–99)
Glucose-Capillary: 128 mg/dL — ABNORMAL HIGH (ref 70–99)

## 2017-10-17 SURGERY — A-FLUTTER ABLATION
Anesthesia: General

## 2017-10-17 MED ORDER — DEXAMETHASONE SODIUM PHOSPHATE 10 MG/ML IJ SOLN
INTRAMUSCULAR | Status: DC | PRN
  Administered 2017-10-17: 10 mg via INTRAVENOUS

## 2017-10-17 MED ORDER — MIDAZOLAM HCL 5 MG/5ML IJ SOLN
INTRAMUSCULAR | Status: DC | PRN
  Administered 2017-10-17: 2 mg via INTRAVENOUS

## 2017-10-17 MED ORDER — SUCCINYLCHOLINE CHLORIDE 200 MG/10ML IV SOSY
PREFILLED_SYRINGE | INTRAVENOUS | Status: DC | PRN
  Administered 2017-10-17: 100 mg via INTRAVENOUS

## 2017-10-17 MED ORDER — FENTANYL CITRATE (PF) 250 MCG/5ML IJ SOLN
INTRAMUSCULAR | Status: DC | PRN
  Administered 2017-10-17: 100 ug via INTRAVENOUS

## 2017-10-17 MED ORDER — HEPARIN (PORCINE) IN NACL 1000-0.9 UT/500ML-% IV SOLN
INTRAVENOUS | Status: DC | PRN
  Administered 2017-10-17 (×2): 500 mL

## 2017-10-17 MED ORDER — EPHEDRINE SULFATE-NACL 50-0.9 MG/10ML-% IV SOSY
PREFILLED_SYRINGE | INTRAVENOUS | Status: DC | PRN
  Administered 2017-10-17: 5 mg via INTRAVENOUS
  Administered 2017-10-17: 10 mg via INTRAVENOUS

## 2017-10-17 MED ORDER — SODIUM CHLORIDE 0.9 % IV SOLN: 250.0000 mL | INTRAVENOUS | Status: DC | PRN

## 2017-10-17 MED ORDER — SODIUM CHLORIDE 0.9 % IV SOLN
INTRAVENOUS | Status: DC
  Administered 2017-10-17: 10:00:00 via INTRAVENOUS

## 2017-10-17 MED ORDER — BUPIVACAINE HCL (PF) 0.25 % IJ SOLN
INTRAMUSCULAR | Status: AC
  Filled 2017-10-17: qty 30

## 2017-10-17 MED ORDER — PROPOFOL 10 MG/ML IV BOLUS
INTRAVENOUS | Status: DC | PRN
  Administered 2017-10-17: 200 mg via INTRAVENOUS

## 2017-10-17 MED ORDER — SODIUM CHLORIDE 0.9% FLUSH: 3.0000 mL | Freq: Two times a day (BID) | INTRAVENOUS | Status: DC

## 2017-10-17 MED ORDER — BUPIVACAINE HCL (PF) 0.25 % IJ SOLN
INTRAMUSCULAR | Status: DC | PRN
  Administered 2017-10-17: 30 mL

## 2017-10-17 MED ORDER — HEPARIN (PORCINE) IN NACL 1000-0.9 UT/500ML-% IV SOLN
INTRAVENOUS | Status: AC
  Filled 2017-10-17: qty 500

## 2017-10-17 MED ORDER — LIDOCAINE 2% (20 MG/ML) 5 ML SYRINGE
INTRAMUSCULAR | Status: DC | PRN
  Administered 2017-10-17: 100 mg via INTRAVENOUS

## 2017-10-17 MED ORDER — ONDANSETRON HCL 4 MG/2ML IJ SOLN
INTRAMUSCULAR | Status: DC | PRN
  Administered 2017-10-17: 4 mg via INTRAVENOUS

## 2017-10-17 MED ORDER — ACETAMINOPHEN 325 MG PO TABS
650.0000 mg | ORAL_TABLET | ORAL | Status: DC | PRN
  Filled 2017-10-17: qty 2

## 2017-10-17 MED ORDER — ONDANSETRON HCL 4 MG/2ML IJ SOLN: 4.0000 mg | Freq: Four times a day (QID) | INTRAMUSCULAR | Status: DC | PRN

## 2017-10-17 MED ORDER — SODIUM CHLORIDE 0.9 % IV SOLN
INTRAVENOUS | Status: DC | PRN
  Administered 2017-10-17: 25 ug/min via INTRAVENOUS

## 2017-10-17 MED ORDER — SODIUM CHLORIDE 0.9% FLUSH: 3.0000 mL | INTRAVENOUS | Status: DC | PRN

## 2017-10-17 SURGICAL SUPPLY — 11 items
BLANKET WARM UNDERBOD FULL ACC (MISCELLANEOUS) ×2 IMPLANT
CATH EZ STEER NAV 8MM D-F CUR (ABLATOR) ×2 IMPLANT
CATH JOSEPH QUAD ALLRED 6F REP (CATHETERS) ×2 IMPLANT
CATH WEBSTER BI DIR CS D-F CRV (CATHETERS) ×2 IMPLANT
PACK EP LATEX FREE (CUSTOM PROCEDURE TRAY) ×1
PACK EP LF (CUSTOM PROCEDURE TRAY) ×1 IMPLANT
PAD PRO RADIOLUCENT 2001M-C (PAD) ×2 IMPLANT
PATCH CARTO3 (PAD) ×2 IMPLANT
SHEATH PINNACLE 6F 10CM (SHEATH) ×2 IMPLANT
SHEATH PINNACLE 7F 10CM (SHEATH) ×2 IMPLANT
SHEATH PINNACLE 8F 10CM (SHEATH) ×2 IMPLANT

## 2017-10-17 NOTE — H&P (Signed)
Shawn Hernandez has presented today for surgery, with the diagnosis of atrial flutter.  The various methods of treatment have been discussed with the patient and family. After consideration of risks, benefits and other options for treatment, the patient has consented to  Procedure(s): Catheter ablation as a surgical intervention .  Risks include but not limited to bleeding, tamponade, heart block, stroke, damage to surrounding organs, among others. The patient's history has been reviewed, patient examined, no change in status, stable for surgery.  I have reviewed the patient's chart and labs.  Questions were answered to the patient's satisfaction.    Shawn Hazzard Curt Bears, MD 10/17/2017 10:23 AM

## 2017-10-17 NOTE — Transfer of Care (Signed)
Immediate Anesthesia Transfer of Care Note  Patient: Shawn Hernandez  Procedure(s) Performed: A-FLUTTER ABLATION (N/A )  Patient Location: Cath Lab  Anesthesia Type:General  Level of Consciousness: awake, drowsy and patient cooperative  Airway & Oxygen Therapy: Patient Spontanous Breathing and Patient connected to nasal cannula oxygen  Post-op Assessment: Report given to RN and Post -op Vital signs reviewed and stable  Post vital signs: Reviewed and stable  Last Vitals:  Vitals Value Taken Time  BP 152/90 10/17/2017 12:59 PM  Temp 36.8 C 10/17/2017 12:59 PM  Pulse 91 10/17/2017 12:59 PM  Resp 18 10/17/2017 12:59 PM  SpO2 99 % 10/17/2017 12:59 PM    Last Pain:  Vitals:   10/17/17 1259  TempSrc: Oral  PainSc:       Patients Stated Pain Goal: 2 (38/87/19 5974)  Complications: No apparent anesthesia complications

## 2017-10-17 NOTE — Anesthesia Procedure Notes (Signed)
Procedure Name: Intubation Date/Time: 10/17/2017 11:10 AM Performed by: Renato Shin, CRNA Pre-anesthesia Checklist: Patient identified, Emergency Drugs available, Suction available and Patient being monitored Patient Re-evaluated:Patient Re-evaluated prior to induction Oxygen Delivery Method: Circle system utilized Preoxygenation: Pre-oxygenation with 100% oxygen Induction Type: IV induction Ventilation: Mask ventilation without difficulty Laryngoscope Size: Glidescope and 4 Grade View: Grade I Tube size: 7.5 mm Number of attempts: 1 Airway Equipment and Method: Rigid stylet Placement Confirmation: ETT inserted through vocal cords under direct vision,  positive ETCO2,  CO2 detector and breath sounds checked- equal and bilateral Secured at: 23 cm Tube secured with: Tape Dental Injury: Teeth and Oropharynx as per pre-operative assessment

## 2017-10-17 NOTE — Progress Notes (Signed)
Site area: RFV x 3 Site Prior to Removal:  Level 0 Pressure Applied For: 20 min Manual:   yes Patient Status During Pull:  stable Post Pull Site:  Level 0 Post Pull Instructions Given: yes  Post Pull Pulses Present: palpable Dressing Applied:  tegaderm and gauze Bedrest begins @ 1330 Comments:

## 2017-10-17 NOTE — Progress Notes (Signed)
Redness noted to both groin area, pt states he shaved himself and caused this redness.

## 2017-10-17 NOTE — Anesthesia Postprocedure Evaluation (Signed)
Anesthesia Post Note  Patient: Shawn Hernandez  Procedure(s) Performed: A-FLUTTER ABLATION (N/A )     Patient location during evaluation: Cath Lab Anesthesia Type: General Level of consciousness: awake and alert Pain management: pain level controlled Vital Signs Assessment: post-procedure vital signs reviewed and stable Respiratory status: spontaneous breathing, nonlabored ventilation, respiratory function stable and patient connected to nasal cannula oxygen Cardiovascular status: blood pressure returned to baseline and stable Postop Assessment: no apparent nausea or vomiting Anesthetic complications: no    Last Vitals:  Vitals:   10/17/17 1330 10/17/17 1335  BP:  130/82  Pulse: 74 74  Resp: 16 18  Temp:  (!) 35.7 C  SpO2: 95% 92%    Last Pain:  Vitals:   10/17/17 1335  TempSrc: Temporal  PainSc:                  [name]

## 2017-10-17 NOTE — Discharge Instructions (Signed)
Post procedure care instructions No driving for 4 days. No lifting over 5 lbs for 1 week. No vigorous or sexual activity for 1 week. You may return to work on 10/24/17. Keep procedure site clean & dry. If you notice increased pain, swelling, bleeding or pus, call/return!  You may shower, but no soaking baths/hot tubs/pools for 1 week.      Angiogram, Care After This sheet gives you information about how to care for yourself after your procedure. Your health care provider may also give you more specific instructions. If you have problems or questions, contact your health care provider. What can I expect after the procedure? After the procedure, it is common to have bruising and tenderness at the catheter insertion area. Follow these instructions at home: Insertion site care  Follow instructions from your health care provider about how to take care of your insertion site. Make sure you: ? Wash your hands with soap and water before you change your bandage (dressing). If soap and water are not available, use hand sanitizer. ? Change your dressing as told by your health care provider. ? Leave stitches (sutures), skin glue, or adhesive strips in place. These skin closures may need to stay in place for 2 weeks or longer. If adhesive strip edges start to loosen and curl up, you may trim the loose edges. Do not remove adhesive strips completely unless your health care provider tells you to do that.  Do not take baths, swim, or use a hot tub until your health care provider approves.  You may shower 24-48 hours after the procedure or as told by your health care provider. ? Gently wash the site with plain soap and water. ? Pat the area dry with a clean towel. ? Do not rub the site. This may cause bleeding.  Do not apply powder or lotion to the site. Keep the site clean and dry.  Check your insertion site every day for signs of infection. Check for: ? Redness, swelling, or pain. ? Fluid or  blood. ? Warmth. ? Pus or a bad smell. Activity  Rest as told by your health care provider, usually for 1-2 days.  Do not lift anything that is heavier than 10 lbs. (4.5 kg) or as told by your health care provider.  Do not drive for 24 hours if you were given a medicine to help you relax (sedative).  Do not drive or use heavy machinery while taking prescription pain medicine. General instructions  Return to your normal activities as told by your health care provider, usually in about a week. Ask your health care provider what activities are safe for you.  If the catheter site starts bleeding, lie flat and put pressure on the site. If the bleeding does not stop, get help right away. This is a medical emergency.  Drink enough fluid to keep your urine clear or pale yellow. This helps flush the contrast dye from your body.  Take over-the-counter and prescription medicines only as told by your health care provider.  Keep all follow-up visits as told by your health care provider. This is important. Contact a health care provider if:  You have a fever or chills.  You have redness, swelling, or pain around your insertion site.  You have fluid or blood coming from your insertion site.  The insertion site feels warm to the touch.  You have pus or a bad smell coming from your insertion site.  You have bruising around the insertion site.  You notice blood collecting in the tissue around the catheter site (hematoma). The hematoma may be painful to the touch. Get help right away if:  You have severe pain at the catheter insertion area.  The catheter insertion area swells very fast.  The catheter insertion area is bleeding, and the bleeding does not stop when you hold steady pressure on the area.  The area near or just beyond the catheter insertion site becomes pale, cool, tingly, or numb. These symptoms may represent a serious problem that is an emergency. Do not wait to see if the  symptoms will go away. Get medical help right away. Call your local emergency services (911 in the [place] Do not drive yourself to the hospital. Summary  After the procedure, it is common to have bruising and tenderness at the catheter insertion area.  After the procedure, it is important to rest and drink plenty of fluids.  Do not take baths, swim, or use a hot tub until your health care provider says it is okay to do so. You may shower 24-48 hours after the procedure or as told by your health care provider.  If the catheter site starts bleeding, lie flat and put pressure on the site. If the bleeding does not stop, get help right away. This is a medical emergency. This information is not intended to replace advice given to you by your health care provider. Make sure you discuss any questions you have with your health care provider. Document Released: 08/10/2004 Document Revised: 12/28/2015 Document Reviewed: 12/28/2015 Elsevier Interactive Patient Education  [employer].

## 2017-10-18 ENCOUNTER — Encounter (HOSPITAL_COMMUNITY): Payer: Self-pay | Admitting: Cardiology

## 2017-11-12 ENCOUNTER — Other Ambulatory Visit: Payer: Self-pay | Admitting: Cardiology

## 2017-11-19 ENCOUNTER — Encounter: Payer: Self-pay | Admitting: Cardiology

## 2017-11-19 ENCOUNTER — Ambulatory Visit: Payer: PPO | Admitting: Cardiology

## 2017-11-19 VITALS — BP 118/60 | HR 65 | Ht 72.0 in | Wt 243.0 lb

## 2017-11-19 DIAGNOSIS — I2510 Atherosclerotic heart disease of native coronary artery without angina pectoris: Secondary | ICD-10-CM | POA: Diagnosis not present

## 2017-11-19 DIAGNOSIS — I483 Typical atrial flutter: Secondary | ICD-10-CM

## 2017-11-19 DIAGNOSIS — E785 Hyperlipidemia, unspecified: Secondary | ICD-10-CM

## 2017-11-19 NOTE — Progress Notes (Signed)
Electrophysiology Office Note   Date:  11/19/2017   ID:  Shawn Hernandez, DOB 1950-09-21, MRN [id number]  PCP:  [name], MD  Cardiologist:  [place] Primary Electrophysiologist:  Constance Haw, MD    No chief complaint on file.    History of Present Illness: Shawn Hernandez is a 67 y.o. male who is being seen today for the evaluation of atrial flutter at the request of [name], MD. Presenting today for electrophysiology evaluation.  A history of coronary disease status post bare-metal stent x2 to the OM in 2010 with residual 50% LAD and 70% RCA, hyperlipidemia intolerant to statins, psoriatic arthritis, obesity, paroxysmal atrial flutter, diabetes.  He has had 2 episodes of palpitations in the last 4 months.  They would like his prior atrial flutter symptoms.  It started it with minimal exertion or rest.  With his elevated heart rates, he gets pain in his shoulder blade and tingling in his arm. He is s/p atrial flutter ablation 10/18/17.  Today, denies symptoms of palpitations, chest pain, shortness of breath, orthopnea, PND, lower extremity edema, claudication, dizziness, presyncope, syncope, bleeding, or neurologic sequela. The patient is tolerating medications without difficulties.  He was feeling well up until about 2 weeks ago.  He had an episode where he felt weak and dizzy.  This lasted about 3 seconds.  After that episode, he had a incredibly flushed feeling from his waist up to his head that felt like a surge of energy.  This is happened twice since his ablation.  Past Medical History:  Diagnosis Date  . Allergy to iodine   . Arthritis    discomfort in hands and arms  . Atrial flutter, paroxysmal ([place])   . Coronary artery disease   . Diabetes type 2, controlled ([place]) 10/2016  . Diverticulosis   . Hypercholesterolemia   . Hypertension   . Psoriatic arthritis ([place])   . Sinusitis    Past Surgical History:  Procedure Laterality Date  . A-FLUTTER  ABLATION N/A 10/17/2017   Procedure: A-FLUTTER ABLATION;  Surgeon: Constance Haw, MD;  Location: [facility];  Service: Cardiovascular;  Laterality: N/A;  . COLON SURGERY  2001   2 feet removed re- diverticulosis  . CORONARY STENT PLACEMENT  2011   sequential bare-metal  . INGUINAL HERNIA REPAIR    . OTHER SURGICAL HISTORY     appendectomy     Current Outpatient Medications  Medication Sig Dispense Refill  . apixaban (ELIQUIS) 5 MG TABS tablet Take 1 tablet (5 mg total) by mouth 2 (two) times daily. 60 tablet 5  . blood glucose meter kit and supplies Dispense based on patient and insurance preference. Use up to four times daily as directed. (FOR ICD-10 E11.10). 1 each 0  . ezetimibe (ZETIA) 10 MG tablet Take 1 tablet (10 mg total) by mouth daily. 90 tablet 3  . fenofibrate (TRICOR) 145 MG tablet Take 1 tablet (145 mg total) by mouth daily. 90 tablet 3  . ketoconazole (NIZORAL) 2 % cream Apply 1 application topically daily as needed for irritation.    . Magnesium 400 MG TABS Take 1 tablet by mouth every morning.    . metFORMIN (GLUCOPHAGE XR) 500 MG 24 hr tablet Take 1 tablet (500 mg total) by mouth 2 (two) times daily with a meal. 60 tablet 0  . TRULICITY 0.62 IR/4.8NI SOPN Inject 0.75 mg into the skin once a week. Friday     No current facility-administered medications for this  visit.     Allergies:   Ivp dye [iodinated diagnostic agents]; Lisinopril; Statins; and Iodine   Social History:  The patient  reports that he has never smoked. He has never used smokeless tobacco.   Family History:  The patient's family history includes Diabetes in his brother; Heart disease in his brother, father, and mother.    ROS:  Please see the history of present illness.   Otherwise, review of systems is positive for fatigue, palpitations, dizziness.   All other systems are reviewed and negative.   PHYSICAL EXAM: VS:  BP 118/60   Pulse 65   Ht 6' (1.829 m)   Wt 243 lb (110.2 kg)    BMI 32.96 kg/m  , BMI Body mass index is 32.96 kg/m. GEN: Well nourished, well developed, in no acute distress  HEENT: normal  Neck: no JVD, carotid bruits, or masses Cardiac: RRR; no murmurs, rubs, or gallops,no edema  Respiratory:  clear to auscultation bilaterally, normal work of breathing GI: soft, nontender, nondistended, + BS MS: no deformity or atrophy  Skin: warm and dry Neuro:  Strength and sensation are intact Psych: euthymic mood, full affect  EKG:  EKG is ordered today. Personal review of the ekg ordered shows sinus rhythm, nonspecific T wave abnormality  Recent Labs: 10/17/2017: BUN 9; Creatinine, Ser 1.40; Hemoglobin 15.2; Platelets 284; Potassium 4.2; Sodium 138    Lipid Panel     Component Value Date/Time   CHOL 173 10/20/2016 0226   TRIG 201 (H) 10/20/2016 0226   HDL 29 (L) 10/20/2016 0226   CHOLHDL 6.0 10/20/2016 0226   VLDL 40 10/20/2016 0226   LDLCALC 104 (H) 10/20/2016 0226   LDLDIRECT 107.6 08/10/2011 1136     Wt Readings from Last 3 Encounters:  11/19/17 243 lb (110.2 kg)  10/17/17 245 lb (111.1 kg)  09/10/17 245 lb 3.2 oz (111.2 kg)      Other studies Reviewed: Additional studies/ records that were reviewed today include: Myoview 07/23/17 Review of the above records today demonstrates:   The left ventricular ejection fraction is mildly decreased (45-54%).  Nuclear stress EF: 54%.  There was no ST segment deviation noted during stress.  This is a low risk study.  30 day monitor 08/22/17 - personally reviewed  Normal sinus rhythm  PACs with couplets and short runs of PACs.  Isolated PVCs  Atrial flutter with RVR rate as high as 150  Symptoms of fluttering correlate with atrial flutter. symptoms of chest pain/pressure do not consistently correlate with arrhythmia.  ASSESSMENT AND PLAN:  1.  Paroxysmal atrial flutter: On Eliquis. S/p ablation 10/18/17.  Has had no known further episodes of atrial flutter.  He is having episodes where  his heart rate slows down for a few seconds.  We Mayo Owczarzak get a 30-day monitor.  This patients CHA2DS2-VASc Score and unadjusted Ischemic Stroke Rate (% per year) is equal to 2.2 % stroke rate/year from a score of 2  Above score calculated as 1 point each if present [CHF, HTN, DM, Vascular=MI/PAD/Aortic Plaque, Age if 65-74, or Male] Above score calculated as 2 points each if present [Age > 75, or Stroke/TIA/TE]   2.  Coronary artery disease: On beta-blocker, aspirin, Zetia, TriCor.  No changes.  3.  Hyperlipidemia: Per primary cardiology    Current medicines are reviewed at length with the patient today.   The patient does not have concerns regarding his medicines.  The following changes were made today: Stop bisoprolol  Labs/ tests ordered today  include:  Orders Placed This Encounter  Procedures  . Cardiac event monitor  . EKG 12-Lead     Disposition:   FU with Jody Silas 3 months  Signed, Johnhenry Tippin Meredith Leeds, MD  11/19/2017 2:34 PM     [address] [place] [address] [phone] (office) [phone] (fax)

## 2017-11-19 NOTE — Patient Instructions (Signed)
Medication Instructions:  Your physician has recommended you make the following change in your medication:  1. STOP Bisoprolol  If you need a refill on your cardiac medications before your next appointment, please call your pharmacy.   Lab work: None ordered  Testing/Procedures: Your physician has recommended that you wear an event monitor. Event monitors are medical devices that record the heart's electrical activity. Doctors most often [place] these monitors to diagnose arrhythmias. Arrhythmias are problems with the speed or rhythm of the heartbeat. The monitor is a small, portable device. You can wear one while you do your normal daily activities. This is usually used to diagnose what is causing palpitations/syncope (passing out).  Follow-Up: Your physician recommends that you schedule a follow-up appointment in: 8-10 weeks with Dr. Curt Bears (after your event monitor has been completed)  Thank you for choosing [facility]!!   Shawn Curet, RN [phone]   Any Other Special Instructions Will Be Listed Below (If Applicable).  Cardiac Event Monitoring A cardiac event monitor is a small recording device that is used to detect abnormal heart rhythms (arrhythmias). The monitor is used to record your heart rhythm when you have symptoms, such as:  Fast heartbeats (palpitations), such as heart racing or fluttering.  Dizziness.  Fainting or light-headedness.  Unexplained weakness.  Some monitors are wired to electrodes placed on your chest. Electrodes are flat, sticky disks that attach to your skin. Other monitors may be hand-held or worn on the wrist. The monitor can be worn for up to 30 days. If the monitor is attached to your chest, a technician will prepare your chest for the electrode placement and show you how to work the monitor. Take time to practice using the monitor before you leave the office. Make sure you understand how to send the information from the monitor to your  health care provider. In some cases, you may need to use a landline telephone instead of a cell phone. What are the risks? Generally, this device is safe to use, but it possible that the skin under the electrodes will become irritated. How to use your cardiac event monitor  Wear your monitor at all times, except when you are in water: ? Do not let the monitor get wet. ? Take the monitor off when you bathe. Do not swim or use a hot tub with it on.  Keep your skin clean. Do not put body lotion or moisturizer on your chest.  Change the electrodes as told by your health care provider or any time they stop sticking to your skin. You may need to use medical tape to keep them on.  Try to put the electrodes in slightly different places on your chest to help prevent skin irritation. They must remain in the area under your left breast and in the upper right section of your chest.  Make sure the monitor is safely clipped to your clothing or in a location close to your body that your health care provider recommends.  Press the button to record as soon as you feel heart-related symptoms, such as: ? Dizziness. ? Weakness. ? Light-headedness. ? Palpitations. ? Thumping or pounding in your chest. ? Shortness of breath. ? Unexplained weakness.  Keep a diary of your activities, such as walking, doing chores, and taking medicine. It is very important to note what you were doing when you pushed the button to record your symptoms. This will help your health care provider determine what might be contributing to your symptoms.  Send the recorded information as recommended by your health care provider. It may take some time for your health care provider to process the results.  Change the batteries as told by your health care provider.  Keep electronic devices away from your monitor. This includes: ? Tablets. ? MP3 players. ? Cell phones.  While wearing your monitor you should avoid: ? Electric  blankets. ? Armed forces operational officer. ? Electric toothbrushes. ? Microwave ovens. ? Magnets. ? Metal detectors. Get help right away if:  You have chest pain.  You have extreme difficulty breathing or shortness of breath.  You develop a very fast heartbeat that persists.  You develop dizziness that does not go away.  You faint or constantly feel like you are about to faint. Summary  A cardiac event monitor is a small recording device that is used to help detect abnormal heart rhythms (arrhythmias).  The monitor is used to record your heart rhythm when you have heart-related symptoms.  Make sure you understand how to send the information from the monitor to your health care provider.  It is important to press the button on the monitor when you have any heart-related symptoms.  Keep a diary of your activities, such as walking, doing chores, and taking medicine. It is very important to note what you were doing when you pushed the button to record your symptoms. This will help your health care provider learn what might be causing your symptoms. This information is not intended to replace advice given to you by your health care provider. Make sure you discuss any questions you have with your health care provider. Document Released: 11/01/2007 Document Revised: 01/07/2016 Document Reviewed: 01/07/2016 Elsevier Interactive Patient Education  2017 [employer].

## 2017-11-27 ENCOUNTER — Ambulatory Visit: Payer: PPO

## 2017-11-27 DIAGNOSIS — E1165 Type 2 diabetes mellitus with hyperglycemia: Secondary | ICD-10-CM | POA: Diagnosis not present

## 2017-11-27 DIAGNOSIS — E782 Mixed hyperlipidemia: Secondary | ICD-10-CM | POA: Diagnosis not present

## 2017-11-29 ENCOUNTER — Telehealth: Payer: Self-pay | Admitting: *Deleted

## 2017-11-29 NOTE — Telephone Encounter (Signed)
Dropped off letter reporting: Pt reported flutter episodes on 11/26/17. HRs up to 124.   Pt ended up taking Bisoprolol. Event lasted 1.5 hours.  Dr. Curt Bears reviewed -- no new orders/recommendations. Pt will continue to monitor. Pt made aware I would only call him back if there were recommendations.  He was advised to continue to monitor otherwise.

## 2017-12-03 DIAGNOSIS — E1165 Type 2 diabetes mellitus with hyperglycemia: Secondary | ICD-10-CM | POA: Diagnosis not present

## 2017-12-03 DIAGNOSIS — I10 Essential (primary) hypertension: Secondary | ICD-10-CM | POA: Diagnosis not present

## 2017-12-03 DIAGNOSIS — E782 Mixed hyperlipidemia: Secondary | ICD-10-CM | POA: Diagnosis not present

## 2017-12-03 DIAGNOSIS — I2510 Atherosclerotic heart disease of native coronary artery without angina pectoris: Secondary | ICD-10-CM | POA: Diagnosis not present

## 2017-12-03 DIAGNOSIS — Z23 Encounter for immunization: Secondary | ICD-10-CM | POA: Diagnosis not present

## 2017-12-09 ENCOUNTER — Ambulatory Visit (INDEPENDENT_AMBULATORY_CARE_PROVIDER_SITE_OTHER): Payer: PPO

## 2017-12-09 DIAGNOSIS — I483 Typical atrial flutter: Secondary | ICD-10-CM

## 2017-12-26 ENCOUNTER — Other Ambulatory Visit: Payer: Self-pay | Admitting: *Deleted

## 2017-12-26 DIAGNOSIS — E1121 Type 2 diabetes mellitus with diabetic nephropathy: Secondary | ICD-10-CM

## 2017-12-26 DIAGNOSIS — I4892 Unspecified atrial flutter: Secondary | ICD-10-CM

## 2017-12-26 NOTE — Patient Outreach (Signed)
[facility]) Care Management  12/26/2017  JASSON SIEGMANN July 27, 1950 [id number]   TELEPHONE SCREENING Referral date:12/25/17 Referral source: Provider Office Referral reason: Medication Assistance - patient in coverage gap Insurance: [place]  Telephone call to patient regarding financial assistance with cost of Eliquis and Trulicity as he is in the coverage gap.              Unable to reach patient. HIPAA compliant voice message left with call back phone number.  PLAN: RNCM will attempt a second telephone outreach to patient within 4 business days.   Will also mail unsuccessful outreach letter to his home  address.  Barrington Ellison RN,CCM,CDE [place] Management Coordinator Office Phone [phone] Office Fax [phone]  .

## 2017-12-26 NOTE — Patient Outreach (Addendum)
[facility]) Care Management  12/26/2017  Shawn Hernandez 06-27-1950 [id number]   TELEPHONE SCREENING Referral date: 12/25/17 Referral source: Primary Care Provider's Office Referral reason: Financial assistance with Medications- in coverage gap Insurance: [place]   Patient returned call to this [facility] and screening completed. He is in coverage gap and states the cost for his Trulicity is $734.03 per month, he is also requesting assistance with cost of Eliquis. He states he was diagnosed with atrial flutter 1 1/2 years ago, is s/p ablation 10/18/17 but still requires medications for heart rate control. He says he is currently wearing a 30 day cardiac monitor. He says he spoke with his pharmacist at [facility] and was told he will likely not qualify for financial assistance until he meets his medication co-insurance limit. 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 He says he was also recently told his GFR is continuing to decrease over time and his primary care physician prescribed olmesartan. He says he is not sure how to check his pulse but has a blood pressure machine at home that measures pulse in addition to blood pressure.  He agrees to pharmacy consult for medication assistance and adverse side effect discussion, but denied need for RN Health Coach for diabetes self management assistance as he says his priority is getting his atrial flutter under control. He reports his most recent Hgb A1C was 7.2% on 11/27/17 with a target of <7.0%.    PLAN: Will refer to [place] Management pharmacist for medication financial assistance and adverse side effects discussion.  Barrington Ellison RN,CCM,CDE [place] Management  Coordinator Office Phone [phone] Office Fax [phone]

## 2017-12-27 ENCOUNTER — Other Ambulatory Visit: Payer: Self-pay

## 2017-12-27 NOTE — Patient Outreach (Signed)
[facility]) Care Management  [place]  12/27/2017  DANNEL RAFTER April 26, 1950 [id number]  Reason for referral: medication assistance and polypharmacy medication review  Unsuccessful telephone call attempt # 1 to patient.   HIPAA compliant voicemail left requesting a return call.  Plan:  I will make another outreach attempt to patient within 3-4 business days.  Joetta Manners, PharmD Clinical Pharmacist [place] [phone]

## 2017-12-27 NOTE — Patient Outreach (Addendum)
[facility]) Care Management  [place]   12/27/2017  Shawn Hernandez 04-13-1950 [id number]  Reason for referral: medication assistance  Referral source: [facility] CM RN Referral medication(s): Trulicity and Eliquis Current insurance: HTA  PMHx:  Atrial flutter, coronary artery disease, hypertension, diverticulosis, diabetes and hypercholesterolemia.  HPI:  [name] reports that he is in the coverage gap and is having trouble affording his medications. He states that Trulicity is costing him $188.  He states that he is currently wearing a heart monitor and will she cardiology in January.  He states that he checks his CBGs daily.    HgA1c 7.2% on 11/27/17  Allergies  Allergen Reactions  . Ivp Dye [Iodinated Diagnostic Agents] Hives  . Lisinopril Other (See Comments)    cough  . Statins Other (See Comments)    myalgias  . Iodine Rash    Medications Reviewed Today    Reviewed by Shawn Hernandez, [facility] (Pharmacist) on 12/27/17 at 1313  Med List Status: <None>  Medication Order Taking? Sig Documenting Provider Last Dose Status Informant  apixaban (ELIQUIS) 5 MG TABS tablet [id number] Yes Take 1 tablet (5 mg total) by mouth 2 (two) times daily. [place], Peter M, MD Taking Active Self  bisoprolol (ZEBETA) 5 MG tablet [id number] Yes Take 5 mg by mouth daily. [provider] Taking Active Self  blood glucose meter kit and supplies [id number] Yes Dispense based on patient and insurance preference. Use up to four times daily as directed. (FOR ICD-10 E11.10). Shawn Iba, MD Taking Active Self  ezetimibe (ZETIA) 10 MG tablet [id number] Yes Take 1 tablet (10 mg total) by mouth daily. Barrett, Evelene Croon, PA-C Taking Active Self  fenofibrate (TRICOR) 145 MG tablet [id number] Yes Take 1 tablet (145 mg total) by mouth daily. Barrett, Evelene Croon, PA-C Taking Active Self        Discontinued 12/27/17 1313 (No longer needed (for PRN medications))   Magnesium  400 MG TABS [id number] Yes Take 1 tablet by mouth every morning. [provider] Taking Active Self  metFORMIN (GLUCOPHAGE XR) 500 MG 24 hr tablet [id number] Yes Take 1 tablet (500 mg total) by mouth 2 (two) times daily with a meal. Johnson, Clanford L, MD Taking Active Self  olmesartan (BENICAR) 20 MG tablet [id number] Yes Take 20 mg by mouth daily. [provider] Taking Active Self  TRULICITY 4.65 KC/1.2XN SOPN [id number] Yes Inject 0.75 mg into the skin once a week. Shawn Hernandez [provider] Taking Active Self          Assessment:  Drugs sorted by system:  Cardiovascular: apixaban, bisoprolol, ezetimibe, fenofibrate, olmesartan  Endocrine: metformin, dulaglutide  Vitamins/Minerals/Supplements: magnesium  Medication Review Findings:  . Bisoprolol- Patient reports that he is still on this medication.  Office note from Shawn Hernandez on 10/15 says to continue beta blocker in one spot and then to discontinue bisorpolol a few lines later.  Patient reports that he has experienced an increased heart rate within 48 hours of discontinuing this medication in the past.  Will clarify with Shawn Hernandez.    Medication Assistance Findings:  Extra Help:   [] Already receiving Full Extra Help  [] Already receiving Partial Extra Help  [] Eligible based on reported income and assets  [x] Not Eligible based on reported income and assets  Patient Assistance Programs: 1) Trulicity made by [place] requirement met: [x] Yes [] No [] Unknown o Out-of-pocket prescription expenditure met:    [] Yes []  No  [x] Unknown  [] Not applicable Needs to have spent $1100.        2)  Eliquis made by [employer] o Income requirement met: [x] Yes [] No  [] Unknown o Out-of-pocket prescription expenditure met:   [] Yes [] No   [x] Unknown [] Not applicable  Needs to have spent 3% of income.   Additional medication assistance options reviewed with patient as warranted:  No other  options identified  Patient assistance programs end 02/04/18 and the processing time is longer due to demand.  Given these factors [name] elected not to apply for assistance for this year. I have given him my phone number and a dollar amount that he needs to spend  based on his income to be eligible for Eliquis ~$550 and requested that he call me once he is getting close and we will get the application in the mail.  He verbalized understanding.   Plan:  Outreach to Shawn Hernandez to determine if patient should continue bisoprolol.   Once clarified close [facility] case.   Shawn Hernandez, PharmD Clinical Pharmacist [facility] [phone]       

## 2017-12-30 ENCOUNTER — Other Ambulatory Visit: Payer: Self-pay

## 2017-12-30 ENCOUNTER — Other Ambulatory Visit: Payer: Self-pay | Admitting: *Deleted

## 2017-12-30 ENCOUNTER — Telehealth: Payer: Self-pay | Admitting: Cardiology

## 2017-12-30 ENCOUNTER — Ambulatory Visit: Payer: Self-pay

## 2017-12-30 NOTE — Patient Outreach (Signed)
[facility]) Care Management  12/30/2017  Shawn FANIEL 07/13/1950 [id number]   Returned call to patient and left voice mail advising him the purpose for my previous voice mail was to clarify concurrent use of Bisoprolol and Olmesartan since cardiology note of 10/15 reads that he was to stop bisoprolol and per patient his primary care physician started the Olmesartan to address his increasing GFR.  Left voice mail for Mr Lemelin today advising him that since [place] Management pharmacist Joetta Manners indicated in her note of 11/22 that she is clarifying this issue with his cardiologist,  there is no need for him to return a call to this RNCM.  Barrington Ellison RN,CCM,CDE [place] Management Coordinator Office Phone [phone] Office Fax [phone]

## 2017-12-30 NOTE — Telephone Encounter (Signed)
  Patient is returning call from [facility] regarding Bisoprolol

## 2017-12-30 NOTE — Telephone Encounter (Signed)
New Message  Pt c/o medication issue:  1. Name of Medication: bisoprolol (ZEBETA) 5 MG tablet  2. How are you currently taking this medication (dosage and times per day)? Take 5 mg by mouth daily  3. Are you having a reaction (difficulty breathing--STAT)? no  4. What is your medication issue? Anderson Malta from [place] wants to clarify if the pt is still taking bisoprolol. Please call

## 2017-12-30 NOTE — Telephone Encounter (Signed)
Advised that pt was instructed to stop Bisoprolol at 10/15 OV w/ Dr. Curt Bears d/t weakness/dizziness/fatigue. Anderson Malta, w/ [facility], tells that pt is still complaining of above symptoms and that he has not stopped taking the Bisoprolol yet. She and I will both attempt to reach pt to instruct him to stop the medication.

## 2017-12-30 NOTE — Telephone Encounter (Signed)
Follow up  ° ° °Patient is returning your call. °

## 2017-12-30 NOTE — Patient Outreach (Addendum)
[facility]) Care Management  12/30/2017  KASEEM VASTINE May 20, 1950 [id number]  Message left with Dr. Macky Lower office to clarify if Mr. Thoma should continue taking bisoprolol.   In-basket message received from Dr. Curt Bears.  He stated it is okay to stop bisoprolol.  Subsequent call received from Timmie Foerster at Dr. Macky Lower office who states bisoprolol was discontinued on 11/19/17 due to patient complaints of weakness, dizziness and fatigue.   Left HIPAA compliant voice message for Mr. Kamara to return my call.   Plan: Outreach to Mr. Garriga to tell him to discontinue bisoprolol.  Joetta Manners, PharmD Clinical Pharmacist [place] [phone]  Addendum: Incoming call received from Mr. Weldy.  HIPAA identifiers verified.   Informed Mr. Snellgrove that Dr. Curt Bears said to discontinue bisoprolol.  He states he is worried that if he stops, that he will have flutter within 48 to 72 hours.   Encouraged him to call Dr. Macky Lower office and speak with the nurse to convey his concerns.   He verified that bisoprolol makes him feel dizzy, weak and fatigued.  Requested that that he call me back after he speaks with cardiologist's office, so I can update his medication list if needed.  Plan: Follow up with Mr. Hicks next week.  Joetta Manners, PharmD Clinical Pharmacist [place] [phone]

## 2017-12-30 NOTE — Patient Outreach (Signed)
[facility]) Care Management  12/30/2017  Shawn Hernandez 1950/10/15 [id number]   [name] returned call and said he was aware Dr. Curt Bears wanted him to stop the bisoprolol when he saw him on 10/15 but he elected to continue to take the medication as he said the last time he stopped it he had an episode of tachycardia with near syncope.  He says he is continuing to experience worsening dizziness and wonders if it is related to his medications. Advised him Joetta Manners, [place] Management pharmacist, is following up with his cardiologist.  Barrington Ellison RN,CCM,CDE [place] Management Coordinator Office Phone [phone] Office Fax [phone]

## 2017-12-31 ENCOUNTER — Ambulatory Visit: Payer: PPO

## 2017-12-31 ENCOUNTER — Ambulatory Visit: Payer: Self-pay

## 2017-12-31 NOTE — Telephone Encounter (Signed)
Left detailed message informing pt that I would call him next week.  Explained that he should stop the Bisoprolol and I would call next week to speak verbally.

## 2018-01-01 ENCOUNTER — Ambulatory Visit: Payer: Self-pay | Admitting: *Deleted

## 2018-01-06 NOTE — Telephone Encounter (Signed)
Pt confirms that he has stopped the Bisoprolol. He would like to know how to handle breakthrough AFLutter episodes like what he experienced 1 1/2 weeks ago. Pt aware I will discuss w/ Camnitz and let him know advisement. Pt agreeable.

## 2018-01-07 ENCOUNTER — Ambulatory Visit: Payer: PPO

## 2018-01-09 ENCOUNTER — Ambulatory Visit: Payer: Self-pay

## 2018-01-09 ENCOUNTER — Other Ambulatory Visit: Payer: Self-pay

## 2018-01-09 NOTE — Patient Outreach (Addendum)
[place] [facility]) Care Management  [place]  01/09/2018  SAGAN MASELLI 1950-07-21 [id number]  Unsuccessful telephone call attempt # 1 to Mr. Rodarte to see how he is feeling after discontinuing bisoprolol.  He was concerned he would have atrial flutter if he stopped this medicaiton. HIPAA compliant voicemail left requesting a return call  Plan:  I will make another outreach attempt to patient within 3-4 business days  Joetta Manners, Portal [phone]  Addendum:  Incoming call received from Mr. Billey.  HIPAA identifiers verified.  Mr. Melroy reports that he has had four episodes of increased HR (> 100 bpm) and decreased BP (<125/55-60) since stopping bisoprolol.  Each event lasts about 1.5 hours and he states he gets hot and sweaty.  Plan: Outreach to cardiology Friday morning and see if Dr. Curt Bears has a recommendation.   Joetta Manners, PharmD Clinical Pharmacist [place] [phone]

## 2018-01-09 NOTE — Telephone Encounter (Signed)
Left detailed message informing pt ok for him to take Bisoprolol once daily PRN for breakthrough AFlutter. Informed that I would follow up w/ him next week to discuss other recommendation of Dr. Curt Bears.  (if continues having episodes will need a monitor to confirm actual abnormality and check to see if rhythm has progressed into AFIb)

## 2018-01-10 ENCOUNTER — Other Ambulatory Visit: Payer: Self-pay

## 2018-01-10 NOTE — Patient Outreach (Signed)
[name] Care Management  01/10/2018  Shawn Hernandez February 22, 1950 [id number]  Incoming call received from [name].  HIPAA identifiers verified.   [name] states he spoke with Sherri at Dr. Macky Lower office yesterday afternoon and understands that he should take a prn bisoprolol if A Flutter occurs.  He states he is worried about having scheduled plans and then having an episode occur because it takes at least 1.5 hours to resolve. He is aware that she will outreach to him next will to discuss Dr. Macky Lower recommendations.   Plan: Follow up with [name] next week.  Joetta Manners, PharmD Clinical Pharmacist [place] [phone]

## 2018-01-14 DIAGNOSIS — I2510 Atherosclerotic heart disease of native coronary artery without angina pectoris: Secondary | ICD-10-CM | POA: Diagnosis not present

## 2018-01-14 DIAGNOSIS — I10 Essential (primary) hypertension: Secondary | ICD-10-CM | POA: Diagnosis not present

## 2018-01-14 DIAGNOSIS — E1165 Type 2 diabetes mellitus with hyperglycemia: Secondary | ICD-10-CM | POA: Diagnosis not present

## 2018-01-14 NOTE — Telephone Encounter (Signed)
Pt reports that things have "calmed down".  Last episode was last Thursday while bowling. Pt is going to continue to monitor and take Bisoprolol PRN.  He is agreeable to calling office if issue begins to become more frequent and/or SE begin. He understands we may need to do another monitor to ensure "episodes" are not afib.

## 2018-01-15 ENCOUNTER — Ambulatory Visit: Payer: Self-pay

## 2018-01-15 ENCOUNTER — Other Ambulatory Visit: Payer: Self-pay

## 2018-01-15 NOTE — Patient Outreach (Addendum)
[facility]) Care Management  [place]  01/15/2018  MIKAEL SKODA 1950-09-02 [id number]  Unsuccessful outreach attempt to Mr. Youtz.  Unsuccessful telephone call attempt # 1 to patient.   HIPAA compliant voicemail left requesting a return call.  Plan:  I will make another outreach attempt to patient within 3-4 business days.  Joetta Manners, PharmD Clinical Pharmacist [place] [phone]  Addendum: Successful incoming call from Mr. Demetriou.  HIPAA identifiers verified.   Mr. Gover reports that he has not had anymore episodes of A-flutter since last week.  He states that he has spoken with Sherri at Dr. Macky Lower office.  He reports that he is aware that he should call their office if he has more events and that he should take bisoprolol prn A-flutter.  Patient also reports that he has an inner ear condition that he attributes to some of his dizzy spells.  I encouraged him to discuss this with Dr. Curt Bears at his next visit.   Mr. Gnau states that he has no further medication questions or concerns.  Informed him that I will close his [facility] case at this time.  He has my phone number and is aware that he can call me in the future if medication issues arise.  Plan: Route case closure letter to PCP, Dr. Ashby Dawes.   Joetta Manners, PharmD Clinical Pharmacist [place] [phone]

## 2018-01-17 ENCOUNTER — Ambulatory Visit: Payer: PPO

## 2018-01-21 DIAGNOSIS — E1165 Type 2 diabetes mellitus with hyperglycemia: Secondary | ICD-10-CM | POA: Diagnosis not present

## 2018-01-21 DIAGNOSIS — I129 Hypertensive chronic kidney disease with stage 1 through stage 4 chronic kidney disease, or unspecified chronic kidney disease: Secondary | ICD-10-CM | POA: Diagnosis not present

## 2018-01-21 DIAGNOSIS — E782 Mixed hyperlipidemia: Secondary | ICD-10-CM | POA: Diagnosis not present

## 2018-01-21 DIAGNOSIS — N183 Chronic kidney disease, stage 3 (moderate): Secondary | ICD-10-CM | POA: Diagnosis not present

## 2018-01-21 DIAGNOSIS — I2510 Atherosclerotic heart disease of native coronary artery without angina pectoris: Secondary | ICD-10-CM | POA: Diagnosis not present

## 2018-02-17 ENCOUNTER — Ambulatory Visit (INDEPENDENT_AMBULATORY_CARE_PROVIDER_SITE_OTHER): Payer: PPO | Admitting: Cardiology

## 2018-02-17 ENCOUNTER — Encounter: Payer: Self-pay | Admitting: Cardiology

## 2018-02-17 ENCOUNTER — Encounter (INDEPENDENT_AMBULATORY_CARE_PROVIDER_SITE_OTHER): Payer: Self-pay

## 2018-02-17 VITALS — BP 124/70 | HR 83 | Ht 72.0 in | Wt 246.0 lb

## 2018-02-17 DIAGNOSIS — I483 Typical atrial flutter: Secondary | ICD-10-CM

## 2018-02-17 DIAGNOSIS — G720 Drug-induced myopathy: Secondary | ICD-10-CM

## 2018-02-17 DIAGNOSIS — I480 Paroxysmal atrial fibrillation: Secondary | ICD-10-CM | POA: Diagnosis not present

## 2018-02-17 DIAGNOSIS — T466X5A Adverse effect of antihyperlipidemic and antiarteriosclerotic drugs, initial encounter: Secondary | ICD-10-CM

## 2018-02-17 MED ORDER — DRONEDARONE HCL 400 MG PO TABS: 400.0000 mg | ORAL_TABLET | Freq: Two times a day (BID) | ORAL | 3 refills | Status: DC

## 2018-02-17 NOTE — Progress Notes (Signed)
Electrophysiology Office Note   Date:  02/17/2018   ID:  Shawn Hernandez, DOB [birth year], MRN [id number]  PCP:  [name], MD  Cardiologist:  [place] Primary Electrophysiologist:  Dr Curt Bears    CC: Follow up for atrial fibrillation/flutter   History of Present Illness: Shawn Hernandez is a 68 y.o. male who is being seen today for the evaluation of atrial flutter at the request of [name], MD. Presenting today for electrophysiology evaluation.  A history of coronary disease status post bare-metal stent x2 to the OM in 2010 with residual 50% LAD and 70% RCA, hyperlipidemia intolerant to statins, psoriatic arthritis, obesity, paroxysmal atrial flutter, diabetes.  He has had 2 episodes of palpitations in the last 4 months.  They would like his prior atrial flutter symptoms.  It started it with minimal exertion or rest.  With his elevated heart rates, he gets pain in his shoulder blade and tingling in his arm. He is s/p atrial flutter ablation 10/18/17.  He recently wore a monitor which did confirm paroxysmal atrial fibrillation, overall burden was low. Patient is symptomatic with exercise intolerance and palpitations.   Today, denies symptoms of chest pain, shortness of breath, orthopnea, PND, lower extremity edema, claudication, dizziness, presyncope, syncope, bleeding, or neurologic sequela. The patient is tolerating medications without difficulties.    Past Medical History:  Diagnosis Date  . Allergy to iodine   . Arthritis    discomfort in hands and arms  . Atrial flutter, paroxysmal ([place])   . Coronary artery disease   . Diabetes type 2, controlled ([place]) 10/2016  . Diverticulosis   . Hypercholesterolemia   . Hypertension   . Psoriatic arthritis ([place])   . Sinusitis    Past Surgical History:  Procedure Laterality Date  . A-FLUTTER ABLATION N/A 10/17/2017   Procedure: A-FLUTTER ABLATION;  Surgeon: Constance Haw, MD;  Location: [place] [facility];   Service: Cardiovascular;  Laterality: N/A;  . COLON SURGERY  2001   2 feet removed re- diverticulosis  . CORONARY STENT PLACEMENT  2011   sequential bare-metal  . INGUINAL HERNIA REPAIR    . OTHER SURGICAL HISTORY     appendectomy     Current Outpatient Medications  Medication Sig Dispense Refill  . apixaban (ELIQUIS) 5 MG TABS tablet Take 1 tablet (5 mg total) by mouth 2 (two) times daily. 60 tablet 5  . blood glucose meter kit and supplies Dispense based on patient and insurance preference. Use up to four times daily as directed. (FOR ICD-10 E11.10). 1 each 0  . ezetimibe (ZETIA) 10 MG tablet Take 1 tablet (10 mg total) by mouth daily. 90 tablet 3  . fenofibrate (TRICOR) 145 MG tablet Take 1 tablet (145 mg total) by mouth daily. 90 tablet 3  . Magnesium 400 MG TABS Take 1 tablet by mouth every morning.    . metFORMIN (GLUCOPHAGE XR) 500 MG 24 hr tablet Take 1 tablet (500 mg total) by mouth 2 (two) times daily with a meal. 60 tablet 0  . olmesartan (BENICAR) 20 MG tablet Take 20 mg by mouth daily.    . TRULICITY 4.09 WJ/1.9JY SOPN Inject 0.75 mg into the skin once a week. Friday    . dronedarone (MULTAQ) 400 MG tablet Take 1 tablet (400 mg total) by mouth 2 (two) times daily with a meal. 60 tablet 3   No current facility-administered medications for this visit.     Allergies:   Ivp dye [iodinated diagnostic agents]; Lisinopril;  Statins; and Iodine   Social History:  The patient  reports that he has never smoked. He has never used smokeless tobacco.   Family History:  The patient's family history includes Diabetes in his brother; Heart disease in his brother, father, and mother.    ROS:  Please see the history of present illness.   Otherwise, review of systems is positive for palpitations.   All other systems are reviewed and negative.   PHYSICAL EXAM: VS:  BP 124/70   Pulse 83   Ht 6' (1.829 m)   Wt 246 lb (111.6 kg)   BMI 33.36 kg/m  , BMI Body mass index is 33.36  kg/m. GEN: Well nourished, well developed, in no acute distress  HEENT: normal  Neck: no JVD, carotid bruits, or masses Cardiac: RRR; no murmurs, rubs, or gallops,no edema  Respiratory:  clear to auscultation bilaterally, normal work of breathing GI: soft, nontender, nondistended, + BS MS: no deformity or atrophy  Skin: warm and dry Neuro:  Strength and sensation are intact Psych: euthymic mood, full affect  EKG:  EKG is ordered today. Personal review of the ekg ordered shows SR HR 83, PR 182, QRS 82, QTc 423   Recent Labs: 10/17/2017: BUN 9; Creatinine, Ser 1.40; Hemoglobin 15.2; Platelets 284; Potassium 4.2; Sodium 138    Lipid Panel     Component Value Date/Time   CHOL 173 10/20/2016 0226   TRIG 201 (H) 10/20/2016 0226   HDL 29 (L) 10/20/2016 0226   CHOLHDL 6.0 10/20/2016 0226   VLDL 40 10/20/2016 0226   LDLCALC 104 (H) 10/20/2016 0226   LDLDIRECT 107.6 08/10/2011 1136     Wt Readings from Last 3 Encounters:  02/17/18 246 lb (111.6 kg)  11/19/17 243 lb (110.2 kg)  10/17/17 245 lb (111.1 kg)      Other studies Reviewed: Additional studies/ records that were reviewed today include: Myoview 07/23/17 Review of the above records today demonstrates:   The left ventricular ejection fraction is mildly decreased (45-54%).  Nuclear stress EF: 54%.  There was no ST segment deviation noted during stress.  This is a low risk study.  30 day monitor 01/17/18 - personally reviewed Sinus rhythm PACs associated with palpitations Less than 1% atrial fibrillation, fastest heart rate 136 bpm Less than 1% PVCs Less than 1% PACs  ASSESSMENT AND PLAN:  1.  Paroxysmal atrial fibrillation/flutter: On Eliquis. S/p atrial flutter ablation 10/18/17.  Has had no known further episodes of atrial flutter.   30-day monitor showed paroxysmal atrial fibrillation. Patient is symptomatic with palpitations and exercise intolerance. Class Ic AAD not a good option given CAD.  Shawn Hernandez start  Multaq 400 mg BID Continue Eliquis 5 mg BID  This patients CHA2DS2-VASc Score and unadjusted Ischemic Stroke Rate (% per year) is equal to 2.2 % stroke rate/year from a score of 2  Above score calculated as 1 point each if present [CHF, HTN, DM, Vascular=MI/PAD/Aortic Plaque, Age if 65-74, or Male] Above score calculated as 2 points each if present [Age > 75, or Stroke/TIA/TE]  2.  Coronary artery disease: On beta-blocker, aspirin, statin. No anginal symptoms.   3.  Hyperlipidemia: Per primary cardiology    Current medicines are reviewed at length with the patient today.   The patient does not have concerns regarding his medicines.  The following changes were made today: start Multaq  Labs/ tests ordered today include:  Orders Placed This Encounter  Procedures  . EKG 12-Lead     Disposition:  FU with Shawn Hernandez 3 months  Shawn Hernandez, [place]  02/17/2018 12:25 PM     [facility] [address] [place] [address] [phone] (office) [phone] (fax)  I have seen and examined this patient with Adline Peals.  Agree with above, note added to reflect my findings.  On exam, RRR, no murmurs, lungs clear.  Status post atrial flutter ablation with repeat palpitations.  Unfortunately he has had episodes of atrial fibrillation.  He is symptomatic from this.  We Cathryne Mancebo thus start [place].  We Ra Pfiester continue his anticoagulation.  Brasen Bundren M. Rufus Beske MD 02/17/2018 2:31 PM

## 2018-02-17 NOTE — Patient Instructions (Addendum)
Medication Instructions:  Your physician has recommended you make the following change in your medication:  1. START Multaq 400 mg twice a day  * If you need a refill on your cardiac medications before your next appointment, please call your pharmacy.   Labwork: None ordered  Testing/Procedures: None ordered  Follow-Up: Your physician recommends that you schedule a follow-up appointment in: 3 months with Adline Peals, PA in the AFib clinic.    Thank you for choosing [facility]!!   Trinidad Curet, RN [phone]  Any Other Special Instructions Will Be Listed Below (If Applicable).

## 2018-02-18 ENCOUNTER — Other Ambulatory Visit: Payer: Self-pay | Admitting: Pharmacy Technician

## 2018-02-18 ENCOUNTER — Other Ambulatory Visit: Payer: Self-pay

## 2018-02-18 NOTE — Patient Outreach (Signed)
[facility]) Care Management  02/18/2018  KADARIUS CUFFE 05/23/1950 [id number]                                                  Medication Assistance Referral  Referral From: [facility] RPh Clearnce Sorrel  Medication/Company: Danelle Berry / Ralph [employer] Patient application portion:  Mailed Provider application portion: Faxed  to [employer]   Follow up:  Will follow up with patient in 5-7 business days to confirm application(s) have been received.  [name] [name] [place] Certified Pharmacy Technician [place] Management Direct Dial:[phone]

## 2018-02-18 NOTE — Patient Outreach (Signed)
[facility]) Care Management  [facility]   02/18/2018  Shawn Hernandez 06/28/1950 [id number]  Reason for referral: Medication Assistance with Trulicity, Multaq and Eliquis  Current insurance:[employer]  PMHx includes but not limited to:  atrial fibrillation, coronary artery disease, hypertension, diabetes and hypercholesterolemia   Outreach:  Incoming telephone call received from Shawn Hernandez.  HIPAA identifiers verified.   Subjective:  Shawn Hernandez reports that he was recently diagnosed with atrial fibrillation and was started on Multaq.  He had been on Eliquis for atrial flutter and Trulicity for diabetes.  He states he is worried about he costs of these medications.    Objective: Lab Results  Component Value Date   CREATININE 1.40 (H) 10/17/2017   CREATININE 1.00 10/21/2016   CREATININE 1.25 (H) 10/20/2016    Lab Results  Component Value Date   HGBA1C 13.6 (H) 10/19/2016    Lipid Panel     Component Value Date/Time   CHOL 173 10/20/2016 0226   TRIG 201 (H) 10/20/2016 0226   HDL 29 (L) 10/20/2016 0226   CHOLHDL 6.0 10/20/2016 0226   VLDL 40 10/20/2016 0226   LDLCALC 104 (H) 10/20/2016 0226   LDLDIRECT 107.6 08/10/2011 1136    BP Readings from Last 3 Encounters:  02/17/18 124/70  11/19/17 118/60  10/17/17 129/71    Allergies  Allergen Reactions  . Ivp Dye [Iodinated Diagnostic Agents] Hives  . Lisinopril Other (See Comments)    cough  . Statins Other (See Comments)    myalgias  . Iodine Rash    Medications Reviewed Today    Reviewed by Mendenhall, Jennifer D, RPH (Pharmacist) on 02/18/18 at 1112  Med List Status: <None>  Medication Order Taking? Sig Documenting Provider Last Dose Status Informant  apixaban (ELIQUIS) 5 MG TABS tablet [id number] Yes Take 1 tablet (5 mg total) by mouth 2 (two) times daily. Jordan, Peter M, MD Taking Active Self  blood glucose meter kit and supplies [id number] Yes Dispense based on  patient and insurance preference. Use up to four times daily as directed. (FOR ICD-10 E11.10). Johnson, Clanford L, MD Taking Active Self  dronedarone (MULTAQ) 400 MG tablet [id number] Yes Take 1 tablet (400 mg total) by mouth 2 (two) times daily with a meal. Camnitz, Will Martin, MD Taking Active   ezetimibe (ZETIA) 10 MG tablet [id number] Yes Take 1 tablet (10 mg total) by mouth daily. Barrett, Rhonda G, PA-C Taking Active Self  fenofibrate (TRICOR) 145 MG tablet [id number] Yes Take 1 tablet (145 mg total) by mouth daily. Barrett, Rhonda G, PA-C Taking Active Self  Magnesium 400 MG TABS [id number] Yes Take 1 tablet by mouth every morning. [provider] Taking Active Self  metFORMIN (GLUCOPHAGE XR) 500 MG 24 hr tablet [id number] Yes Take 1 tablet (500 mg total) by mouth 2 (two) times daily with a meal. Johnson, Clanford L, MD Taking Active Self  olmesartan (BENICAR) 20 MG tablet [id number] Yes Take 20 mg by mouth daily. [provider] Taking Active Self  TRULICITY 0.75 MG/0.5ML SOPN [id number] Yes Inject 0.75 mg into the skin once a week. Friday [provider] Taking Active Self          Assessment:  Drugs sorted by system:  Neurologic/Psychologic:  Cardiovascular: apixaban, dronedarone, ezetimibe, fenofibrate, olmesatan  Endocrine: Trulicity  Vitamins/Minerals/Supplements: magnesium  Medication Assistance Findings:  Medication assistance needs identified.   Extra Help:   [] Already receiving Full Extra Help  [] Already receiving Partial Extra Help  []   Eligible based on reported income and assets  [x] Not Eligible based on reported income and assets  Patient Assistance Programs: 1) Trulicity made by [place] requirement met: [x] Yes [] No [] Unknown o Out-of-pocket prescription expenditure met:    [] Yes [] No  [] Unknown  [x] Not applicable        2)  Multaq made by [employer] o Income requirement met: [x] Yes [] No  [] Unknown o Out-of-pocket  prescription expenditure met:   [] Yes [x] No   [] Unknown [] Not applicable  Patient has not met application requirements to apply for this patient assistance program at this time.   He will need to spend ~$421  out of pocket on prescription  medications to be eligible.   2) Eliquis made by [place] requirement met: [x] Yes [] No [] Unknown o Out-of-pocket prescription expenditure met:    [] Yes [x] No  [] Unknown  [] Not applicable   Patient has not met application requirements to apply for this patient assistance program at this time.    He will need to spend ~$632 out of pocket on prescription medications to be eligible.   Plan: I will route patient assistance letter to [place] technician who will coordinate patient assistance program application process for medications listed above.  [facility] pharmacy technician will assist with obtaining all required documents from both patient and provider(s) and submit application(s) once completed.   Joetta Manners, PharmD Clinical Pharmacist [place] [phone]

## 2018-02-21 ENCOUNTER — Telehealth: Payer: Self-pay | Admitting: Cardiology

## 2018-02-21 NOTE — Telephone Encounter (Signed)
Patient wanted to find out if multaq prescription was sent to pharmacy. Confirmed [facility].  States he was reading about Multaq on WebMD and it told him not to take it for atrial fibrillation.  Adv pt I was unaware why that would be and confirmed ([employer]romedex) that it is used for atrial fibrillation in NSR with history of paroxysmal atrial fibrillation or persistent afib Offered to have reviewed by our PharmD to try to help him understand that it is appropriate to take.  The patient said, its ok, thank you, and ended the call.  Will route to Dr. Macky Lower nurse to be aware.

## 2018-02-21 NOTE — Telephone Encounter (Signed)
New Message:    Patient has some concerns about a new medication that he stated on monday. Patient would like for some to call him back.

## 2018-02-25 NOTE — Telephone Encounter (Signed)
Followed up w/ pt who reports starting the medication. States he is doing better on the medication w/ little/no flutters/palpitations. Pt concern now is cost of the Multaq.  Reports it used to be $90/90 but cost has gone up to &141/90 days, and now he is in the doughnut hole. Informed that I would forward this to our prior auth/tier exception department to see if there are any options for pt (such as tier exception/pt assistance). Pt aware Jeani Hawking from our office will contact him about this. He is aware I will see about getting him some samples while we look into this further. Pt agreeable to plan and appreciative of our help.

## 2018-02-26 ENCOUNTER — Other Ambulatory Visit: Payer: Self-pay | Admitting: Pharmacist

## 2018-02-26 ENCOUNTER — Other Ambulatory Visit: Payer: Self-pay

## 2018-02-26 ENCOUNTER — Other Ambulatory Visit: Payer: Self-pay | Admitting: Pharmacy Technician

## 2018-02-26 ENCOUNTER — Telehealth: Payer: Self-pay | Admitting: *Deleted

## 2018-02-26 MED ORDER — APIXABAN 5 MG PO TABS: 5.0000 mg | ORAL_TABLET | Freq: Two times a day (BID) | ORAL | 2 refills | Status: DC

## 2018-02-26 NOTE — Patient Outreach (Signed)
[facility]) Care Management  02/26/2018  ARTEM BUNTE 1950/02/21 [id number]                                                   Medication Assistance Referral  Referral From: [facility] RPh Clearnce Sorrel  Medication/Company: Robin Searing / Sanofi Patient application portion:  Mailed Provider application portion: Faxed  to Dr. Curt Bears  Medication/Company: Eliquis / [employer] Patient application portion:  Mailed Provider application portion: Faxed  to Dr. Curt Bears   Follow up:  Will follow up with patient in 5-7 business days to confirm application(s) have been received.  [name] [name] [place] Certified Pharmacy Technician [place] Management Direct [phone]

## 2018-02-26 NOTE — Patient Outreach (Signed)
[facility]) Care Management  02/26/2018  Shawn Hernandez [birth year] [id number]  Successful outreach call to Mr. Bean.   HIPAA identifiers verified.   Medication Assistance: [name] reports that he is now in the coverage gap after a 90 day fill of Trulicity and Multaq.  Confirmed with [employer] representative that he hit the coverage gap on 02/17/18.  He is currenlty using samples of Eliquis.  Per financial discussion he does not qualify for Extra Help LIS.   [place] is already working with [name] on Truliicty patient assistance.  Will send him the Sanofi assistance form for Multaq.  He will need to spend ~ $143 dollars to meet the 2% out of pocket expenditure requirement for Sanofi before he is eligible.    Will also send him the Eliquis assistance from for [employer]. They have a 3% out of pocket requirement for eligibility.  Will get all the paperwork ready and submit for when he reaches that threshold.   Plan: Route letter to [place], Etter Sjogren.  Joetta Manners, PharmD Clinical Pharmacist [place] [phone]

## 2018-02-26 NOTE — Telephone Encounter (Signed)
refill 

## 2018-02-28 NOTE — Telephone Encounter (Signed)
Please advise the pt to contact his insurance company as the increase in cost for his Multaq maybe due to a deductible that has not been met and to ask which antiarrhythmic medication they do prefer. Thanks.

## 2018-02-28 NOTE — Telephone Encounter (Signed)
Samples left at front desk. Pt notified and appreciative

## 2018-03-03 NOTE — Telephone Encounter (Signed)
Pt reports that [facility] network is currently working on this for him.  He confirmed that he is in the doughnut hole. Pt will call back if they have issues getting him assistance/reduced cost.

## 2018-03-04 ENCOUNTER — Telehealth: Payer: Self-pay

## 2018-03-04 NOTE — Telephone Encounter (Signed)
We received a [employer]ofi IT consultant) and [employer] (Eliquis) pt asst applications via fax from [employer], [place] with [facility]. I have completed both applications and placed them in Dr [employer] mail bin awaiting his signature. Once Dr Curt Bears signs the applications we will fax back to [place] at [phone] [facility]).

## 2018-03-11 ENCOUNTER — Other Ambulatory Visit: Payer: Self-pay | Admitting: Pharmacy Technician

## 2018-03-11 NOTE — Patient Outreach (Signed)
[facility]) Care Management  03/11/2018  CALEB DECOCK 10/13/1950 [id number]   Received provider portion(s) of patient assistance application for Trulicity. Faxed completed application and required documents into [employer].  Will follow up with company in 7-10 business days to check status of application.  [name] [name] [place] Certified Pharmacy Technician [place] Management Direct Dial:[phone]

## 2018-03-12 NOTE — Telephone Encounter (Signed)
**Note De-Identified Glorene Leitzke Obfuscation** Dr Curt Bears has signed the Sanofi and the BMS pt asst applications and I have faxed them both back to Etter Sjogren, CPhT at [facility].

## 2018-03-13 ENCOUNTER — Other Ambulatory Visit: Payer: Self-pay | Admitting: Pharmacy Technician

## 2018-03-13 NOTE — Patient Outreach (Signed)
[place] [employer] [facility]) Care Management  03/13/2018  Shawn Hernandez Jul 04, 1950 [id number]   Received provider portion(s) of patient assistance application for Multaq. Faxed completed application and required documents into [employer].  Will follow up with company in 2-3 business days to check status of application.  Received provider portion(s) of patient assistance application for Eliquis. Faxed completed application and required documents into [employer].  Will follow up with company in 7-10 business days to check status of application.  [name] [name] [name] Certified Pharmacy Technician [place] Management Direct Dial:[phone]

## 2018-03-17 ENCOUNTER — Other Ambulatory Visit: Payer: Self-pay | Admitting: Pharmacy Technician

## 2018-03-17 NOTE — Patient Outreach (Signed)
[facility]) Care Management  03/17/2018  [name] [birth year] [id number]    Follow up call placed to [facility] regarding patient assistance application(s) for Multaq , Coretta confirms that application has been received. She states that app is still being processed and suggests that I follow up in 2-3 business days.  Will follow up with company in 2-3 business days to check app status.  Shawn Hernandez Shawn Hernandez Shawn Hernandez Certified Pharmacy Technician Shawn Hernandez Management Direct [phone]

## 2018-03-20 ENCOUNTER — Other Ambulatory Visit: Payer: Self-pay | Admitting: Pharmacy Technician

## 2018-03-20 NOTE — Patient Outreach (Signed)
[facility]) Care Management  03/20/2018  DUWAYNE MATTERS [birth year] [id number]    Follow up call placed to [facility] regarding patient assistance application(s) for Trulicity , Barnett Applebaum confirms patient has been approved as of 2/7 until 02/05/2019. 5 boxes to arrive at providers office in 7-10 business days.   Will follow up with patient application for Multaq and Eliquis in 2-3 business days.  [name] [name] [place] Certified Pharmacy Technician [place] Management Direct Dial:[phone]

## 2018-03-25 NOTE — Telephone Encounter (Signed)
We received a letter from [facility] stating that they have denied pt asst to the pt for his Eliquis. Reason: Product covered by insurance.  I will also forward this note to Etter Sjogren, CPhT and send letter to be scanned into the pts chart.

## 2018-03-25 NOTE — Telephone Encounter (Addendum)
**Note De-Identified Tokiko Diefenderfer Obfuscation** We received a letter from [facility] stating that they have denied pt asst to the pt for his Multaq. Reason: Robin Searing is covered by the pts insurance provider.  Will forward this message to Etter Sjogren, CPhT as Juluis Rainier and send letter to be scanned into the pts chart.

## 2018-03-26 ENCOUNTER — Other Ambulatory Visit: Payer: Self-pay | Admitting: Pharmacy Technician

## 2018-03-26 DIAGNOSIS — Z0000 Encounter for general adult medical examination without abnormal findings: Secondary | ICD-10-CM | POA: Diagnosis not present

## 2018-03-26 DIAGNOSIS — Z23 Encounter for immunization: Secondary | ICD-10-CM | POA: Diagnosis not present

## 2018-03-26 DIAGNOSIS — E782 Mixed hyperlipidemia: Secondary | ICD-10-CM | POA: Diagnosis not present

## 2018-03-26 DIAGNOSIS — N183 Chronic kidney disease, stage 3 (moderate): Secondary | ICD-10-CM | POA: Diagnosis not present

## 2018-03-26 DIAGNOSIS — E1165 Type 2 diabetes mellitus with hyperglycemia: Secondary | ICD-10-CM | POA: Diagnosis not present

## 2018-03-26 DIAGNOSIS — I129 Hypertensive chronic kidney disease with stage 1 through stage 4 chronic kidney disease, or unspecified chronic kidney disease: Secondary | ICD-10-CM | POA: Diagnosis not present

## 2018-03-26 DIAGNOSIS — I2510 Atherosclerotic heart disease of native coronary artery without angina pectoris: Secondary | ICD-10-CM | POA: Diagnosis not present

## 2018-03-26 NOTE — Patient Outreach (Signed)
[facility]) Care Management  03/26/2018  Shawn Hernandez [birth year] [id number]    Follow up call placed to [place] regarding patient assistance Multaq, Lorna Few confirms patient has been approved as of 2/17 until 02/05/19. Medication should arrive at providers office in the next 7-10 business days.   Follow up call placed to [place] regarding patient assistance application(s) for Eliquis , Representative states that when soft credit check was ran on patient his income came back higher than submitted. They are requesting patient submit tax return documents as proof of income.   Successful call to patient, HIPAA identifiers verified, informed Shawn Hernandez of above information. Informed him that I will mail out a return envelope so that he can send documents back in. He stated that would be ok.  Will follow up with patient in 5-7 business days to confirm envelope has been received.  [name] [name] [place] Certified Pharmacy Technician [place] Management Direct Dial:[phone]

## 2018-03-28 ENCOUNTER — Encounter: Payer: Self-pay | Admitting: Pharmacy Technician

## 2018-04-02 ENCOUNTER — Telehealth: Payer: Self-pay | Admitting: *Deleted

## 2018-04-02 NOTE — Telephone Encounter (Signed)
Informed pt that 40mo supply of Multaq arrived from [facility]. Informed that I would leave it at front desk and he could pick up at his convenience. Asked to call office if he had any questions.

## 2018-04-08 DIAGNOSIS — I700 Atherosclerosis of aorta: Secondary | ICD-10-CM | POA: Diagnosis not present

## 2018-04-08 DIAGNOSIS — I129 Hypertensive chronic kidney disease with stage 1 through stage 4 chronic kidney disease, or unspecified chronic kidney disease: Secondary | ICD-10-CM | POA: Diagnosis not present

## 2018-04-08 DIAGNOSIS — E1169 Type 2 diabetes mellitus with other specified complication: Secondary | ICD-10-CM | POA: Diagnosis not present

## 2018-04-08 DIAGNOSIS — I4892 Unspecified atrial flutter: Secondary | ICD-10-CM | POA: Diagnosis not present

## 2018-04-08 DIAGNOSIS — I2510 Atherosclerotic heart disease of native coronary artery without angina pectoris: Secondary | ICD-10-CM | POA: Diagnosis not present

## 2018-04-08 DIAGNOSIS — L409 Psoriasis, unspecified: Secondary | ICD-10-CM | POA: Diagnosis not present

## 2018-04-08 DIAGNOSIS — E782 Mixed hyperlipidemia: Secondary | ICD-10-CM | POA: Diagnosis not present

## 2018-04-08 DIAGNOSIS — N183 Chronic kidney disease, stage 3 (moderate): Secondary | ICD-10-CM | POA: Diagnosis not present

## 2018-04-08 DIAGNOSIS — L4050 Arthropathic psoriasis, unspecified: Secondary | ICD-10-CM | POA: Diagnosis not present

## 2018-04-08 DIAGNOSIS — Z0000 Encounter for general adult medical examination without abnormal findings: Secondary | ICD-10-CM | POA: Diagnosis not present

## 2018-04-08 DIAGNOSIS — E1165 Type 2 diabetes mellitus with hyperglycemia: Secondary | ICD-10-CM | POA: Diagnosis not present

## 2018-04-09 ENCOUNTER — Other Ambulatory Visit: Payer: Self-pay | Admitting: Pharmacy Technician

## 2018-04-09 NOTE — Patient Outreach (Signed)
[place] [facility]) Care Management  04/09/2018  Shawn Hernandez 1950/11/19 [id number]   Received updated proof of income from patient. Faxed documents to [employer] so that they can continue processing patient assistance application for Eliquis.   Will follow up with company in 3-5 business days.  [name] [name] [place] Certified Pharmacy Technician [place] Management Direct Dial:[phone]

## 2018-04-14 ENCOUNTER — Other Ambulatory Visit: Payer: Self-pay | Admitting: Pharmacy Technician

## 2018-04-14 NOTE — Patient Outreach (Addendum)
[place] [facility]) Care Management  04/14/2018  HARPREET POMPEY [birth year] [id number]    Follow up call placed to [place] regarding patient assistance application(s) for Eliquis , Altha Harm confirms patient has been arppoved as of 3/6 until 02/05/2019.   Successful call to patient, HIPAA identifiers verified. Patient states that he has about a day left of Eliquis. Provided patient with contact number to [facility] pharmacy to request expedited shipment of medication. Patient asked the status of Trulicity due to not receiving it from providers office yet. Informed that it should have been delivered to providers office. He states office staff didn't seem to know anything about medication at his last visit.  [place] to check delivery status of Trulicity. Denzel confirms that medication was delivered to office on 2/13.  Contacted RPh Kelsy at Dr. Ashby Dawes office to verify medication had been received. Merleen Nicely to verifiy with patient nurse and to contact me back.  Will follow up with patient once Lowella Grip has returned my call.  [name] [name] [place] Certified Pharmacy Technician [place] Management Direct Dial:[phone]    ADDENDUM 12:57pm  Incoming call from [facility], he states that he located Trulicity and stated patient can come to lower level office and ask for him to assist with it.  Contacted patient and informed him of what Lowella Grip informed me of.  Will follow up with patient in 3-5 business days to confirm her received Eliquis from [facility].  [name] [name] [place] Certified Pharmacy Technician [place] Management Direct Dial:[phone]

## 2018-04-15 ENCOUNTER — Other Ambulatory Visit: Payer: Self-pay | Admitting: Pharmacy Technician

## 2018-04-15 NOTE — Patient Outreach (Signed)
[place] [facility]) Care Management  04/15/2018  HAIG GERARDO [birth year] [id number]    Incoming call from patient regarding patient assistance medication receipt from [place], HIPAA identifiers verified. Mr. Krysiak states that he contact [place] and his Eliquis will be delivered tomorrow. Reviewed how to obtain refills for approved medications: Multaq, Trulicity and Eliquis and requested he contact me if he runs into issues in the future.  Follow up:  Will route note to [place] for case closure.  [name] [name] [place] Certified Pharmacy Technician [place] Management Direct Dial:[phone]

## 2018-04-16 ENCOUNTER — Other Ambulatory Visit: Payer: Self-pay

## 2018-04-16 NOTE — Patient Outreach (Signed)
[facility] [facility]) Care Management [place]  04/16/2018  KEMARI NAREZ December 15, 1950 [id number]  Reason for referral: medication assistance  [facility] pharmacy case is being closed due to the following reasons:  Goals have been met. Patient assistance obtained for Multaq, Trulicity and Eliquis.   Patient has been provided [facility] CM contact information if assistance needed in the future.    Thank you for allowing [facility] pharmacy to be involved in this patient's care.    Joetta Manners, PharmD Clinical Pharmacist [place] [phone]

## 2018-05-20 ENCOUNTER — Ambulatory Visit (HOSPITAL_COMMUNITY)
Admission: RE | Admit: 2018-05-20 | Discharge: 2018-05-20 | Disposition: A | Discharge status: Home / Self Care | Payer: PPO | Source: Ambulatory Visit | Attending: Physician Assistant | Admitting: Physician Assistant

## 2018-05-20 ENCOUNTER — Other Ambulatory Visit: Payer: Self-pay

## 2018-05-20 VITALS — BP 130/68 | HR 78 | Temp 98.6°F

## 2018-05-20 DIAGNOSIS — I480 Paroxysmal atrial fibrillation: Secondary | ICD-10-CM

## 2018-05-20 DIAGNOSIS — R42 Dizziness and giddiness: Secondary | ICD-10-CM | POA: Diagnosis not present

## 2018-05-20 DIAGNOSIS — I2510 Atherosclerotic heart disease of native coronary artery without angina pectoris: Secondary | ICD-10-CM

## 2018-05-20 NOTE — Progress Notes (Signed)
Electrophysiology TeleHealth Note   Due to national recommendations of social distancing due to [place] 19, Audio telehealth visit is felt to be most appropriate for this patient at this time.  See consent below from today for patient consent regarding telehealth for the Atrial Fibrillation Clinic. Consent obtained verbally.   Date:  05/20/2018   ID:  Shawn Hernandez, DOB [birth year], MRN [id number]  Location: home  Provider location: [address] [place], [place] [address] Evaluation Performed: Follow up  PCP:  [name], MD  Primary Cardiologist:  Dr [place] Primary Electrophysiologist: Dr Curt Bears   CC: Follow up for atrial fibrillation   History of Present Illness: Shawn Hernandez is a 68 y.o. male who presents via audio conferencing for a telehealth visit today. Patient has a history of coronary disease status post bare-metal stent x2 to the OM in 2010 with residual 50% LAD and 70% RCA, hyperlipidemia intolerant to statins, psoriatic arthritis, obesity, paroxysmal atrial flutter s/p flutter ablation 10/2017, paroxysmal atrial fibrillation, and diabetes.  Since his ablation, he wore an event monitor which confirmed paroxysmal afib and he was started on Multaq. He reports that since his last visit, he has not had any of his fluttering symptoms. He does report an irregular feeling in his heart beat about once per month that lasts about 10 minutes which are not like his previous symptoms. No associated fatigue or SOB. He does admit to dizzy spells when he moves his head or turns too quickly.  Today, he denies symptoms of chest pain, shortness of breath, orthopnea, PND, lower extremity edema, claudication, presyncope, syncope, bleeding, or neurologic sequela. The patient is tolerating medications without difficulties and is otherwise without complaint today.   he denies symptoms of fevers, chills, or new SOB worrisome for COVID 19.  +dizziness   Atrial Fibrillation Risk  Factors:  he does not have symptoms or diagnosis of sleep apnea. he does not have a history of rheumatic fever. he does not have a history of alcohol use. The patient does not have a history of early familial atrial fibrillation or other arrhythmias.  he has a BMI of There is no height or weight on file to calculate BMI..  BP 130/68 Pulse 78 Provided by patient with home BP machine.  Past Medical History:  Diagnosis Date  . Allergy to iodine   . Arthritis    discomfort in hands and arms  . Atrial flutter, paroxysmal ([place])   . Coronary artery disease   . Diabetes type 2, controlled ([place]) 10/2016  . Diverticulosis   . Hypercholesterolemia   . Hypertension   . Psoriatic arthritis ([place])   . Sinusitis    Past Surgical History:  Procedure Laterality Date  . A-FLUTTER ABLATION N/A 10/17/2017   Procedure: A-FLUTTER ABLATION;  Surgeon: Constance Haw, MD;  Location: [facility] [facility] [place] CV LAB;  Service: Cardiovascular;  Laterality: N/A;  . COLON SURGERY  2001   2 feet removed re- diverticulosis  . CORONARY STENT PLACEMENT  2011   sequential bare-metal  . INGUINAL HERNIA REPAIR    . OTHER SURGICAL HISTORY     appendectomy     Current Outpatient Medications  Medication Sig Dispense Refill  . apixaban (ELIQUIS) 5 MG TABS tablet Take 1 tablet (5 mg total) by mouth 2 (two) times daily. 60 tablet 2  . blood glucose meter kit and supplies Dispense based on patient and insurance preference. Use up to four times daily as directed. (FOR ICD-10 E11.10). 1 each  0  . dronedarone (MULTAQ) 400 MG tablet Take 1 tablet (400 mg total) by mouth 2 (two) times daily with a meal. 60 tablet 3  . ezetimibe (ZETIA) 10 MG tablet Take 1 tablet (10 mg total) by mouth daily. 90 tablet 3  . fenofibrate (TRICOR) 145 MG tablet Take 1 tablet (145 mg total) by mouth daily. 90 tablet 3  . Magnesium 400 MG TABS Take 1 tablet by mouth every morning.    . metFORMIN (GLUCOPHAGE XR) 500 MG 24 hr tablet Take 1 tablet  (500 mg total) by mouth 2 (two) times daily with a meal. 60 tablet 0  . olmesartan (BENICAR) 20 MG tablet Take 20 mg by mouth daily.    . TRULICITY 0.62 IR/4.8NI SOPN Inject 0.75 mg into the skin once a week. Friday     No current facility-administered medications for this encounter.     Allergies:   Ivp dye [iodinated diagnostic agents]; Lisinopril; Statins; and Iodine   Social History:  The patient  reports that he has never smoked. He has never used smokeless tobacco.   Family History:  The patient's family history includes Diabetes in his brother; Heart disease in his brother, father, and mother.    ROS:  Please see the history of present illness. All other systems are personally reviewed and negative.    Recent Labs: 10/17/2017: BUN 9; Creatinine, Ser 1.40; Hemoglobin 15.2; Platelets 284; Potassium 4.2; Sodium 138  personally reviewed    Other studies personally reviewed: Additional studies/ records that were reviewed today include: Epic notes, event monitor  30 day monitor 01/17/18 Sinus rhythm PACs associated with palpitations Less than 1% atrial fibrillation, fastest heart rate 136 bpm Less than 1% PVCs Less than 1% PACs  Myoview 07/2017  The left ventricular ejection fraction is mildly decreased (45-54%).  Nuclear stress EF: 54%.  There was no ST segment deviation noted during stress.  This is a low risk study.   ASSESSMENT AND PLAN:  1. Paroxysmal atrial fibrillation/atrial flutter S/p flutter ablation by Dr Curt Bears 10/2017. Symptoms of fluttering improved with Multaq. Continue Multaq 400 mg BID Continue Eliquis 5 mg BID Check CMP/CBC on follow up.  This patients CHA2DS2-VASc Score and unadjusted Ischemic Stroke Rate (% per year) is equal to 2.2 % stroke rate/year from a score of 2  Above score calculated as 1 point each if present [CHF, HTN, DM, Vascular=MI/PAD/Aortic Plaque, Age if 65-74, or Male] Above score calculated as 2 points each if present  [Age > 75, or Stroke/TIA/TE]  2. CAD No anginal symptoms. Continue present therapy and risk factor modification.  3. Dizziness Appears to be positional, likely vertigo. No syncope or falls. Patient to follow up with PCP.    COVID screen The patient does not have any symptoms that suggest any further testing/ screening at this time.  Social distancing reinforced today.    Follow-up with AF clinic in 3 months.  Current medicines are reviewed at length with the patient today.   The patient does not have concerns regarding his medicines.  The following changes were made today:  none  Labs/ tests ordered today include:  No orders of the defined types were placed in this encounter.   Patient Risk:  after full review of this patients clinical status, I feel that they are at moderate risk at this time.   Today, I have spent 19 minutes with the patient with telehealth technology discussing atrial fibrillation, Multaq, and COVID-19 precautions.    Signed, Adline Peals  PA-C 05/20/2018 11:24 AM  [facility] [address] [place], [place] [address] [phone]   I hereby voluntarily request, consent and authorize the [facility] and its employed or contracted physicians, physician assistants, nurse practitioners or other licensed health care professionals (the Practitioner), to provide me with telemedicine health care services (the "Services") as deemed necessary by the treating Practitioner. I acknowledge and consent to receive the Services by the Practitioner via telemedicine. I understand that the telemedicine visit will involve communicating with the Practitioner through live audiovisual communication technology and the disclosure of certain medical information by electronic transmission. I acknowledge that I have been given the opportunity to request an in-person assessment or other available alternative prior to the telemedicine visit and am  voluntarily participating in the telemedicine visit.   I understand that I have the right to withhold or withdraw my consent to the use of telemedicine in the course of my care at any time, without affecting my right to future care or treatment, and that the Practitioner or I may terminate the telemedicine visit at any time. I understand that I have the right to inspect all information obtained and/or recorded in the course of the telemedicine visit and may receive copies of available information for a reasonable fee.  I understand that some of the potential risks of receiving the Services via telemedicine include:   Delay or interruption in medical evaluation due to technological equipment failure or disruption;  Information transmitted may not be sufficient (e.g. poor resolution of images) to allow for appropriate medical decision making by the Practitioner; and/or  In rare instances, security protocols could fail, causing a breach of personal health information.   Furthermore, I acknowledge that it is my responsibility to provide information about my medical history, conditions and care that is complete and accurate to the best of my ability. I acknowledge that Practitioner's advice, recommendations, and/or decision may be based on factors not within their control, such as incomplete or inaccurate data provided by me or distortions of diagnostic images or specimens that may result from electronic transmissions. I understand that the practice of medicine is not an exact science and that Practitioner makes no warranties or guarantees regarding treatment outcomes. I acknowledge that I will receive a copy of this consent concurrently upon execution via email to the email address I last provided but may also request a printed copy by calling the office of the [facility].  I understand that my insurance will be billed for this visit.   I have read or had this consent read to me.  I  understand the contents of this consent, which adequately explains the benefits and risks of the Services being provided via telemedicine.  I have been provided ample opportunity to ask questions regarding this consent and the Services and have had my questions answered to my satisfaction.  I give my informed consent for the services to be provided through the use of telemedicine in my medical care  By participating in this telemedicine visit I agree to the above.

## 2018-08-11 ENCOUNTER — Other Ambulatory Visit: Payer: Self-pay

## 2018-08-11 MED ORDER — FENOFIBRATE 145 MG PO TABS: 145.0000 mg | ORAL_TABLET | Freq: Every day | ORAL | 3 refills | Status: DC

## 2018-08-20 ENCOUNTER — Ambulatory Visit (HOSPITAL_COMMUNITY)
Admission: RE | Admit: 2018-08-20 | Discharge: 2018-08-20 | Disposition: A | Discharge status: Home / Self Care | Payer: PPO | Source: Ambulatory Visit | Attending: Physician Assistant | Admitting: Physician Assistant

## 2018-08-20 ENCOUNTER — Encounter (HOSPITAL_COMMUNITY): Payer: Self-pay | Admitting: Physician Assistant

## 2018-08-20 ENCOUNTER — Other Ambulatory Visit: Payer: Self-pay

## 2018-08-20 VITALS — BP 126/76 | HR 73 | Ht 72.0 in | Wt 247.0 lb

## 2018-08-20 DIAGNOSIS — L4050 Arthropathic psoriasis, unspecified: Secondary | ICD-10-CM | POA: Diagnosis not present

## 2018-08-20 DIAGNOSIS — K5790 Diverticulosis of intestine, part unspecified, without perforation or abscess without bleeding: Secondary | ICD-10-CM | POA: Insufficient documentation

## 2018-08-20 DIAGNOSIS — I4892 Unspecified atrial flutter: Secondary | ICD-10-CM | POA: Diagnosis not present

## 2018-08-20 DIAGNOSIS — Z7901 Long term (current) use of anticoagulants: Secondary | ICD-10-CM | POA: Insufficient documentation

## 2018-08-20 DIAGNOSIS — Z8249 Family history of ischemic heart disease and other diseases of the circulatory system: Secondary | ICD-10-CM | POA: Diagnosis not present

## 2018-08-20 DIAGNOSIS — I10 Essential (primary) hypertension: Secondary | ICD-10-CM | POA: Insufficient documentation

## 2018-08-20 DIAGNOSIS — Z79899 Other long term (current) drug therapy: Secondary | ICD-10-CM | POA: Insufficient documentation

## 2018-08-20 DIAGNOSIS — Z7984 Long term (current) use of oral hypoglycemic drugs: Secondary | ICD-10-CM | POA: Diagnosis not present

## 2018-08-20 DIAGNOSIS — I480 Paroxysmal atrial fibrillation: Secondary | ICD-10-CM

## 2018-08-20 DIAGNOSIS — E7800 Pure hypercholesterolemia, unspecified: Secondary | ICD-10-CM | POA: Diagnosis not present

## 2018-08-20 DIAGNOSIS — E119 Type 2 diabetes mellitus without complications: Secondary | ICD-10-CM | POA: Diagnosis not present

## 2018-08-20 DIAGNOSIS — I2510 Atherosclerotic heart disease of native coronary artery without angina pectoris: Secondary | ICD-10-CM | POA: Diagnosis not present

## 2018-08-20 LAB — COMPREHENSIVE METABOLIC PANEL
ALT: 26 U/L (ref 0–44)
AST: 28 U/L (ref 15–41)
Albumin: 3.9 g/dL (ref 3.5–5.0)
Alkaline Phosphatase: 66 U/L (ref 38–126)
Anion gap: 7 (ref 5–15)
BUN: 16 mg/dL (ref 8–23)
CO2: 26 mmol/L (ref 22–32)
Calcium: 9.3 mg/dL (ref 8.9–10.3)
Chloride: 106 mmol/L (ref 98–111)
Creatinine, Ser: 1.53 mg/dL — ABNORMAL HIGH (ref 0.61–1.24)
GFR calc Af Amer: 53 mL/min — ABNORMAL LOW (ref >60)
GFR calc non Af Amer: 46 mL/min — ABNORMAL LOW (ref >60)
Glucose, Bld: 203 mg/dL — ABNORMAL HIGH (ref 70–99)
Potassium: 4 mmol/L (ref 3.5–5.1)
Sodium: 139 mmol/L (ref 135–145)
Total Bilirubin: 0.3 mg/dL (ref 0.3–1.2)
Total Protein: 6.8 g/dL (ref 6.5–8.1)

## 2018-08-20 LAB — TSH: TSH: 3.392 u[IU]/mL (ref 0.350–4.500)

## 2018-08-20 NOTE — Progress Notes (Signed)
Primary Care Physician: [name], MD Primary Cardiologist: Dr Martinique Primary Electrophysiologist: Dr Curt Bears Referring Physician: Dr Lorain Childes is a 68 y.o. male with a history of coronary disease status post bare-metal stent x2 to the OM in 2010 with residual 50% LAD and 70% RCA, hyperlipidemia intolerant to statins, psoriatic arthritis, obesity, paroxysmal atrial flutter s/p flutter ablation 10/2017, paroxysmal atrial fibrillation, and diabetes who presents for follow up in the [facility]. Since his last visit, he reports that he has done well with rare palpitations. He does note that he had some nausea when first starting the Multaq but this has improved. He denies significant snoring or alcohol use.  Today, he denies symptoms of chest pain, shortness of breath, orthopnea, PND, lower extremity edema, dizziness, presyncope, syncope, snoring, daytime somnolence, bleeding, or neurologic sequela. The patient is tolerating medications without difficulties and is otherwise without complaint today.    Atrial Fibrillation Risk Factors:  he does not have symptoms or diagnosis of sleep apnea. he does not have a history of rheumatic fever. he does not have a history of alcohol use. The patient does not have a history of early familial atrial fibrillation or other arrhythmias.  he has a BMI of Body mass index is 33.5 kg/m.Marland Kitchen Filed Weights   08/20/18 1344  Weight: 112 kg    Family History  Problem Relation Age of Onset  . Heart disease Mother   . Heart disease Father   . Heart disease Brother   . Diabetes Brother      Atrial Fibrillation Management history:  Previous antiarrhythmic drugs: Multaq Previous cardioversions: none Previous ablations: 10/2017 (flutter) CHADS2VASC score: 3 Anticoagulation history: Eliquis   Past Medical History:  Diagnosis Date  . Allergy to iodine   . Arthritis    discomfort in hands and arms  .  Atrial flutter, paroxysmal ([place])   . Coronary artery disease   . Diabetes type 2, controlled ([place]) 10/2016  . Diverticulosis   . Hypercholesterolemia   . Hypertension   . Psoriatic arthritis ([place])   . Sinusitis    Past Surgical History:  Procedure Laterality Date  . A-FLUTTER ABLATION N/A 10/17/2017   Procedure: A-FLUTTER ABLATION;  Surgeon: Constance Haw, MD;  Location: [place] [facility] [facility];  Service: Cardiovascular;  Laterality: N/A;  . COLON SURGERY  2001   2 feet removed re- diverticulosis  . CORONARY STENT PLACEMENT  2011   sequential bare-metal  . INGUINAL HERNIA REPAIR    . OTHER SURGICAL HISTORY     appendectomy    Current Outpatient Medications  Medication Sig Dispense Refill  . apixaban (ELIQUIS) 5 MG TABS tablet Take 1 tablet (5 mg total) by mouth 2 (two) times daily. 60 tablet 2  . blood glucose meter kit and supplies Dispense based on patient and insurance preference. Use up to four times daily as directed. (FOR ICD-10 E11.10). 1 each 0  . dronedarone (MULTAQ) 400 MG tablet Take 1 tablet (400 mg total) by mouth 2 (two) times daily with a meal. 60 tablet 3  . ezetimibe (ZETIA) 10 MG tablet Take 1 tablet (10 mg total) by mouth daily. 90 tablet 3  . fenofibrate (TRICOR) 145 MG tablet Take 1 tablet (145 mg total) by mouth daily. 90 tablet 3  . Magnesium 400 MG TABS Take 1 tablet by mouth every morning.    . metFORMIN (GLUCOPHAGE XR) 500 MG 24 hr tablet Take 1 tablet (500 mg total) by  mouth 2 (two) times daily with a meal. 60 tablet 0  . TRULICITY 0.98 JX/9.1YN SOPN Inject 0.75 mg into the skin once a week. Friday    . olmesartan (BENICAR) 20 MG tablet Take 20 mg by mouth daily.     No current facility-administered medications for this encounter.     Allergies  Allergen Reactions  . Ivp Dye [Iodinated Diagnostic Agents] Hives  . Lisinopril Other (See Comments)    cough  . Statins Other (See Comments)    myalgias  . Iodine Rash    Social History    Socioeconomic History  . Marital status: Single    Spouse name: Not on file  . Number of children: 0  . Years of education: Not on file  . Highest education level: Not on file  Occupational History    Employer: [employer]  . Financial resource strain: Not on file  . Food insecurity    Worry: Not on file    Inability: Not on file  . Transportation needs    Medical: Not on file    Non-medical: Not on file  Tobacco Use  . Smoking status: Never Smoker  . Smokeless tobacco: Never Used  Substance and Sexual Activity  . Alcohol use: Not on file  . Drug use: Not on file  . Sexual activity: Not on file  Lifestyle  . Physical activity    Days per week: Not on file    Minutes per session: Not on file  . Stress: Not on file  Relationships  . Social Herbalist on phone: Not on file    Gets together: Not on file    Attends religious service: Not on file    Active member of club or organization: Not on file    Attends meetings of clubs or organizations: Not on file    Relationship status: Not on file  . Intimate partner violence    Fear of current or ex partner: Not on file    Emotionally abused: Not on file    Physically abused: Not on file    Forced sexual activity: Not on file  Other Topics Concern  . Not on file  Social History Narrative  . Not on file     ROS- All systems are reviewed and negative except as per the HPI above.  Physical Exam: Vitals:   08/20/18 1344  BP: 126/76  Pulse: 73  Weight: 112 kg  Height: 6' (1.829 m)    GEN- The patient is well appearing obese male, alert and oriented x 3 today.   Head- normocephalic, atraumatic Eyes-  Sclera clear, conjunctiva pink Ears- hearing intact Oropharynx- clear Neck- supple  Lungs- Clear to ausculation bilaterally, normal work of breathing Heart- Regular rate and rhythm, no murmurs, rubs or gallops  GI- soft, NT, ND, + BS Extremities- no clubbing, cyanosis, or edema MS- no significant  deformity or atrophy Skin- no rash or lesion Psych- euthymic mood, full affect Neuro- strength and sensation are intact  Wt Readings from Last 3 Encounters:  08/20/18 112 kg  02/17/18 111.6 kg  11/19/17 110.2 kg    EKG today demonstrates SR HR 73, LAD, PR 184, QRS 82, QTc 425   30 day monitor12/13/19 Sinus rhythm PACs associated with palpitations Less than 1% atrial fibrillation, fastest heart rate 136 bpm Less than 1% PVCs Less than 1% PACs  Myoview 07/2017  The left ventricular ejection fraction is mildly decreased (45-54%).  Nuclear stress EF:  54%.  There was no ST segment deviation noted during stress.  This is a low risk study.   Epic records are reviewed at length today  Assessment and Plan:  1. Paroxysmal atrial fibrillation/atrial flutter S/p atrial flutter ablation with Dr Curt Bears 10/2017. Patient appears to be maintaining SR with only brief episodes of palpitations.  Continue Multaq 400 mg BID Continue Eliquis 5 mg BID Cmet/TSH today We discussed alternative AAD vs afib ablation if his symptoms should become more persistent.   This patients CHA2DS2-VASc Score and unadjusted Ischemic Stroke Rate (% per year) is equal to 3.2 % stroke rate/year from a score of 3  Above score calculated as 1 point each if present [CHF, HTN, DM, Vascular=MI/PAD/Aortic Plaque, Age if 65-74, or Male] Above score calculated as 2 points each if present [Age > 75, or Stroke/TIA/TE]   2. CAD No anginal symptoms. Continue present therapy and risk factor modification.    Follow up with Dr Curt Bears in 3 months. AF clinic in 9 months.    [facility] [address] [place], [place] [address] [phone] 08/20/2018 2:30 PM

## 2018-09-22 ENCOUNTER — Other Ambulatory Visit: Payer: Self-pay

## 2018-09-22 MED ORDER — EZETIMIBE 10 MG PO TABS: 10.0000 mg | ORAL_TABLET | Freq: Every day | ORAL | 3 refills | Status: DC

## 2018-10-14 DIAGNOSIS — E1165 Type 2 diabetes mellitus with hyperglycemia: Secondary | ICD-10-CM | POA: Diagnosis not present

## 2018-10-14 DIAGNOSIS — E782 Mixed hyperlipidemia: Secondary | ICD-10-CM | POA: Diagnosis not present

## 2018-10-21 ENCOUNTER — Other Ambulatory Visit: Payer: Self-pay

## 2018-10-21 ENCOUNTER — Encounter: Payer: Self-pay | Admitting: Physician Assistant

## 2018-10-21 ENCOUNTER — Ambulatory Visit (INDEPENDENT_AMBULATORY_CARE_PROVIDER_SITE_OTHER): Payer: PPO | Admitting: General Practice

## 2018-10-21 VITALS — BP 136/84 | HR 72 | Temp 97.5°F | Ht 72.0 in | Wt 243.0 lb

## 2018-10-21 DIAGNOSIS — E7800 Pure hypercholesterolemia, unspecified: Secondary | ICD-10-CM | POA: Diagnosis not present

## 2018-10-21 DIAGNOSIS — E1169 Type 2 diabetes mellitus with other specified complication: Secondary | ICD-10-CM | POA: Diagnosis not present

## 2018-10-21 DIAGNOSIS — I10 Essential (primary) hypertension: Secondary | ICD-10-CM | POA: Diagnosis not present

## 2018-10-21 DIAGNOSIS — S161XXA Strain of muscle, fascia and tendon at neck level, initial encounter: Secondary | ICD-10-CM

## 2018-10-21 DIAGNOSIS — N183 Chronic kidney disease, stage 3 (moderate): Secondary | ICD-10-CM | POA: Diagnosis not present

## 2018-10-21 DIAGNOSIS — N179 Acute kidney failure, unspecified: Secondary | ICD-10-CM | POA: Diagnosis not present

## 2018-10-21 DIAGNOSIS — Z23 Encounter for immunization: Secondary | ICD-10-CM | POA: Diagnosis not present

## 2018-10-21 DIAGNOSIS — I2510 Atherosclerotic heart disease of native coronary artery without angina pectoris: Secondary | ICD-10-CM

## 2018-10-21 DIAGNOSIS — E1165 Type 2 diabetes mellitus with hyperglycemia: Secondary | ICD-10-CM | POA: Diagnosis not present

## 2018-10-21 DIAGNOSIS — I129 Hypertensive chronic kidney disease with stage 1 through stage 4 chronic kidney disease, or unspecified chronic kidney disease: Secondary | ICD-10-CM | POA: Diagnosis not present

## 2018-10-21 DIAGNOSIS — I480 Paroxysmal atrial fibrillation: Secondary | ICD-10-CM | POA: Diagnosis not present

## 2018-10-21 DIAGNOSIS — Z7189 Other specified counseling: Secondary | ICD-10-CM | POA: Diagnosis not present

## 2018-10-21 DIAGNOSIS — E782 Mixed hyperlipidemia: Secondary | ICD-10-CM | POA: Diagnosis not present

## 2018-10-21 DIAGNOSIS — I4892 Unspecified atrial flutter: Secondary | ICD-10-CM | POA: Diagnosis not present

## 2018-10-21 NOTE — Progress Notes (Signed)
Cardiology Clinic Note   Patient Name: Shawn Hernandez Date of Encounter: 10/21/2018  Primary Care Provider:  [name], MD Primary Cardiologist:  Peter Martinique, MD  Patient Profile    Shawn Hernandez 68 year old male presents today for follow-up of his PAF, CAD, and HTN.  Past Medical History    Past Medical History:  Diagnosis Date   Allergy to iodine    Arthritis    discomfort in hands and arms   Atrial flutter, paroxysmal ([place])    Coronary artery disease    Diabetes type 2, controlled ([place]) 10/2016   Diverticulosis    Hypercholesterolemia    Hypertension    Psoriatic arthritis ([place])    Sinusitis    Past Surgical History:  Procedure Laterality Date   A-FLUTTER ABLATION N/A 10/17/2017   Procedure: A-FLUTTER ABLATION;  Surgeon: Constance Haw, MD;  Location: [place] [facility];  Service: Cardiovascular;  Laterality: N/A;   COLON SURGERY  2001   2 feet removed re- diverticulosis   CORONARY STENT PLACEMENT  2011   sequential bare-metal   INGUINAL HERNIA REPAIR     OTHER SURGICAL HISTORY     appendectomy    Allergies  Allergies  Allergen Reactions   Ivp Dye [Iodinated Diagnostic Agents] Hives   Lisinopril Other (See Comments)    cough   Statins Other (See Comments)    myalgias   Iodine Rash    History of Present Illness    Shawn Hernandez was last seen by Dr. Curt Bears 02/17/2018.  During that time he was doing well, denied chest pain, SOB, orthopnea and PND.  He was tolerating his medications without difficulty.  His only complaint was that he was not able to tolerate exercise due to increased palpitations.  He had noted 2 episodes of palpitations over the previous 4 months.  He was started on Multaq 400 mg twice daily.  He has a history of coronary artery disease with 2 bare-metal stents to the OM in 2010 with residual 50% LAD and 70% RCA stenosis, hyperlipidemia with statin intolerance, psoriatic arthritis, obesity, paroxysmal  atrial flutter, and diabetes.  He had an atrial flutter ablation on 10/18/2017.  He presents the clinic today and states he is worried about carotid stenosis and would like to have a carotid ultrasound.  He states he has had posterior right-sided neck pain and that has father had a carotid endarterectomy years ago.  He has been playing golf, riding a cart, a few times a week.  He states that other than playing golf 1-2 times per week he does not do any other physical activity and notices that he fatigues easily.  When asked to do neck range of motion activities his right neck pain is reproduced.  He states that the pain started about 3 weeks ago, has gotten little better but, he has not taken anything for pain.  He also states he has noticed some dizziness when moving from a laying to standing position.  He states that he does not consume very many p.o. fluids, couple coffee in the morning and about 3 cups of tea through the day.  He has only noticed a few episodes of palpitations since starting his Multaq medication and feels it is working well.  He denies chest pain, shortness of breath, lower extremity edema, fatigue, palpitations, melena, hematuria, hemoptysis, diaphoresis, weakness, presyncope, syncope, orthopnea, and PND.   Home Medications    Prior to Admission medications   Medication Sig Start Date End Date  Taking? Authorizing Provider  apixaban (ELIQUIS) 5 MG TABS tablet Take 1 tablet (5 mg total) by mouth 2 (two) times daily. 02/26/18   Camnitz, Ocie Doyne, MD  blood glucose meter kit and supplies Dispense based on patient and insurance preference. Use up to four times daily as directed. (FOR ICD-10 E11.10). 10/21/16   Murlean Iba, MD  dronedarone (MULTAQ) 400 MG tablet Take 1 tablet (400 mg total) by mouth 2 (two) times daily with a meal. 02/17/18   Camnitz, Ocie Doyne, MD  ezetimibe (ZETIA) 10 MG tablet Take 1 tablet (10 mg total) by mouth daily. 09/22/18   Barrett, Evelene Croon, PA-C    fenofibrate (TRICOR) 145 MG tablet Take 1 tablet (145 mg total) by mouth daily. 08/11/18   Barrett, Evelene Croon, PA-C  Magnesium 400 MG TABS Take 1 tablet by mouth every morning.    [provider]  metFORMIN (GLUCOPHAGE XR) 500 MG 24 hr tablet Take 1 tablet (500 mg total) by mouth 2 (two) times daily with a meal. 10/21/16   Johnson, Clanford L, MD  olmesartan (BENICAR) 20 MG tablet Take 20 mg by mouth daily.    [provider]  TRULICITY 3.29 JJ/8.8CZ SOPN Inject 0.75 mg into the skin once a week. Friday 08/27/17   [provider]    Family History    Family History  Problem Relation Age of Onset   Heart disease Mother    Heart disease Father    Heart disease Brother    Diabetes Brother    He indicated that the status of his mother is unknown. He indicated that the status of his father is unknown. He indicated that the status of his brother is unknown.  Social History    Social History   Socioeconomic History   Marital status: Single    Spouse name: Not on file   Number of children: 0   Years of education: Not on file   Highest education level: Not on file  Occupational History    Employer: [employer]  Social Needs   Financial resource strain: Not on file   Food insecurity    Worry: Not on file    Inability: Not on file   Transportation needs    Medical: Not on file    Non-medical: Not on file  Tobacco Use   Smoking status: Never Smoker   Smokeless tobacco: Never Used  Substance and Sexual Activity   Alcohol use: Not on file   Drug use: Not on file   Sexual activity: Not on file  Lifestyle   Physical activity    Days per week: Not on file    Minutes per session: Not on file   Stress: Not on file  Relationships   Social connections    Talks on phone: Not on file    Gets together: Not on file    Attends religious service: Not on file    Active member of club or organization: Not on file    Attends meetings of clubs or  organizations: Not on file    Relationship status: Not on file   Intimate partner violence    Fear of current or ex partner: Not on file    Emotionally abused: Not on file    Physically abused: Not on file    Forced sexual activity: Not on file  Other Topics Concern   Not on file  Social History Narrative   Not on file     Review of Systems  General:  No chills, fever, night sweats or weight changes.  Cardiovascular:  No chest pain, dyspnea on exertion, edema, orthopnea, palpitations, paroxysmal nocturnal dyspnea. Dermatological: No rash, lesions/masses Respiratory: No cough, dyspnea Urologic: No hematuria, dysuria Abdominal:   No nausea, vomiting, diarrhea, bright red blood per rectum, melena, or hematemesis Neurologic:  No visual changes, wkns, changes in mental status. All other systems reviewed and are otherwise negative except as noted above.  Physical Exam    VS:  BP 136/84    Pulse 72    Temp (!) 97.5 F (36.4 C) (Temporal)    Ht 6' (1.829 m)    Wt 243 lb (110.2 kg)    SpO2 96%    BMI 32.96 kg/m  , BMI Body mass index is 32.96 kg/m. GEN: Well nourished, well developed, in no acute distress. HEENT: normal. Neck: Supple, no JVD, carotid bruits, or masses. Cardiac: RRR, no murmurs, rubs, or gallops. No clubbing, cyanosis, edema.  Radials/DP/PT 2+ and equal bilaterally.  Respiratory:  Respirations regular and unlabored, clear to auscultation bilaterally. GI: Soft, nontender, nondistended, BS + x 4. MS: no deformity or atrophy.  Increased muscle tension and limited range of motion throughout right neck/trapezius. Skin: warm and dry, no rash. Neuro:  Strength and sensation are intact. Psych: Normal affect.  Accessory Clinical Findings    ECG personally reviewed by me today- none today.  EKG 08/20/2018 Normal sinus rhythm left axis deviation left atrial abnormality 73 bpm  Cardiac event monitor 12/09/2017 Sinus rhythm PACs associated with palpitations Less than  1% atrial fibrillation, fastest heart rate 136 bpm Less than 1% PVCs Less than 1% PACs  Myocardial perfusion study 07/23/2017  The left ventricular ejection fraction is mildly decreased (45-54%).  Nuclear stress EF: 54%.  There was no ST segment deviation noted during stress.  This is a low risk study.   1. EF 54%, low normal to mildly reduced.  2. No evidence for ischemia or infarction on perfusion images.   Low risk study.   Assessment & Plan   1.  PAF- HR 72 followed by A. fib clinic and EP. Continue apixaban 5 mg tablet twice daily Continue Multaq 400 mg twice daily Limit caffeine intake-educated about caffeinated drinks  2.  Coronary artery disease-no chest pain today.  Continue Zetia 10 mg daily Continue fenofibrate 145 mg daily Low-sodium heart healthy diet-increase fiber and increase p.o. fluid intake Increase physical activity as tolerated  3.  Hyperlipidemia- LDL 104 Continue Zetia 10 mg daily Continue fenofibrate 145 mg daily Low-sodium heart healthy diet-increase fiber Increase physical activity as tolerated  4.  Essential hypertension-blood pressure today 136/84 Continue olmesartan 20 mg daily Low-sodium heart healthy diet Increase physical activity as tolerated- start walking 15 min EOD.  5.  Acute kidney injury-BUN stable at 16 Lab Results  Component Value Date   CREATININE 1.53 (H) 08/20/2018   CREATININE 1.40 (H) 10/17/2017   CREATININE 1.00 10/21/2016  Obtain labs from PCP drawn last week.  Creatinine 1.6 ,BUN 16 10/14/2018 Being followed and monitored by PCP  6. Right neck strain-Limited ROM and muscle tightness throughout right trapezius muscle. Noticed right neck pain started 3 weeks ago after playing golf. Use ice or heat on injured area Light stretching Right neck/trapezious massage Increase activity slowly  Disposition: Follow-up with Dr. Martinique in 1 year.  Deberah Pelton, NP-C 10/21/2018, 2:40 PM

## 2018-10-21 NOTE — Patient Instructions (Addendum)
Medication Instructions:  Your physician recommends that you continue on your current medications as directed. Please refer to the Current Medication list given to you today.  If you need a refill on your cardiac medications before your next appointment, please call your pharmacy.   Lab work: NONE ordered at this time of appointment   If you have labs (blood work) drawn today and your tests are completely normal, you will receive your results only by: Marland Kitchen MyChart Message (if you have MyChart) OR . A paper copy in the mail If you have any lab test that is abnormal or we need to change your treatment, we will call you to review the results.  Testing/Procedures: NONE ordered at this time of appointment   Follow-Up: At [facility], you and your health needs are our priority.  As part of our continuing mission to provide you with exceptional heart care, we have created designated Provider Care Teams.  These Care Teams include your primary Cardiologist (physician) and Advanced Practice Providers (APPs -  Physician Assistants and Nurse Practitioners) who all work together to provide you with the care you need, when you need it. You will need a follow up appointment in 12 months-September 2021.  Please call our office June 2021 to schedule this appointment.  You may see Peter Martinique, MD or one of the following Advanced Practice Providers on your designated Care Team: [place], [place] . Fabian Sharp, PA-C  Any Other Special Instructions Will Be Listed Below (If Applicable).  INCREASE physical activity 15 mins every other day  May do light stretching  Rotate between ice and heat every 20 mins to pain in right shoulder  Increase daily fluid intake

## 2018-10-22 ENCOUNTER — Telehealth: Payer: Self-pay

## 2018-10-22 ENCOUNTER — Telehealth: Payer: Self-pay | Admitting: Cardiology

## 2018-10-22 NOTE — Telephone Encounter (Signed)
New Message    Patient calling the office for samples of medication:   1.  What medication and dosage are you requesting samples for? Multaq 400mg  1 tablet 2 times daily  2.  Are you currently out of this medication?   Yes

## 2018-10-22 NOTE — Telephone Encounter (Signed)
I agree Marzetta Board. I was just routing the call from the operator. I did not s/w the pt.

## 2018-10-22 NOTE — Telephone Encounter (Signed)
Not clear as to which medication pt is calling about. I am going to route this call to A-fib clinic as well as Sherri P. RN for Dr. Curt Bears.

## 2018-10-22 NOTE — Telephone Encounter (Signed)
We received a Pt Asst Program Refill Request for Multaq form from [employer]. There were 3 questions on the form as follows:  1. Is the pt in need of a refill? Ans: YES 2. Has there been any changes in the pts ins coverage? Ans: NO 3. Have there been any changes in the patients dosage? Ans: NO  Form states that Dr [employer] signature is not required if no changes have been made.  We have faxed the completed form back to [employer].

## 2018-10-22 NOTE — Telephone Encounter (Signed)
[employer] regarding patient assistance Multaq, Lorna Few confirms patient has been approved as of 2/17 until 02/05/19. Medication should arrive at providers office in the next 7-10 business days.  Multaq usually sent in 3 month increments to provider office - appears this is through Dr. Curt Bears office. Will need to call [facility] to request refill request form be faxed to office to request next 3 month supply. May have to provide patient samples of medication until shipment arrives.

## 2018-10-22 NOTE — Telephone Encounter (Signed)
New Message   Patient is calling because a patient assistant request was to be sent [employer] and he is now out of medication. Please call.

## 2018-10-22 NOTE — Telephone Encounter (Signed)
Our office does not have any samples. Spoke to AFib clinic and they have 9 days of samples for pt.   Pt given parking code and will pick up medicine this week. He appreciates our help with this. Going forward, made aware to call when he opens his last bottle so that there is no lapse in medication in the future. Patient verbalized understanding and agreeable to plan.

## 2018-10-29 ENCOUNTER — Telehealth: Payer: Self-pay | Admitting: Cardiology

## 2018-10-29 NOTE — Telephone Encounter (Signed)
Called pt and left message informing pt that his medication Multaq 400 mg tablet, 3 bottles of 60 tablets,from pt assistance program, are ready to be pick up at the office and if pt has any other problems, questions or concerns, to call the office.

## 2018-12-17 DIAGNOSIS — I2510 Atherosclerotic heart disease of native coronary artery without angina pectoris: Secondary | ICD-10-CM | POA: Diagnosis not present

## 2018-12-17 DIAGNOSIS — Z7189 Other specified counseling: Secondary | ICD-10-CM | POA: Diagnosis not present

## 2018-12-17 DIAGNOSIS — E782 Mixed hyperlipidemia: Secondary | ICD-10-CM | POA: Diagnosis not present

## 2018-12-17 DIAGNOSIS — E1165 Type 2 diabetes mellitus with hyperglycemia: Secondary | ICD-10-CM | POA: Diagnosis not present

## 2018-12-18 ENCOUNTER — Other Ambulatory Visit: Payer: Self-pay | Admitting: Pharmacy Technician

## 2018-12-18 NOTE — Patient Outreach (Signed)
[name] Care Management  12/18/2018  Shawn Hernandez 1951-01-29 [id number]  Incoming call from patient who left a voicemail on work mobile number in regards to questions he had concerning 2021 patient assistance.  The message was intended for pharmacist Joetta Manners who is no longer with [facility] CM.  Patient's message informed he had questions about 2021 patient assistance.  Will route note to [place] who assisted him with multiple patient assistance applications earlier in 2020.  Shawn Hernandez, [facility] Management [phone]

## 2019-01-15 ENCOUNTER — Other Ambulatory Visit: Payer: Self-pay | Admitting: *Deleted

## 2019-01-15 ENCOUNTER — Telehealth: Payer: Self-pay | Admitting: Cardiology

## 2019-01-15 NOTE — Telephone Encounter (Signed)
I am unfamiliar with this pts asst for multaq only that it is delivered to our office and we call to let him know to pick up.  Per Asley Coleman's CPhT note in the pts chart from 03/26/2018 she helped him apply for asst with his Multaq and it looks like it maybe time for him to re-apply.  Will forward this phone note to [place] to contact the pt with advisement.

## 2019-01-15 NOTE — Patient Outreach (Signed)
[facility]) Care Management  01/15/2019  Shawn Hernandez 07/07/1950 [id number]   Case reviewed, no patient outreach needed,  and case closed per Bary Castilla, Assistant Clinical Director at [facility]  request.   Colbert Coyer. Annia Friendly, BSN, [place] Management [facility] Telephonic CM Phone: [phone] Fax: [phone]

## 2019-01-15 NOTE — Telephone Encounter (Signed)
New Message  Patient is calling in to check on medication Multaq. Patient states that it was supposed to be either shipped to home or picked up. Please give patient a call back to advise.

## 2019-01-16 NOTE — Telephone Encounter (Signed)
Pt completed paperwork for this and it is in Dr. Macky Lower folder to sign next week

## 2019-01-19 NOTE — Telephone Encounter (Signed)
Patient was following up on the request for this medication. He would like Sherri to call him as soon as she gets a chance. He only has 15 days worth of the medication left and does not want to run out.

## 2019-01-20 NOTE — Telephone Encounter (Signed)
lmtcb

## 2019-01-22 ENCOUNTER — Telehealth: Payer: Self-pay

## 2019-01-22 NOTE — Telephone Encounter (Signed)
Received notification that [employer] has received the pt's Pt Asst application for Eliquis and will process for a determination.

## 2019-01-22 NOTE — Telephone Encounter (Addendum)
Followed up with pt earlier today. Aware that we will see what we can do to help with Multaq. Left 6 days of samples at front desk (all we had on hand in the office). April Garrison, CMA is helping me with trying to obtain samples through company and trying to help w/ the pt assistance.

## 2019-01-26 NOTE — Telephone Encounter (Signed)
Received notification from [employer] that they didn't receive an RX to go with the Pt Asst application. I completed one and had Dr Curt Bears sign and it was faxed.

## 2019-02-04 DIAGNOSIS — G720 Drug-induced myopathy: Secondary | ICD-10-CM | POA: Insufficient documentation

## 2019-02-09 ENCOUNTER — Telehealth: Payer: Self-pay | Admitting: Cardiology

## 2019-02-09 NOTE — Telephone Encounter (Signed)
Left detailed message informing pt that I have a 30 day supply. Explained that I would try and reach him again before leaving the office today.

## 2019-02-09 NOTE — Telephone Encounter (Signed)
lmtcb

## 2019-02-09 NOTE — Telephone Encounter (Signed)
  Patient calling the office for samples of medication:   1.  What medication and dosage are you requesting samples for? dronedarone (MULTAQ) 400 MG tablet  2.  Are you currently out of this medication? Patient has a couple of days left.

## 2019-02-10 NOTE — Telephone Encounter (Signed)
Pt aware 30 day supply left downstairs for him to pick up at his convenience.  He and I agreed to speak within next several weeks if he/or have not heard back about [employer] pt assistance.

## 2019-02-16 ENCOUNTER — Telehealth: Payer: Self-pay | Admitting: *Deleted

## 2019-02-16 NOTE — Telephone Encounter (Signed)
Called pt informing him that office receive denial from [place] on [employer].  Informed pt that according to company paperwork was sent prior to 02/06/19 and this was incorrect, it should have been sent after 02/06/19. Pt aware I was re-faxing today.

## 2019-02-23 ENCOUNTER — Telehealth: Payer: Self-pay | Admitting: Cardiology

## 2019-02-23 NOTE — Telephone Encounter (Signed)
Patient is returning phone call stating he has enough medication to last him through the end of the month and a couple of days after that.

## 2019-02-23 NOTE — Telephone Encounter (Signed)
Lm informing pt that it was re-faxed last Monday - would expect something this week from [place].  Advised to call back and let me know how much supply he still has at home.

## 2019-02-23 NOTE — Telephone Encounter (Signed)
Per call from patient asked to speak to [facility]. Still no information from the manufacture of Shawn Hernandez.   Please give him a call with any questions.

## 2019-02-25 NOTE — Telephone Encounter (Signed)
Spoke to [employer] pt connection -- they informed me ICD 10 code was missing from paperwork and they faxed request to our office.  Informed that I have not been in the office since last week and found paperwork in my "box". ICD code filled in and faxed back to [place].

## 2019-02-25 NOTE — Telephone Encounter (Signed)
Pt made aware that I still haven't heard anything. Aware we will look into this. Aware that I have samples held incase more is needed while we await answer from [place]. We agreed to follow up beginning of next week to see if he needs to get more samples. Patient verbalized understanding and agreeable to plan.

## 2019-03-02 ENCOUNTER — Telehealth: Payer: Self-pay | Admitting: Cardiology

## 2019-03-02 MED ORDER — DRONEDARONE HCL 400 MG PO TABS: 400.0000 mg | ORAL_TABLET | Freq: Two times a day (BID) | ORAL | 1 refills | Status: DC

## 2019-03-02 NOTE — Telephone Encounter (Signed)
Returned pt call. Pt reports that he spoke with [employer] who told him he did not qualify for reduced cost at this time, and that currently he will pay $30/month.  He can reapply when he reaches the doughnut hole. Refills sent in to pharmacy per pt request. He will let me know if/when he needs further assist w/ this.

## 2019-03-02 NOTE — Telephone Encounter (Signed)
Patient calling to speak to [facility], he states he has some more information he needs to tell her. Would not explain more than this.

## 2019-03-05 NOTE — Telephone Encounter (Addendum)
**Note De-Identified [employer] Obfuscation** Letter received from [employer] stating that they have denied the pt asst with his Eliquis. Reason for denial: Eliquis is covered by the pts INS plan. Application [id number]  The letter states that they have notified the pt of this denial as well.

## 2019-03-18 DIAGNOSIS — I2510 Atherosclerotic heart disease of native coronary artery without angina pectoris: Secondary | ICD-10-CM | POA: Diagnosis not present

## 2019-03-18 DIAGNOSIS — E782 Mixed hyperlipidemia: Secondary | ICD-10-CM | POA: Diagnosis not present

## 2019-03-18 DIAGNOSIS — E1165 Type 2 diabetes mellitus with hyperglycemia: Secondary | ICD-10-CM | POA: Diagnosis not present

## 2019-03-25 DIAGNOSIS — N1831 Chronic kidney disease, stage 3a: Secondary | ICD-10-CM | POA: Diagnosis not present

## 2019-03-25 DIAGNOSIS — E1165 Type 2 diabetes mellitus with hyperglycemia: Secondary | ICD-10-CM | POA: Diagnosis not present

## 2019-03-25 DIAGNOSIS — I2510 Atherosclerotic heart disease of native coronary artery without angina pectoris: Secondary | ICD-10-CM | POA: Diagnosis not present

## 2019-03-25 DIAGNOSIS — E782 Mixed hyperlipidemia: Secondary | ICD-10-CM | POA: Diagnosis not present

## 2019-03-25 DIAGNOSIS — I129 Hypertensive chronic kidney disease with stage 1 through stage 4 chronic kidney disease, or unspecified chronic kidney disease: Secondary | ICD-10-CM | POA: Diagnosis not present

## 2019-03-25 DIAGNOSIS — E1169 Type 2 diabetes mellitus with other specified complication: Secondary | ICD-10-CM | POA: Diagnosis not present

## 2019-05-19 ENCOUNTER — Other Ambulatory Visit: Payer: Self-pay | Admitting: Cardiology

## 2019-05-19 MED ORDER — APIXABAN 5 MG PO TABS: 5.0000 mg | ORAL_TABLET | Freq: Two times a day (BID) | ORAL | 5 refills | Status: DC

## 2019-05-19 NOTE — Telephone Encounter (Signed)
Eliquis mg refill request received. Patient is 69 years old, weight-110.2kg, Crea-1.53 on 08/20/2018, Diagnosis-Aflutter, and last seen by Coletta Memos NP on 10/21/2018. Dose is appropriate based on dosing criteria. Will send in refill to requested pharmacy.

## 2019-05-19 NOTE — Telephone Encounter (Signed)
*  STAT* If patient is at the pharmacy, call can be transferred to refill team.   1. Which medications need to be refilled? (please list name of each medication and dose if known) apixaban (ELIQUIS) 5 MG TABS tablet  2. Which pharmacy/location (including street and city if local pharmacy) is medication to be sent to? [id number] - [place], [place] [address]  3. Do they need a 30 day or 90 day supply? 90 day

## 2019-07-07 DIAGNOSIS — E119 Type 2 diabetes mellitus without complications: Secondary | ICD-10-CM | POA: Diagnosis not present

## 2019-07-14 DIAGNOSIS — E782 Mixed hyperlipidemia: Secondary | ICD-10-CM | POA: Diagnosis not present

## 2019-07-14 DIAGNOSIS — I129 Hypertensive chronic kidney disease with stage 1 through stage 4 chronic kidney disease, or unspecified chronic kidney disease: Secondary | ICD-10-CM | POA: Diagnosis not present

## 2019-07-14 DIAGNOSIS — E1165 Type 2 diabetes mellitus with hyperglycemia: Secondary | ICD-10-CM | POA: Diagnosis not present

## 2019-07-14 DIAGNOSIS — I2510 Atherosclerotic heart disease of native coronary artery without angina pectoris: Secondary | ICD-10-CM | POA: Diagnosis not present

## 2019-07-21 DIAGNOSIS — L4050 Arthropathic psoriasis, unspecified: Secondary | ICD-10-CM | POA: Diagnosis not present

## 2019-07-21 DIAGNOSIS — I4892 Unspecified atrial flutter: Secondary | ICD-10-CM | POA: Diagnosis not present

## 2019-07-21 DIAGNOSIS — I2510 Atherosclerotic heart disease of native coronary artery without angina pectoris: Secondary | ICD-10-CM | POA: Diagnosis not present

## 2019-07-21 DIAGNOSIS — R319 Hematuria, unspecified: Secondary | ICD-10-CM | POA: Diagnosis not present

## 2019-07-21 DIAGNOSIS — E1165 Type 2 diabetes mellitus with hyperglycemia: Secondary | ICD-10-CM | POA: Diagnosis not present

## 2019-07-21 DIAGNOSIS — E538 Deficiency of other specified B group vitamins: Secondary | ICD-10-CM | POA: Diagnosis not present

## 2019-07-21 DIAGNOSIS — I129 Hypertensive chronic kidney disease with stage 1 through stage 4 chronic kidney disease, or unspecified chronic kidney disease: Secondary | ICD-10-CM | POA: Diagnosis not present

## 2019-07-21 DIAGNOSIS — I700 Atherosclerosis of aorta: Secondary | ICD-10-CM | POA: Diagnosis not present

## 2019-07-21 DIAGNOSIS — E039 Hypothyroidism, unspecified: Secondary | ICD-10-CM | POA: Diagnosis not present

## 2019-07-21 DIAGNOSIS — Z0000 Encounter for general adult medical examination without abnormal findings: Secondary | ICD-10-CM | POA: Diagnosis not present

## 2019-07-21 DIAGNOSIS — I10 Essential (primary) hypertension: Secondary | ICD-10-CM | POA: Diagnosis not present

## 2019-07-21 DIAGNOSIS — E782 Mixed hyperlipidemia: Secondary | ICD-10-CM | POA: Diagnosis not present

## 2019-07-31 ENCOUNTER — Other Ambulatory Visit: Payer: Self-pay | Admitting: Physician Assistant

## 2019-08-16 ENCOUNTER — Emergency Department (HOSPITAL_COMMUNITY): Payer: PPO

## 2019-08-16 ENCOUNTER — Inpatient Hospital Stay (HOSPITAL_COMMUNITY)
Admission: EM | Admit: 2019-08-16 | Discharge: 2019-08-20 | DRG: 392 | Disposition: A | Discharge status: Home / Self Care | Payer: PPO | Attending: Internal Medicine | Admitting: Internal Medicine

## 2019-08-16 ENCOUNTER — Encounter (HOSPITAL_COMMUNITY): Payer: Self-pay | Admitting: Emergency Medicine

## 2019-08-16 ENCOUNTER — Other Ambulatory Visit: Payer: Self-pay

## 2019-08-16 DIAGNOSIS — K5792 Diverticulitis of intestine, part unspecified, without perforation or abscess without bleeding: Secondary | ICD-10-CM | POA: Diagnosis not present

## 2019-08-16 DIAGNOSIS — E7800 Pure hypercholesterolemia, unspecified: Secondary | ICD-10-CM | POA: Diagnosis present

## 2019-08-16 DIAGNOSIS — Z20822 Contact with and (suspected) exposure to covid-19: Secondary | ICD-10-CM | POA: Diagnosis present

## 2019-08-16 DIAGNOSIS — R1032 Left lower quadrant pain: Secondary | ICD-10-CM | POA: Diagnosis not present

## 2019-08-16 DIAGNOSIS — Z955 Presence of coronary angioplasty implant and graft: Secondary | ICD-10-CM

## 2019-08-16 DIAGNOSIS — Z7901 Long term (current) use of anticoagulants: Secondary | ICD-10-CM | POA: Diagnosis not present

## 2019-08-16 DIAGNOSIS — E1165 Type 2 diabetes mellitus with hyperglycemia: Secondary | ICD-10-CM | POA: Diagnosis not present

## 2019-08-16 DIAGNOSIS — K5732 Diverticulitis of large intestine without perforation or abscess without bleeding: Secondary | ICD-10-CM | POA: Diagnosis not present

## 2019-08-16 DIAGNOSIS — D631 Anemia in chronic kidney disease: Secondary | ICD-10-CM | POA: Diagnosis present

## 2019-08-16 DIAGNOSIS — Z9049 Acquired absence of other specified parts of digestive tract: Secondary | ICD-10-CM

## 2019-08-16 DIAGNOSIS — L4050 Arthropathic psoriasis, unspecified: Secondary | ICD-10-CM | POA: Diagnosis present

## 2019-08-16 DIAGNOSIS — E1159 Type 2 diabetes mellitus with other circulatory complications: Secondary | ICD-10-CM | POA: Diagnosis present

## 2019-08-16 DIAGNOSIS — Z833 Family history of diabetes mellitus: Secondary | ICD-10-CM

## 2019-08-16 DIAGNOSIS — E1122 Type 2 diabetes mellitus with diabetic chronic kidney disease: Secondary | ICD-10-CM | POA: Diagnosis not present

## 2019-08-16 DIAGNOSIS — I4892 Unspecified atrial flutter: Secondary | ICD-10-CM | POA: Diagnosis not present

## 2019-08-16 DIAGNOSIS — E1169 Type 2 diabetes mellitus with other specified complication: Secondary | ICD-10-CM | POA: Diagnosis present

## 2019-08-16 DIAGNOSIS — Z8249 Family history of ischemic heart disease and other diseases of the circulatory system: Secondary | ICD-10-CM | POA: Diagnosis not present

## 2019-08-16 DIAGNOSIS — K5730 Diverticulosis of large intestine without perforation or abscess without bleeding: Secondary | ICD-10-CM | POA: Diagnosis not present

## 2019-08-16 DIAGNOSIS — Z79899 Other long term (current) drug therapy: Secondary | ICD-10-CM | POA: Diagnosis not present

## 2019-08-16 DIAGNOSIS — Z03818 Encounter for observation for suspected exposure to other biological agents ruled out: Secondary | ICD-10-CM | POA: Diagnosis not present

## 2019-08-16 DIAGNOSIS — Z888 Allergy status to other drugs, medicaments and biological substances status: Secondary | ICD-10-CM

## 2019-08-16 DIAGNOSIS — M1990 Unspecified osteoarthritis, unspecified site: Secondary | ICD-10-CM | POA: Diagnosis not present

## 2019-08-16 DIAGNOSIS — I129 Hypertensive chronic kidney disease with stage 1 through stage 4 chronic kidney disease, or unspecified chronic kidney disease: Secondary | ICD-10-CM | POA: Diagnosis not present

## 2019-08-16 DIAGNOSIS — N1832 Chronic kidney disease, stage 3b: Secondary | ICD-10-CM | POA: Diagnosis not present

## 2019-08-16 DIAGNOSIS — I10 Essential (primary) hypertension: Secondary | ICD-10-CM | POA: Diagnosis not present

## 2019-08-16 DIAGNOSIS — Z7984 Long term (current) use of oral hypoglycemic drugs: Secondary | ICD-10-CM | POA: Diagnosis not present

## 2019-08-16 DIAGNOSIS — I2510 Atherosclerotic heart disease of native coronary artery without angina pectoris: Secondary | ICD-10-CM | POA: Diagnosis present

## 2019-08-16 DIAGNOSIS — N183 Chronic kidney disease, stage 3 unspecified: Secondary | ICD-10-CM

## 2019-08-16 DIAGNOSIS — N202 Calculus of kidney with calculus of ureter: Secondary | ICD-10-CM | POA: Diagnosis not present

## 2019-08-16 DIAGNOSIS — I480 Paroxysmal atrial fibrillation: Secondary | ICD-10-CM | POA: Diagnosis present

## 2019-08-16 DIAGNOSIS — K5712 Diverticulitis of small intestine without perforation or abscess without bleeding: Principal | ICD-10-CM | POA: Diagnosis present

## 2019-08-16 DIAGNOSIS — Z91041 Radiographic dye allergy status: Secondary | ICD-10-CM

## 2019-08-16 DIAGNOSIS — E785 Hyperlipidemia, unspecified: Secondary | ICD-10-CM | POA: Diagnosis present

## 2019-08-16 DIAGNOSIS — E119 Type 2 diabetes mellitus without complications: Secondary | ICD-10-CM | POA: Diagnosis not present

## 2019-08-16 DIAGNOSIS — K6389 Other specified diseases of intestine: Secondary | ICD-10-CM | POA: Diagnosis not present

## 2019-08-16 DIAGNOSIS — I700 Atherosclerosis of aorta: Secondary | ICD-10-CM | POA: Diagnosis not present

## 2019-08-16 DIAGNOSIS — I152 Hypertension secondary to endocrine disorders: Secondary | ICD-10-CM | POA: Diagnosis present

## 2019-08-16 LAB — URINALYSIS, ROUTINE W REFLEX MICROSCOPIC
Bilirubin Urine: NEGATIVE
Glucose, UA: NEGATIVE mg/dL
Hgb urine dipstick: NEGATIVE
Ketones, ur: NEGATIVE mg/dL
Leukocytes,Ua: NEGATIVE
Nitrite: NEGATIVE
Protein, ur: NEGATIVE mg/dL
Specific Gravity, Urine: 1.017 (ref 1.005–1.030)
pH: 5 (ref 5.0–8.0)

## 2019-08-16 LAB — CBC
HCT: 43.9 % (ref 39.0–52.0)
Hemoglobin: 15 g/dL (ref 13.0–17.0)
MCH: 31.3 pg (ref 26.0–34.0)
MCHC: 34.2 g/dL (ref 30.0–36.0)
MCV: 91.5 fL (ref 80.0–100.0)
Platelets: 322 10*3/uL (ref 150–400)
RBC: 4.8 MIL/uL (ref 4.22–5.81)
RDW: 12.3 % (ref 11.5–15.5)
WBC: 19.3 10*3/uL — ABNORMAL HIGH (ref 4.0–10.5)
nRBC: 0 % (ref 0.0–0.2)

## 2019-08-16 LAB — COMPREHENSIVE METABOLIC PANEL
ALT: 30 U/L (ref 0–44)
AST: 31 U/L (ref 15–41)
Albumin: 4.2 g/dL (ref 3.5–5.0)
Alkaline Phosphatase: 51 U/L (ref 38–126)
Anion gap: 11 (ref 5–15)
BUN: 15 mg/dL (ref 8–23)
CO2: 23 mmol/L (ref 22–32)
Calcium: 9.4 mg/dL (ref 8.9–10.3)
Chloride: 102 mmol/L (ref 98–111)
Creatinine, Ser: 1.73 mg/dL — ABNORMAL HIGH (ref 0.61–1.24)
GFR calc Af Amer: 45 mL/min — ABNORMAL LOW (ref >60)
GFR calc non Af Amer: 39 mL/min — ABNORMAL LOW (ref >60)
Glucose, Bld: 155 mg/dL — ABNORMAL HIGH (ref 70–99)
Potassium: 4.3 mmol/L (ref 3.5–5.1)
Sodium: 136 mmol/L (ref 135–145)
Total Bilirubin: 1.2 mg/dL (ref 0.3–1.2)
Total Protein: 6.9 g/dL (ref 6.5–8.1)

## 2019-08-16 LAB — LIPASE, BLOOD: Lipase: 58 U/L — ABNORMAL HIGH (ref 11–51)

## 2019-08-16 MED ORDER — SODIUM CHLORIDE 0.9% FLUSH: 3.0000 mL | Freq: Once | INTRAVENOUS | Status: DC

## 2019-08-16 MED ORDER — METRONIDAZOLE IN NACL 5-0.79 MG/ML-% IV SOLN
500.0000 mg | Freq: Once | INTRAVENOUS | Status: AC
  Administered 2019-08-17: 500 mg via INTRAVENOUS
  Filled 2019-08-16: qty 100

## 2019-08-16 MED ORDER — CIPROFLOXACIN IN D5W 400 MG/200ML IV SOLN
400.0000 mg | Freq: Two times a day (BID) | INTRAVENOUS | Status: DC
  Administered 2019-08-17 – 2019-08-19 (×7): 400 mg via INTRAVENOUS
  Filled 2019-08-16 (×8): qty 200

## 2019-08-16 NOTE — ED Provider Notes (Signed)
[facility] EMERGENCY DEPARTMENT Provider Note   CSN: [id number] Arrival date & time: 08/16/19  1612     History Chief Complaint  Patient presents with   Abdominal Pain    Shawn Hernandez is a 69 y.o. male.  The history is provided by the patient. No language interpreter was used.  Abdominal Pain Pain location:  LLQ Pain quality: aching and bloating   Pain radiates to:  Does not radiate Pain severity:  Moderate Onset quality:  Gradual Duration:  3 days Timing:  Constant Progression:  Worsening Chronicity:  New Relieved by:  Nothing Worsened by:  Nothing Ineffective treatments:  None tried Associated symptoms: no fever   Pt had a colon resection over 20 years ago second to diverticulitis.  Pt reports he began having pain 3 days ago.  Pt is worse today.       Past Medical History:  Diagnosis Date   Allergy to iodine    Arthritis    discomfort in hands and arms   Atrial flutter, paroxysmal ([place])    Coronary artery disease    Diabetes type 2, controlled ([place]) 10/2016   Diverticulosis    Hypercholesterolemia    Hypertension    Psoriatic arthritis ([place])    Sinusitis     Patient Active Problem List   Diagnosis Date Noted   Statin myopathy 02/04/2019   Diabetes ([place]) 10/21/2016   DKA (diabetic ketoacidoses) ([place]) 10/19/2016   Nausea and vomiting 10/19/2016   Acute kidney injury ([place]) 10/19/2016   Hyponatremia 10/19/2016   Coronary artery disease    Hypertension    Hypercholesterolemia    Atrial flutter, paroxysmal ([facility])    Sinusitis    Diverticulosis    Arthritis     Past Surgical History:  Procedure Laterality Date   A-FLUTTER ABLATION N/A 10/17/2017   Procedure: A-FLUTTER ABLATION;  Surgeon: Constance Haw, MD;  Location: [place] [facility];  Service: Cardiovascular;  Laterality: N/A;   COLON SURGERY  2001   2 feet removed re- diverticulosis   CORONARY STENT PLACEMENT  2011   sequential bare-metal     INGUINAL HERNIA REPAIR     OTHER SURGICAL HISTORY     appendectomy       Family History  Problem Relation Age of Onset   Heart disease Mother    Heart disease Father    Heart disease Brother    Diabetes Brother     Social History   Tobacco Use   Smoking status: Never Smoker   Smokeless tobacco: Never Used  Substance Use Topics   Alcohol use: Not on file   Drug use: Not on file    Home Medications Prior to Admission medications   Medication Sig Start Date End Date Taking? Authorizing Provider  apixaban (ELIQUIS) 5 MG TABS tablet Take 1 tablet (5 mg total) by mouth 2 (two) times daily. 05/19/19   Camnitz, Ocie Doyne, MD  blood glucose meter kit and supplies Dispense based on patient and insurance preference. Use up to four times daily as directed. (FOR ICD-10 E11.10). 10/21/16   Murlean Iba, MD  dronedarone (MULTAQ) 400 MG tablet Take 1 tablet (400 mg total) by mouth 2 (two) times daily with a meal. 03/02/19   Camnitz, Ocie Doyne, MD  ezetimibe (ZETIA) 10 MG tablet TAKE 1 TABLET BY MOUTH EVERY DAY 07/31/19   Barrett, Evelene Croon, PA-C  fenofibrate (TRICOR) 145 MG tablet TAKE 1 TABLET BY MOUTH EVERY DAY 07/31/19   Barrett, Evelene Croon,  PA-C  Magnesium 400 MG TABS Take 1 tablet by mouth every morning.    [provider]  metFORMIN (GLUCOPHAGE XR) 500 MG 24 hr tablet Take 1 tablet (500 mg total) by mouth 2 (two) times daily with a meal. 10/21/16   Johnson, Clanford L, MD  olmesartan (BENICAR) 20 MG tablet Take 20 mg by mouth daily.    [provider]  TRULICITY 7.41 OI/7.8MV SOPN Inject 0.75 mg into the skin once a week. Friday 08/27/17   [provider]    Allergies    Ivp dye [iodinated diagnostic agents], Lisinopril, Statins, and Iodine  Review of Systems   Review of Systems  Constitutional: Negative for fever.  Gastrointestinal: Positive for abdominal pain.  All other systems reviewed and are negative.   Physical Exam Updated Vital  Signs BP (!) 142/92 (BP Location: Right Arm)    Pulse 85    Temp 98.4 F (36.9 C) (Oral)    Resp 18    Ht 6' (1.829 m)    Wt 111.1 kg    SpO2 98%    BMI 33.23 kg/m   Physical Exam Vitals and nursing note reviewed.  Constitutional:      Appearance: He is well-developed.  HENT:     Head: Normocephalic.  Cardiovascular:     Rate and Rhythm: Normal rate and regular rhythm.  Pulmonary:     Effort: Pulmonary effort is normal.  Abdominal:     General: There is no distension.     Palpations: Abdomen is soft.     Tenderness: There is abdominal tenderness in the left lower quadrant.     Hernia: There is no hernia in the umbilical area.  Musculoskeletal:        General: Normal range of motion.     Cervical back: Normal range of motion.  Skin:    General: Skin is warm.  Neurological:     General: No focal deficit present.     Mental Status: He is alert and oriented to person, place, and time.     ED Results / Procedures / Treatments   Labs (all labs ordered are listed, but only abnormal results are displayed) Labs Reviewed  LIPASE, BLOOD - Abnormal; Notable for the following components:      Result Value   Lipase 58 (*)    All other components within normal limits  COMPREHENSIVE METABOLIC PANEL - Abnormal; Notable for the following components:   Glucose, Bld 155 (*)    Creatinine, Ser 1.73 (*)    GFR calc non Af Amer 39 (*)    GFR calc Af Amer 45 (*)    All other components within normal limits  CBC - Abnormal; Notable for the following components:   WBC 19.3 (*)    All other components within normal limits  URINALYSIS, ROUTINE W REFLEX MICROSCOPIC    EKG None  Radiology CT ABDOMEN PELVIS WO CONTRAST  Result Date: 08/16/2019 CLINICAL DATA:  Left lower quadrant abdominal pain. EXAM: CT ABDOMEN AND PELVIS WITHOUT CONTRAST TECHNIQUE: Multidetector CT imaging of the abdomen and pelvis was performed following the standard protocol without IV contrast. COMPARISON:  April 26, 2017. FINDINGS: Lower chest: The lung bases are clear. The heart size is normal. Hepatobiliary: There is decreased hepatic attenuation suggestive of hepatic steatosis. Normal gallbladder.There is no biliary ductal dilation. Pancreas: Normal contours without ductal dilatation. No peripancreatic fluid collection. Spleen: Unremarkable. Adrenals/Urinary Tract: --Adrenal glands: Unremarkable. --Right kidney/ureter: Multiple nonobstructing stones are noted measuring up to  approximately 7 mm in the lower pole. --Left kidney/ureter: Multiple nonobstructing stones are noted measuring up to approximately 5 mm in the lower pole. --Urinary bladder: Unremarkable. Stomach/Bowel: --Stomach/Duodenum: There is a posterior gastric diverticulum. --Small bowel: There are inflammatory changes in the left lower quadrant surrounding multiple loops of small bowel. Many of the small bowel loops demonstrate prominent diverticula. There is extraluminal air without evidence for a large abscess. There is no bowel obstruction. --Colon: There is a rectosigmoid anastomosis that is patent. There is no evidence for colonic diverticulitis. --Appendix: Normal. Vascular/Lymphatic: Atherosclerotic calcification is present within the non-aneurysmal abdominal aorta, without hemodynamically significant stenosis. --No retroperitoneal lymphadenopathy. --No mesenteric lymphadenopathy. --No pelvic or inguinal lymphadenopathy. Reproductive: Unremarkable Other: No ascites or free air. The abdominal wall is normal. Musculoskeletal. No acute displaced fractures. IMPRESSION: 1. Inflammatory changes in the left lower quadrant surrounding multiple loops of small bowel. There is extraluminal air without evidence for a large abscess. Findings are concerning for acute complicated small-bowel diverticulitis. 2. Colonic diverticulosis without CT evidence for colonic diverticulitis. 3. Bilateral nonobstructing nephrolithiasis. 4. Hepatic steatosis. Aortic Atherosclerosis  (ICD10-I70.0). Electronically Signed   By: Constance Holster M.D.   On: 08/16/2019 22:10    Procedures Procedures (including critical care time)  Medications Ordered in ED Medications  sodium chloride flush (NS) 0.9 % injection 3 mL (has no administration in time range)    ED Course  I have reviewed the triage vital signs and the nursing notes.  Pertinent labs & imaging results that were available during my care of the patient were reviewed by me and considered in my medical decision making (see chart for details).    MDM Rules/Calculators/A&P                          MDM:  Pt has a small bowel diverticulitis.  Pt has elevated wbc count of 19. Pt started on cipro and flagyl IV.  I spoke with the Hospiotalist Dr. Posey Pronto who will admit. I spoke with Dr. Lonni Fix surgeon who will see and consult   Final Clinical Impression(s) / ED Diagnoses Final diagnoses:  Diverticulitis of small intestine without perforation or abscess without bleeding    Rx / DC Orders ED Discharge Orders    None       Sidney Ace 08/16/19 2356    Pattricia Boss, MD 08/17/19 1445

## 2019-08-16 NOTE — H&P (Signed)
History and Physical    [name] OMA:[id number] DOB: 07-05-1950 DOA: 08/16/2019  PCP: [name], MD  Patient coming from: Home  I have personally briefly reviewed patient's old medical records in [place]  Chief Complaint: LLQ abdominal pain  HPI: Shawn Hernandez is a 69 y.o. male with medical history significant for paroxysmal atrial fibrillation/flutter on Eliquis (s/p atrial flutter ablation 10/17/2017), CAD s/p BMS, T2DM, CKD stage III, HTN, HLD, psoriatic arthritis, and history of bowel resection 20 years ago due to diverticulitis who presents to the ED for evaluation of LLQ abdominal pain.  Patient reports new onset of left lower quadrant abdominal pain beginning around 11 PM on 08/15/2019.  He has had associated nausea without emesis and decreased appetite.  He denies any subjective fevers or chills but has been having night sweats.  He reports chronic on and off loose stools but no bowel movement in the last 24 hours due to decreased oral intake.  He he has been having good urine output without dysuria.  He denies any chest pain.  He has had intermittent palpitations due to his underlying arrhythmia but has not felt any palpitations over the last 2 days.  He reports occasional dyspnea when he is exerting himself such as doing yard work.  He denies any obvious bleeding.  He says he has a prior history of diverticulitis requiring large bowel resection 20 years ago.  ED Course:  Initial vitals showed BP 143/73, pulse 99, RR 16, temp 98.6 Fahrenheit, SPO2 95% on room air.  Labs notable for WBC 19.3, hemoglobin 15.0, platelets 322,000, sodium 136, potassium 4.3, bicarb 23, BUN 15, creatinine 1.73 (previously 1.53 on 08/20/2018), serum glucose 155, LFTs within normal limits, lipase 58.  Urinalysis is negative for UTI.  SARS-CoV-2 PCR is collected and pending.  CT abdomen/pelvis without contrast shows inflammatory changes in the left lower quadrant surrounding multiple  loops of small bowel with extraluminal air seen without evidence of large abscess triggers findings suggestive of acute complicated small bowel diverticulitis.  Colonic diverticulosis is seen without CT evidence of colonic diverticulitis.  Bilateral nonobstructing nephrolithiasis and hepatic steatosis also noted.  Patient was given IV Cipro/Flagyl.  EDP discussed with on-call surgery who will consult the patient.  The hospitalist service was consulted to admit for further evaluation and management.  Review of Systems: All systems reviewed and are negative except as documented in history of present illness above.   Past Medical History:  Diagnosis Date  . Allergy to iodine   . Arthritis    discomfort in hands and arms  . Atrial flutter, paroxysmal ([place])   . Coronary artery disease   . Diabetes type 2, controlled ([place]) 10/2016  . Diverticulosis   . Hypercholesterolemia   . Hypertension   . Psoriatic arthritis ([place])   . Sinusitis     Past Surgical History:  Procedure Laterality Date  . A-FLUTTER ABLATION N/A 10/17/2017   Procedure: A-FLUTTER ABLATION;  Surgeon: Constance Haw, MD;  Location: [facility];  Service: Cardiovascular;  Laterality: N/A;  . COLON SURGERY  2001   2 feet removed re- diverticulosis  . CORONARY STENT PLACEMENT  2011   sequential bare-metal  . INGUINAL HERNIA REPAIR    . OTHER SURGICAL HISTORY     appendectomy    Social History:  reports that he has never smoked. He has never used smokeless tobacco. No history on file for alcohol use and drug use.  Allergies  Allergen Reactions  .  Ivp Dye [Iodinated Diagnostic Agents] Hives  . Lisinopril Other (See Comments)    cough  . Statins Other (See Comments)    myalgias  . Iodine Rash    Family History  Problem Relation Age of Onset  . Heart disease Mother   . Heart disease Father   . Heart disease Brother   . Diabetes Brother      Prior to Admission medications   Medication Sig Start  Date End Date Taking? Authorizing Provider  apixaban (ELIQUIS) 5 MG TABS tablet Take 1 tablet (5 mg total) by mouth 2 (two) times daily. 05/19/19   Camnitz, Will Martin, MD  blood glucose meter kit and supplies Dispense based on patient and insurance preference. Use up to four times daily as directed. (FOR ICD-10 E11.10). 10/21/16   Johnson, Clanford L, MD  dronedarone (MULTAQ) 400 MG tablet Take 1 tablet (400 mg total) by mouth 2 (two) times daily with a meal. 03/02/19   Camnitz, Will Martin, MD  ezetimibe (ZETIA) 10 MG tablet TAKE 1 TABLET BY MOUTH EVERY DAY 07/31/19   Barrett, Rhonda G, PA-C  fenofibrate (TRICOR) 145 MG tablet TAKE 1 TABLET BY MOUTH EVERY DAY 07/31/19   Barrett, Rhonda G, PA-C  Magnesium 400 MG TABS Take 1 tablet by mouth every morning.    [provider]  metFORMIN (GLUCOPHAGE XR) 500 MG 24 hr tablet Take 1 tablet (500 mg total) by mouth 2 (two) times daily with a meal. 10/21/16   Johnson, Clanford L, MD  olmesartan (BENICAR) 20 MG tablet Take 20 mg by mouth daily.    [provider]  TRULICITY 0.75 MG/0.5ML SOPN Inject 0.75 mg into the skin once a week. Friday 08/27/17   [provider]    Physical Exam: Vitals:   08/16/19 1952 08/16/19 2137 08/16/19 2140 08/16/19 2338  BP: 123/70 (!) 142/92  124/76  Pulse: 82 85  79  Resp: 18 18  18  Temp: 98.4 F (36.9 C)     TempSrc: Oral     SpO2: 100% 98%  96%  Weight:   111.1 kg   Height:   6' (1.829 m)    Constitutional: NAD, calm, comfortable Eyes: PERRL, lids and conjunctivae normal ENMT: Mucous membranes are moist. Posterior pharynx clear of any exudate or lesions.Normal dentition.  Neck: normal, supple, no masses. Respiratory: clear to auscultation bilaterally, no wheezing, no crackles. Normal respiratory effort. No accessory muscle use.  Cardiovascular: Regular rate and rhythm, no murmurs / rubs / gallops. No extremity edema. 2+ pedal pulses. Abdomen: Mild LLQ tenderness to palpation, no masses  palpated. No hepatosplenomegaly. Bowel sounds positive.  Musculoskeletal: no clubbing / cyanosis. No joint deformity upper and lower extremities. Good ROM, no contractures. Normal muscle tone.  Skin: no rashes, lesions, ulcers. No induration Neurologic: CN 2-12 grossly intact. Sensation intact, Strength 5/5 in all 4.  Psychiatric: Normal judgment and insight. Alert and oriented x 3. Normal mood.   Labs on Admission: I have personally reviewed following labs and imaging studies  CBC: Recent Labs  Lab 08/16/19 1626  WBC 19.3*  HGB 15.0  HCT 43.9  MCV 91.5  PLT 322   Basic Metabolic Panel: Recent Labs  Lab 08/16/19 1626  NA 136  K 4.3  CL 102  CO2 23  GLUCOSE 155*  BUN 15  CREATININE 1.73*  CALCIUM 9.4   GFR: Estimated Creatinine Clearance: 51.1 mL/min (A) (by C-G formula based on SCr of 1.73 mg/dL (H)). Liver Function Tests: Recent Labs    Lab 08/16/19 1626  AST 31  ALT 30  ALKPHOS 51  BILITOT 1.2  PROT 6.9  ALBUMIN 4.2   Recent Labs  Lab 08/16/19 1626  LIPASE 58*   No results for input(s): AMMONIA in the last 168 hours. Coagulation Profile: No results for input(s): INR, PROTIME in the last 168 hours. Cardiac Enzymes: No results for input(s): CKTOTAL, CKMB, CKMBINDEX, TROPONINI in the last 168 hours. BNP (last 3 results) No results for input(s): PROBNP in the last 8760 hours. HbA1C: No results for input(s): HGBA1C in the last 72 hours. CBG: No results for input(s): GLUCAP in the last 168 hours. Lipid Profile: No results for input(s): CHOL, HDL, LDLCALC, TRIG, CHOLHDL, LDLDIRECT in the last 72 hours. Thyroid Function Tests: No results for input(s): TSH, T4TOTAL, FREET4, T3FREE, THYROIDAB in the last 72 hours. Anemia Panel: No results for input(s): VITAMINB12, FOLATE, FERRITIN, TIBC, IRON, RETICCTPCT in the last 72 hours. Urine analysis:    Component Value Date/Time   COLORURINE YELLOW 08/16/2019 2204   APPEARANCEUR CLEAR 08/16/2019 2204   LABSPEC  1.017 08/16/2019 2204   PHURINE 5.0 08/16/2019 2204   GLUCOSEU NEGATIVE 08/16/2019 2204   HGBUR NEGATIVE 08/16/2019 2204   BILIRUBINUR NEGATIVE 08/16/2019 2204   KETONESUR NEGATIVE 08/16/2019 2204   PROTEINUR NEGATIVE 08/16/2019 2204   NITRITE NEGATIVE 08/16/2019 2204   LEUKOCYTESUR NEGATIVE 08/16/2019 2204    Radiological Exams on Admission: CT ABDOMEN PELVIS WO CONTRAST  Result Date: 08/16/2019 CLINICAL DATA:  Left lower quadrant abdominal pain. EXAM: CT ABDOMEN AND PELVIS WITHOUT CONTRAST TECHNIQUE: Multidetector CT imaging of the abdomen and pelvis was performed following the standard protocol without IV contrast. COMPARISON:  April 26, 2017. FINDINGS: Lower chest: The lung bases are clear. The heart size is normal. Hepatobiliary: There is decreased hepatic attenuation suggestive of hepatic steatosis. Normal gallbladder.There is no biliary ductal dilation. Pancreas: Normal contours without ductal dilatation. No peripancreatic fluid collection. Spleen: Unremarkable. Adrenals/Urinary Tract: --Adrenal glands: Unremarkable. --Right kidney/ureter: Multiple nonobstructing stones are noted measuring up to approximately 7 mm in the lower pole. --Left kidney/ureter: Multiple nonobstructing stones are noted measuring up to approximately 5 mm in the lower pole. --Urinary bladder: Unremarkable. Stomach/Bowel: --Stomach/Duodenum: There is a posterior gastric diverticulum. --Small bowel: There are inflammatory changes in the left lower quadrant surrounding multiple loops of small bowel. Many of the small bowel loops demonstrate prominent diverticula. There is extraluminal air without evidence for a large abscess. There is no bowel obstruction. --Colon: There is a rectosigmoid anastomosis that is patent. There is no evidence for colonic diverticulitis. --Appendix: Normal. Vascular/Lymphatic: Atherosclerotic calcification is present within the non-aneurysmal abdominal aorta, without hemodynamically significant  stenosis. --No retroperitoneal lymphadenopathy. --No mesenteric lymphadenopathy. --No pelvic or inguinal lymphadenopathy. Reproductive: Unremarkable Other: No ascites or free air. The abdominal wall is normal. Musculoskeletal. No acute displaced fractures. IMPRESSION: 1. Inflammatory changes in the left lower quadrant surrounding multiple loops of small bowel. There is extraluminal air without evidence for a large abscess. Findings are concerning for acute complicated small-bowel diverticulitis. 2. Colonic diverticulosis without CT evidence for colonic diverticulitis. 3. Bilateral nonobstructing nephrolithiasis. 4. Hepatic steatosis. Aortic Atherosclerosis (ICD10-I70.0). Electronically Signed   By: Christopher  Green M.D.   On: 08/16/2019 22:10    EKG: Independently reviewed. Sinus rhythm, early R wave transition, motion artifact without acute ischemic changes.  Not significantly changed when compared to prior.  Assessment/Plan Principal Problem:   Diverticulitis of small intestine with complication Active Problems:   Hyperlipidemia associated with type 2 diabetes mellitus (HCC)     Atrial flutter, paroxysmal (HCC)   Coronary artery disease   Hypertension associated with diabetes (HCC)   Type 2 diabetes mellitus without complication (HCC)   CKD (chronic kidney disease), stage III  Daivik D Egnor is a 69 y.o. male with medical history significant for paroxysmal atrial fibrillation/flutter on Eliquis (s/p atrial flutter ablation 10/17/2017), CAD s/p BMS, T2DM, CKD stage III, HTN, HLD, psoriatic arthritis, and history of bowel resection 20 years ago due to diverticulitis who is admitted with acute small bowel diverticulitis.  Acute complicated small bowel diverticulitis: Seen on CT imaging with findings of extraluminal air without evidence of large abscess.  Patient reports prior large bowel resection 20 years ago for management of diverticulitis. -Continue IV Cipro/Flagyl -Keep n.p.o. -Continue IV  fluid hydration overnight -General surgery consulted, appreciate further assistance -Will hold anticoagulation for now  Paroxysmal atrial flutter/fibrillation: S/p atrial flutter ablation 10/17/2017 by Dr. Camnitz.  Patient is in sinus rhythm with controlled rate on admission. -Holding home Eliquis for now in case he will need surgical intervention -Continue home dronedarone and magnesium  CKD stage III: Has had progression of CKD over the last 2 years.  Bilateral nonobstructing nephrolithiasis noted on CT imaging.  Will continue IV fluid hydration overnight and repeat labs in the morning.  CAD s/p BMS: Denies any chest pain.  Holding Eliquis as above.  Continue Zetia.  Type 2 diabetes: Hold home Metformin and Trulicity while in hospital.  Placed on sensitive SSI q4h while NPO.  Hypertension: Currently normotensive, holding home olmesartan for now.  Hyperlipidemia: History of statin intolerance, currently managed with Zetia and fenofibrate.  DVT prophylaxis: SCDs Code Status: DNI, confirmed with patient Family Communication: Discussed with patient, he has discussed with family Disposition Plan: From home and likely discharge to home pending further management of small bowel diverticulitis Consults called: General surgery to consult Admission status:  Status is: Inpatient  Remains inpatient appropriate because:Ongoing active pain requiring inpatient pain management and IV treatments appropriate due to intensity of illness or inability to take PO   Dispo: The patient is from: Home              Anticipated d/c is to: Home              Anticipated d/c date is: 2 days pending improvement in acute complicated small bowel diverticulitis, general surgery evaluation recommendations.  Anticipate he will need at least 24-48 hours of IV antibiotics.              Patient currently is not medically stable to d/c.   Vishal Patel MD [facility]  If 7PM-7AM, please contact  night-coverage [email or web address]  08/16/2019, 11:52 PM    

## 2019-08-16 NOTE — ED Triage Notes (Signed)
Pt c/o LLQ pain with nausea and diarrhea that started last night. Hx diverticulitis.

## 2019-08-17 DIAGNOSIS — E1159 Type 2 diabetes mellitus with other circulatory complications: Secondary | ICD-10-CM

## 2019-08-17 DIAGNOSIS — E1169 Type 2 diabetes mellitus with other specified complication: Secondary | ICD-10-CM

## 2019-08-17 DIAGNOSIS — I2510 Atherosclerotic heart disease of native coronary artery without angina pectoris: Secondary | ICD-10-CM

## 2019-08-17 DIAGNOSIS — N1832 Chronic kidney disease, stage 3b: Secondary | ICD-10-CM

## 2019-08-17 DIAGNOSIS — E785 Hyperlipidemia, unspecified: Secondary | ICD-10-CM

## 2019-08-17 DIAGNOSIS — I10 Essential (primary) hypertension: Secondary | ICD-10-CM

## 2019-08-17 DIAGNOSIS — E119 Type 2 diabetes mellitus without complications: Secondary | ICD-10-CM

## 2019-08-17 DIAGNOSIS — I4892 Unspecified atrial flutter: Secondary | ICD-10-CM

## 2019-08-17 LAB — CBC
HCT: 38.4 % — ABNORMAL LOW (ref 39.0–52.0)
Hemoglobin: 13.2 g/dL (ref 13.0–17.0)
MCH: 31.4 pg (ref 26.0–34.0)
MCHC: 34.4 g/dL (ref 30.0–36.0)
MCV: 91.2 fL (ref 80.0–100.0)
Platelets: 268 10*3/uL (ref 150–400)
RBC: 4.21 MIL/uL — ABNORMAL LOW (ref 4.22–5.81)
RDW: 12.6 % (ref 11.5–15.5)
WBC: 13.7 10*3/uL — ABNORMAL HIGH (ref 4.0–10.5)
nRBC: 0 % (ref 0.0–0.2)

## 2019-08-17 LAB — CBG MONITORING, ED
Glucose-Capillary: 112 mg/dL — ABNORMAL HIGH (ref 70–99)
Glucose-Capillary: 122 mg/dL — ABNORMAL HIGH (ref 70–99)
Glucose-Capillary: 133 mg/dL — ABNORMAL HIGH (ref 70–99)
Glucose-Capillary: 189 mg/dL — ABNORMAL HIGH (ref 70–99)

## 2019-08-17 LAB — HEMOGLOBIN A1C
Hgb A1c MFr Bld: 7.6 % — ABNORMAL HIGH (ref 4.8–5.6)
Mean Plasma Glucose: 171.42 mg/dL

## 2019-08-17 LAB — BASIC METABOLIC PANEL
Anion gap: 10 (ref 5–15)
BUN: 16 mg/dL (ref 8–23)
CO2: 22 mmol/L (ref 22–32)
Calcium: 8.7 mg/dL — ABNORMAL LOW (ref 8.9–10.3)
Chloride: 105 mmol/L (ref 98–111)
Creatinine, Ser: 1.68 mg/dL — ABNORMAL HIGH (ref 0.61–1.24)
GFR calc Af Amer: 47 mL/min — ABNORMAL LOW (ref >60)
GFR calc non Af Amer: 41 mL/min — ABNORMAL LOW (ref >60)
Glucose, Bld: 130 mg/dL — ABNORMAL HIGH (ref 70–99)
Potassium: 3.5 mmol/L (ref 3.5–5.1)
Sodium: 137 mmol/L (ref 135–145)

## 2019-08-17 LAB — SARS CORONAVIRUS 2 BY RT PCR (HOSPITAL ORDER, PERFORMED IN ~~LOC~~ HOSPITAL LAB): SARS Coronavirus 2: NEGATIVE

## 2019-08-17 LAB — HIV ANTIBODY (ROUTINE TESTING W REFLEX): HIV Screen 4th Generation wRfx: NONREACTIVE

## 2019-08-17 LAB — GLUCOSE, CAPILLARY
Glucose-Capillary: 132 mg/dL — ABNORMAL HIGH (ref 70–99)
Glucose-Capillary: 92 mg/dL (ref 70–99)
Glucose-Capillary: 95 mg/dL (ref 70–99)

## 2019-08-17 MED ORDER — MAGNESIUM OXIDE 400 (241.3 MG) MG PO TABS
400.0000 mg | ORAL_TABLET | Freq: Every day | ORAL | Status: DC
  Administered 2019-08-17 – 2019-08-19 (×3): 400 mg via ORAL
  Filled 2019-08-17 (×3): qty 1

## 2019-08-17 MED ORDER — METRONIDAZOLE IN NACL 5-0.79 MG/ML-% IV SOLN
500.0000 mg | Freq: Three times a day (TID) | INTRAVENOUS | Status: DC
  Administered 2019-08-17 – 2019-08-20 (×10): 500 mg via INTRAVENOUS
  Filled 2019-08-17 (×10): qty 100

## 2019-08-17 MED ORDER — INSULIN ASPART 100 UNIT/ML ~~LOC~~ SOLN
0.0000 [IU] | SUBCUTANEOUS | Status: DC
  Administered 2019-08-17 (×2): 1 [IU] via SUBCUTANEOUS
  Administered 2019-08-17 – 2019-08-18 (×4): 2 [IU] via SUBCUTANEOUS
  Administered 2019-08-18 – 2019-08-19 (×2): 1 [IU] via SUBCUTANEOUS
  Administered 2019-08-19: 2 [IU] via SUBCUTANEOUS
  Administered 2019-08-20 (×2): 1 [IU] via SUBCUTANEOUS

## 2019-08-17 MED ORDER — SODIUM CHLORIDE 0.9 % IV SOLN: INTRAVENOUS | Status: DC

## 2019-08-17 MED ORDER — HYDROMORPHONE HCL 1 MG/ML IJ SOLN: 0.5000 mg | INTRAMUSCULAR | Status: DC | PRN

## 2019-08-17 MED ORDER — ACETAMINOPHEN 325 MG PO TABS: 650.0000 mg | ORAL_TABLET | Freq: Four times a day (QID) | ORAL | Status: DC | PRN

## 2019-08-17 MED ORDER — ACETAMINOPHEN 650 MG RE SUPP: 650.0000 mg | Freq: Four times a day (QID) | RECTAL | Status: DC | PRN

## 2019-08-17 MED ORDER — ONDANSETRON HCL 4 MG/2ML IJ SOLN: 4.0000 mg | Freq: Four times a day (QID) | INTRAMUSCULAR | Status: DC | PRN

## 2019-08-17 MED ORDER — DRONEDARONE HCL 400 MG PO TABS
400.0000 mg | ORAL_TABLET | Freq: Two times a day (BID) | ORAL | Status: DC
  Administered 2019-08-17 – 2019-08-20 (×7): 400 mg via ORAL
  Filled 2019-08-17 (×7): qty 1

## 2019-08-17 MED ORDER — MORPHINE SULFATE (PF) 2 MG/ML IV SOLN: 2.0000 mg | INTRAVENOUS | Status: DC | PRN

## 2019-08-17 MED ORDER — ONDANSETRON HCL 4 MG PO TABS: 4.0000 mg | ORAL_TABLET | Freq: Four times a day (QID) | ORAL | Status: DC | PRN

## 2019-08-17 MED ORDER — SODIUM CHLORIDE 0.9 % IV SOLN: INTRAVENOUS | Status: AC

## 2019-08-17 MED ORDER — EZETIMIBE 10 MG PO TABS
10.0000 mg | ORAL_TABLET | Freq: Every day | ORAL | Status: DC
  Administered 2019-08-17 – 2019-08-19 (×3): 10 mg via ORAL
  Filled 2019-08-17 (×3): qty 1

## 2019-08-17 NOTE — Plan of Care (Signed)
°  Problem: Education: Goal: Knowledge of General Education information will improve Description: Including pain rating scale, medication(s)/side effects and non-pharmacologic comfort measures Outcome: Progressing  Aeb pt verbalizes poc Problem: Health Behavior/Discharge Planning: Goal: Ability to manage health-related needs will improve Outcome: Progressing  Aeb pt participates in poc Problem: Activity: Goal: Risk for activity intolerance will decrease Outcome: Progressing  Aeb pt ambulating in room this evening

## 2019-08-17 NOTE — Progress Notes (Signed)
PROGRESS NOTE    Shawn Hernandez  CHY:[id number] DOB: May 04, 1950 DOA: 08/16/2019 PCP: [name], MD   Brief Narrative:   Shawn Hernandez is a 70 y.o. male with medical history significant for paroxysmal atrial fibrillation/flutter on Eliquis (s/p atrial flutter ablation 10/17/2017), CAD s/p BMS, T2DM, CKD stage III, HTN, HLD, psoriatic arthritis, and history of bowel resection 20 years ago due to diverticulitis who presents to the ED for evaluation of LLQ abdominal pain.  7/12: Continue bowel rest, fluids, abx. Appreciate general surgery assistance.  Assessment & Plan:   Principal Problem:   Diverticulitis of small intestine with complication Active Problems:   Hyperlipidemia associated with type 2 diabetes mellitus (HCC)   Atrial flutter, paroxysmal (HCC)   Coronary artery disease   Hypertension associated with diabetes ([place])   Type 2 diabetes mellitus without complication (HCC)   CKD (chronic kidney disease), stage III  Acute complicated small bowel diverticulitis:     - Seen on CT imaging with findings of extraluminal air without evidence of large abscess.     - fluids, bowel rest, cipro/flagyl IV     - appreciate general surgery assistance  Paroxysmal atrial flutter/fibrillation:     - s/p atrial flutter ablation 10/17/2017 by Dr. Curt Bears.      - Patient is in sinus rhythm with controlled rate on admission.     - continue to home Eliquis for now in case he will need surgical intervention     - dronedarone and magnesium  CKD stage IIIb:     - Bilateral nonobstructing nephrolithiasis noted on CT imaging.     - SCr baseline is 1.4-1.5     - SCr is 1.68 today, close to baseline, continuing fluids  CAD s/p BMS:     - Denies any chest pain.       - Holding Eliquis as above.       - continue zetia and fenofibrate  Type 2 diabetes, uncontrolled:     - Hold home Metformin and Trulicity while in hospital.     - Sensitive SSI q4h while NPO.     - A1c  7.6  Hypertension:     - BP ok; holding home olmesartan  Hyperlipidemia:     - continue zetia, fenofibrate  DVT prophylaxis: SCDs Code Status: DNI Family Communication: None at bedside   Status is: Inpatient  Remains inpatient appropriate because:Inpatient level of care appropriate due to severity of illness   Dispo: The patient is from: Home              Anticipated d/c is to: Home              Anticipated d/c date is: 2 days              Patient currently is not medically stable to d/c.  Consultants:   General Surgery  Antimicrobials:  . Flagyl, cipro   ROS:  Reports ab pain. Denies CP, N, V, dyspnea . Remainder ROS is negative for all not previously mentioned.  Subjective: "I had some problems with my diverticulitis before."  Objective: Vitals:   08/17/19 1430 08/17/19 1500 08/17/19 1518 08/17/19 1533  BP:   118/70 112/73  Pulse: 69 76  76  Resp: (!) 22 20  20   Temp:   98.6 F (37 C) 99.8 F (37.7 C)  TempSrc:   Oral Oral  SpO2: 95% 94%  94%  Weight:      Height:  Intake/Output Summary (Last 24 hours) at 08/17/2019 1538 Last data filed at 08/17/2019 1147 Gross per 24 hour  Intake 300 ml  Output --  Net 300 ml   Filed Weights   08/16/19 2140  Weight: 111.1 kg    Examination:  General: 69 y.o. male resting in bed in NAD Cardiovascular: RRR, +S1, S2, no m/g/r, equal pulses throughout Respiratory: CTABL, no w/r/r, normal WOB GI: BS+, ND, LLQ mild TTP MSK: No e/c/c Neuro: A&O x 3, no focal deficits Psyc: Appropriate interaction and affect, calm/cooperative   Data Reviewed: I have personally reviewed following labs and imaging studies.  CBC: Recent Labs  Lab 08/16/19 1626 08/17/19 0452  WBC 19.3* 13.7*  HGB 15.0 13.2  HCT 43.9 38.4*  MCV 91.5 91.2  PLT 322 941   Basic Metabolic Panel: Recent Labs  Lab 08/16/19 1626 08/17/19 0452  NA 136 137  K 4.3 3.5  CL 102 105  CO2 23 22  GLUCOSE 155* 130*  BUN 15 16  CREATININE  1.73* 1.68*  CALCIUM 9.4 8.7*   GFR: Estimated Creatinine Clearance: 52.7 mL/min (A) (by C-G formula based on SCr of 1.68 mg/dL (H)). Liver Function Tests: Recent Labs  Lab 08/16/19 1626  AST 31  ALT 30  ALKPHOS 51  BILITOT 1.2  PROT 6.9  ALBUMIN 4.2   Recent Labs  Lab 08/16/19 1626  LIPASE 58*   No results for input(s): AMMONIA in the last 168 hours. Coagulation Profile: No results for input(s): INR, PROTIME in the last 168 hours. Cardiac Enzymes: No results for input(s): CKTOTAL, CKMB, CKMBINDEX, TROPONINI in the last 168 hours. BNP (last 3 results) No results for input(s): PROBNP in the last 8760 hours. HbA1C: Recent Labs    08/17/19 0500  HGBA1C 7.6*   CBG: Recent Labs  Lab 08/17/19 0038 08/17/19 0423 08/17/19 0911 08/17/19 1429  GLUCAP 189* 122* 133* 112*   Lipid Profile: No results for input(s): CHOL, HDL, LDLCALC, TRIG, CHOLHDL, LDLDIRECT in the last 72 hours. Thyroid Function Tests: No results for input(s): TSH, T4TOTAL, FREET4, T3FREE, THYROIDAB in the last 72 hours. Anemia Panel: No results for input(s): VITAMINB12, FOLATE, FERRITIN, TIBC, IRON, RETICCTPCT in the last 72 hours. Sepsis Labs: No results for input(s): PROCALCITON, LATICACIDVEN in the last 168 hours.  Recent Results (from the past 240 hour(s))  SARS Coronavirus 2 by RT PCR (hospital order, performed in [facility] hospital lab) Nasopharyngeal Nasopharyngeal Swab     Status: None   Collection Time: 08/16/19 11:39 PM   Specimen: Nasopharyngeal Swab  Result Value Ref Range Status   SARS Coronavirus 2 NEGATIVE NEGATIVE Final    Comment: (NOTE) SARS-CoV-2 target nucleic acids are NOT DETECTED.  The SARS-CoV-2 RNA is generally detectable in upper and lower respiratory specimens during the acute phase of infection. The lowest concentration of SARS-CoV-2 viral copies this assay can detect is 250 copies / mL. A negative result does not preclude SARS-CoV-2 infection and should not be used  as the sole basis for treatment or other patient management decisions.  A negative result may occur with improper specimen collection / handling, submission of specimen other than nasopharyngeal swab, presence of viral mutation(s) within the areas targeted by this assay, and inadequate number of viral copies (<250 copies / mL). A negative result must be combined with clinical observations, patient history, and epidemiological information.  Fact Sheet for Patients:   StrictlyIdeas.no  Fact Sheet for Healthcare Providers: BankingDealers.co.za  This test is not yet approved or  cleared by  the [employer] and has been authorized for detection and/or diagnosis of SARS-CoV-2 by FDA under an Emergency Use Authorization (EUA).  This EUA will remain in effect (meaning this test can be used) for the duration of the COVID-19 declaration under Section 564(b)(1) of the Act, 21 U.S.C. section 360bbb-3(b)(1), unless the authorization is terminated or revoked sooner.  Performed at [facility], [place] [address], [place], [place] [address]       Radiology Studies: CT ABDOMEN PELVIS WO CONTRAST  Result Date: 08/16/2019 CLINICAL DATA:  Left lower quadrant abdominal pain. EXAM: CT ABDOMEN AND PELVIS WITHOUT CONTRAST TECHNIQUE: Multidetector CT imaging of the abdomen and pelvis was performed following the standard protocol without IV contrast. COMPARISON:  April 26, 2017. FINDINGS: Lower chest: The lung bases are clear. The heart size is normal. Hepatobiliary: There is decreased hepatic attenuation suggestive of hepatic steatosis. Normal gallbladder.There is no biliary ductal dilation. Pancreas: Normal contours without ductal dilatation. No peripancreatic fluid collection. Spleen: Unremarkable. Adrenals/Urinary Tract: --Adrenal glands: Unremarkable. --Right kidney/ureter: Multiple nonobstructing stones are noted measuring up to approximately 7 mm in  the lower pole. --Left kidney/ureter: Multiple nonobstructing stones are noted measuring up to approximately 5 mm in the lower pole. --Urinary bladder: Unremarkable. Stomach/Bowel: --Stomach/Duodenum: There is a posterior gastric diverticulum. --Small bowel: There are inflammatory changes in the left lower quadrant surrounding multiple loops of small bowel. Many of the small bowel loops demonstrate prominent diverticula. There is extraluminal air without evidence for a large abscess. There is no bowel obstruction. --Colon: There is a rectosigmoid anastomosis that is patent. There is no evidence for colonic diverticulitis. --Appendix: Normal. Vascular/Lymphatic: Atherosclerotic calcification is present within the non-aneurysmal abdominal aorta, without hemodynamically significant stenosis. --No retroperitoneal lymphadenopathy. --No mesenteric lymphadenopathy. --No pelvic or inguinal lymphadenopathy. Reproductive: Unremarkable Other: No ascites or free air. The abdominal wall is normal. Musculoskeletal. No acute displaced fractures. IMPRESSION: 1. Inflammatory changes in the left lower quadrant surrounding multiple loops of small bowel. There is extraluminal air without evidence for a large abscess. Findings are concerning for acute complicated small-bowel diverticulitis. 2. Colonic diverticulosis without CT evidence for colonic diverticulitis. 3. Bilateral nonobstructing nephrolithiasis. 4. Hepatic steatosis. Aortic Atherosclerosis (ICD10-I70.0). Electronically Signed   By: Constance Holster M.D.   On: 08/16/2019 22:10     Scheduled Meds: . dronedarone  400 mg Oral BID WC  . ezetimibe  10 mg Oral Daily  . insulin aspart  0-9 Units Subcutaneous Q4H  . magnesium oxide  400 mg Oral Daily  . sodium chloride flush  3 mL Intravenous Once   Continuous Infusions: . sodium chloride    . ciprofloxacin Stopped (08/17/19 1147)  . metronidazole Stopped (08/17/19 0954)     LOS: 1 day    Time spent: 25 minutes  spent in the coordination of care today.    Jonnie Finner, DO [facility]  If 7PM-7AM, please contact night-coverage [email or web address] 08/17/2019, 3:38 PM

## 2019-08-17 NOTE — Consult Note (Signed)
Surgical Evaluation Requesting provider: Karen Sofia PA-C  Chief Complaint: abdominal pain  HPI: This is a pleasant 69-year-old man with history of diabetes, atrial flutter on Eliquis, coronary artery disease s/p stent, diabetes, CKD stage III, and diverticulitis status post Hartman's 20 years ago followed by colostomy reversal who presents with left lower quadrant pain associated with anorexia which began approximately 2 days ago.  It kept him awake throughout Saturday night and as he was worried about recurrent diverticulitis, he did not eat really throughout Sunday despite which the pain worsened and localized in left lower quadrant.  He reports mild nausea but no emesis.  Denies any change in his bowel movements which are chronically intermittently loose.  No known fever.  Allergies  Allergen Reactions  . Ivp Dye [Iodinated Diagnostic Agents] Hives  . Lisinopril Other (See Comments)    cough  . Statins Other (See Comments)    myalgias  . Iodine Rash    Past Medical History:  Diagnosis Date  . Allergy to iodine   . Arthritis    discomfort in hands and arms  . Atrial flutter, paroxysmal (HCC)   . Coronary artery disease   . Diabetes type 2, controlled (HCC) 10/2016  . Diverticulosis   . Hypercholesterolemia   . Hypertension   . Psoriatic arthritis (HCC)   . Sinusitis     Past Surgical History:  Procedure Laterality Date  . A-FLUTTER ABLATION N/A 10/17/2017   Procedure: A-FLUTTER ABLATION;  Surgeon: Camnitz, Will Martin, MD;  Location: [facility];  Service: Cardiovascular;  Laterality: N/A;  . COLON SURGERY  2001   2 feet removed re- diverticulosis  . CORONARY STENT PLACEMENT  2011   sequential bare-metal  . INGUINAL HERNIA REPAIR    . OTHER SURGICAL HISTORY     appendectomy    Family History  Problem Relation Age of Onset  . Heart disease Mother   . Heart disease Father   . Heart disease Brother   . Diabetes Brother     Social History   Socioeconomic  History  . Marital status: Single    Spouse name: Not on file  . Number of children: 0  . Years of education: Not on file  . Highest education level: Not on file  Occupational History    Employer: [employer]  Tobacco Use  . Smoking status: Never Smoker  . Smokeless tobacco: Never Used  Substance and Sexual Activity  . Alcohol use: Not on file  . Drug use: Not on file  . Sexual activity: Not on file  Other Topics Concern  . Not on file  Social History Narrative  . Not on file   Social Determinants of Health   Financial Resource Strain:   . Difficulty of Paying Living Expenses:   Food Insecurity:   . Worried About Running Out of Food in the Last Year:   . Ran Out of Food in the Last Year:   Transportation Needs:   . Lack of Transportation (Medical):   . Lack of Transportation (Non-Medical):   Physical Activity:   . Days of Exercise per Week:   . Minutes of Exercise per Session:   Stress:   . Feeling of Stress :   Social Connections:   . Frequency of Communication with Friends and Family:   . Frequency of Social Gatherings with Friends and Family:   . Attends Religious Services:   . Active Member of Clubs or Organizations:   . Attends Club or Organization Meetings:   .   Marital Status:     No current facility-administered medications on file prior to encounter.   Current Outpatient Medications on File Prior to Encounter  Medication Sig Dispense Refill  . apixaban (ELIQUIS) 5 MG TABS tablet Take 1 tablet (5 mg total) by mouth 2 (two) times daily. 60 tablet 5  . dronedarone (MULTAQ) 400 MG tablet Take 1 tablet (400 mg total) by mouth 2 (two) times daily with a meal. 180 tablet 1  . ezetimibe (ZETIA) 10 MG tablet TAKE 1 TABLET BY MOUTH EVERY DAY (Patient taking differently: Take 10 mg by mouth daily. ) 90 tablet 0  . fenofibrate (TRICOR) 145 MG tablet TAKE 1 TABLET BY MOUTH EVERY DAY (Patient taking differently: Take 145 mg by mouth daily. ) 90 tablet 0  . Magnesium 400 MG  TABS Take 1 tablet by mouth daily.     . metFORMIN (GLUCOPHAGE XR) 500 MG 24 hr tablet Take 1 tablet (500 mg total) by mouth 2 (two) times daily with a meal. 60 tablet 0  . olmesartan (BENICAR) 5 MG tablet Take 5 mg by mouth daily.    . TRULICITY 7.78 EU/2.3NT SOPN Inject 0.75 mg into the skin every Friday.     . blood glucose meter kit and supplies Dispense based on patient and insurance preference. Use up to four times daily as directed. (FOR ICD-10 E11.10). 1 each 0    Review of Systems: a complete, 10pt review of systems was completed with pertinent positives and negatives as documented in the HPI  Physical Exam: Vitals:   08/17/19 0400 08/17/19 0415  BP: 106/77 116/68  Pulse: 76 72  Resp: (!) 24 (!) 21  Temp:    SpO2: 95% 95%   Gen: A&Ox3, no distress  Eyes: lids and conjunctivae normal, no icterus. Pupils equally round and reactive to light.  Neck: supple without mass or thyromegaly Chest: respiratory effort is normal. No crepitus or tenderness on palpation of the chest. Breath sounds equal.  Cardiovascular: RRR with palpable distal pulses, no pedal edema Gastrointestinal: soft, nondistended, mildly tender in the left lower quadrant. No mass, hepatomegaly or splenomegaly.  Well-healed midline laparotomy, left lower quadrant colostomy, and right lower quadrant appendectomy scars Lymphatic: no lymphadenopathy in the neck or groin Muscoloskeletal: no clubbing or cyanosis of the fingers.  Strength is symmetrical throughout.  Range of motion of bilateral upper and lower extremities normal without pain, crepitation or contracture. Neuro: cranial nerves grossly intact.  Sensation intact to light touch diffusely. Psych: appropriate mood and affect, normal insight/judgment intact  Skin: warm and dry   CBC Latest Ref Rng & Units 08/16/2019 10/17/2017 10/19/2016  WBC 4.0 - 10.5 K/uL 19.3(H) 8.5 11.6(H)  Hemoglobin 13.0 - 17.0 g/dL 15.0 15.2 15.7  Hematocrit 39 - 52 % 43.9 46.9 46.2   Platelets 150 - 400 K/uL 322 284 265    CMP Latest Ref Rng & Units 08/16/2019 08/20/2018 10/17/2017  Glucose 70 - 99 mg/dL 155(H) 203(H) 127(H)  BUN 8 - 23 mg/dL _0 Creatinine 0.61 - 1.24 mg/dL 1.73(H) 1.53(H) 1.40(H)  Sodium 135 - 145 mmol/L 136 139 138  Potassium 3.5 - 5.1 mmol/L 4.3 4.0 4.2  Chloride 98 - 111 mmol/L 102 106 106  CO2 22 - 32 mmol/L _1 Calcium 8.9 - 10.3 mg/dL 9.4 9.3 9.5  Total Protein 6.5 - 8.1 g/dL 6.9 6.8 -  Total Bilirubin 0.3 - 1.2 mg/dL 1.2 0.3 -  Alkaline Phos 38 - 126 U/L 51 66 -  AST 15 - 41 U/L 31 28 -  ALT 0 - 44 U/L 30 26 -    No results found for: INR, PROTIME  Imaging: CT ABDOMEN PELVIS WO CONTRAST  Result Date: 08/16/2019 CLINICAL DATA:  Left lower quadrant abdominal pain. EXAM: CT ABDOMEN AND PELVIS WITHOUT CONTRAST TECHNIQUE: Multidetector CT imaging of the abdomen and pelvis was performed following the standard protocol without IV contrast. COMPARISON:  April 26, 2017. FINDINGS: Lower chest: The lung bases are clear. The heart size is normal. Hepatobiliary: There is decreased hepatic attenuation suggestive of hepatic steatosis. Normal gallbladder.There is no biliary ductal dilation. Pancreas: Normal contours without ductal dilatation. No peripancreatic fluid collection. Spleen: Unremarkable. Adrenals/Urinary Tract: --Adrenal glands: Unremarkable. --Right kidney/ureter: Multiple nonobstructing stones are noted measuring up to approximately 7 mm in the lower pole. --Left kidney/ureter: Multiple nonobstructing stones are noted measuring up to approximately 5 mm in the lower pole. --Urinary bladder: Unremarkable. Stomach/Bowel: --Stomach/Duodenum: There is a posterior gastric diverticulum. --Small bowel: There are inflammatory changes in the left lower quadrant surrounding multiple loops of small bowel. Many of the small bowel loops demonstrate prominent diverticula. There is extraluminal air without evidence for a large abscess. There is no bowel  obstruction. --Colon: There is a rectosigmoid anastomosis that is patent. There is no evidence for colonic diverticulitis. --Appendix: Normal. Vascular/Lymphatic: Atherosclerotic calcification is present within the non-aneurysmal abdominal aorta, without hemodynamically significant stenosis. --No retroperitoneal lymphadenopathy. --No mesenteric lymphadenopathy. --No pelvic or inguinal lymphadenopathy. Reproductive: Unremarkable Other: No ascites or free air. The abdominal wall is normal. Musculoskeletal. No acute displaced fractures. IMPRESSION: 1. Inflammatory changes in the left lower quadrant surrounding multiple loops of small bowel. There is extraluminal air without evidence for a large abscess. Findings are concerning for acute complicated small-bowel diverticulitis. 2. Colonic diverticulosis without CT evidence for colonic diverticulitis. 3. Bilateral nonobstructing nephrolithiasis. 4. Hepatic steatosis. Aortic Atherosclerosis (ICD10-I70.0). Electronically Signed   By: Constance Holster M.D.   On: 08/16/2019 22:10     A/P: 69 year old man with left lower quadrant pain and leukocytosis, CT scan demonstrates inflammatory changes surrounding small bowel in the left lower quadrant with a small focus of extraluminal air suggesting acute complicated small bowel diverticulitis.  This is an unusual problem, however there is not appear to be any colonic inflammation on CT though the inflamed small bowel is directly adjacent to the descending colon. Recommend continued bowel rest, empiric broad-spectrum antibiotics, fluid resuscitation.  Surgery team will continue to follow.  Discussed with patient that this may resolve with nonoperative treatment however if pain and leukocytosis persist may require repeat laparotomy and small bowel resection.    Patient Active Problem List   Diagnosis Date Noted  . Diverticulitis of small intestine with complication 44/02/270  . CKD (chronic kidney disease), stage III  08/16/2019  . Statin myopathy 02/04/2019  . Type 2 diabetes mellitus without complication ([place]) 53/66/4403  . DKA (diabetic ketoacidoses) ([place]) 10/19/2016  . Nausea and vomiting 10/19/2016  . Acute kidney injury ([place]) 10/19/2016  . Hyponatremia 10/19/2016  . Coronary artery disease   . Hypertension associated with diabetes ([place])   . Hyperlipidemia associated with type 2 diabetes mellitus ([place])   . Atrial flutter, paroxysmal ([place])   . Sinusitis   . Diverticulosis   . Arthritis        Romana Juniper, MD [facility], [place]  See AMION to contact appropriate on-call provider

## 2019-08-18 LAB — CBC WITH DIFFERENTIAL/PLATELET
Abs Immature Granulocytes: 0.05 10*3/uL (ref 0.00–0.07)
Basophils Absolute: 0 10*3/uL (ref 0.0–0.1)
Basophils Relative: 0 %
Eosinophils Absolute: 0.2 10*3/uL (ref 0.0–0.5)
Eosinophils Relative: 2 %
HCT: 38.4 % — ABNORMAL LOW (ref 39.0–52.0)
Hemoglobin: 12.8 g/dL — ABNORMAL LOW (ref 13.0–17.0)
Immature Granulocytes: 1 %
Lymphocytes Relative: 22 %
Lymphs Abs: 2.3 10*3/uL (ref 0.7–4.0)
MCH: 30.6 pg (ref 26.0–34.0)
MCHC: 33.3 g/dL (ref 30.0–36.0)
MCV: 91.9 fL (ref 80.0–100.0)
Monocytes Absolute: 0.8 10*3/uL (ref 0.1–1.0)
Monocytes Relative: 8 %
Neutro Abs: 6.9 10*3/uL (ref 1.7–7.7)
Neutrophils Relative %: 67 %
Platelets: 250 10*3/uL (ref 150–400)
RBC: 4.18 MIL/uL — ABNORMAL LOW (ref 4.22–5.81)
RDW: 12.3 % (ref 11.5–15.5)
WBC: 10.3 10*3/uL (ref 4.0–10.5)
nRBC: 0 % (ref 0.0–0.2)

## 2019-08-18 LAB — RENAL FUNCTION PANEL
Albumin: 3.3 g/dL — ABNORMAL LOW (ref 3.5–5.0)
Anion gap: 11 (ref 5–15)
BUN: 15 mg/dL (ref 8–23)
CO2: 22 mmol/L (ref 22–32)
Calcium: 8.7 mg/dL — ABNORMAL LOW (ref 8.9–10.3)
Chloride: 104 mmol/L (ref 98–111)
Creatinine, Ser: 1.55 mg/dL — ABNORMAL HIGH (ref 0.61–1.24)
GFR calc Af Amer: 52 mL/min — ABNORMAL LOW (ref >60)
GFR calc non Af Amer: 45 mL/min — ABNORMAL LOW (ref >60)
Glucose, Bld: 115 mg/dL — ABNORMAL HIGH (ref 70–99)
Phosphorus: 3.2 mg/dL (ref 2.5–4.6)
Potassium: 3.9 mmol/L (ref 3.5–5.1)
Sodium: 137 mmol/L (ref 135–145)

## 2019-08-18 LAB — GLUCOSE, CAPILLARY
Glucose-Capillary: 99 mg/dL (ref 70–99)
Glucose-Capillary: 106 mg/dL — ABNORMAL HIGH (ref 70–99)
Glucose-Capillary: 185 mg/dL — ABNORMAL HIGH (ref 70–99)
Glucose-Capillary: 163 mg/dL — ABNORMAL HIGH (ref 70–99)
Glucose-Capillary: 181 mg/dL — ABNORMAL HIGH (ref 70–99)

## 2019-08-18 LAB — IRON AND TIBC
Iron: 25 ug/dL — ABNORMAL LOW (ref 45–182)
Saturation Ratios: 9 % — ABNORMAL LOW (ref 17.9–39.5)
TIBC: 286 ug/dL (ref 250–450)
UIBC: 261 ug/dL

## 2019-08-18 LAB — MAGNESIUM: Magnesium: 1.9 mg/dL (ref 1.7–2.4)

## 2019-08-18 MED ORDER — BOOST / RESOURCE BREEZE PO LIQD CUSTOM
1.0000 | Freq: Two times a day (BID) | ORAL | Status: DC
  Administered 2019-08-18: 1 via ORAL

## 2019-08-18 NOTE — Progress Notes (Signed)
[facility] Progress Note     Subjective: CC-  Feeling better than yesterday. Still feels bloated but pain is less. He reports only mild LLQ pain. Denies n/v. Passing some flatus, no BM. WBC down 10.3, afebrile.  Objective: Vital signs in last 24 hours: Temp:  [98.6 F (37 C)-99.8 F (37.7 C)] 98.9 F (37.2 C) (07/13 0404) Pulse Rate:  [69-81] 73 (07/13 0404) Resp:  [16-22] 17 (07/13 0404) BP: (112-127)/(65-76) 115/72 (07/13 0404) SpO2:  [91 %-95 %] 92 % (07/13 0404) Weight:  [110.6 kg] 110.6 kg (07/13 0404) Last BM Date: 08/16/19  Intake/Output from previous day: 07/12 0701 - 07/13 0700 In: 2017.1 [P.O.:60; I.V.:1657.1; IV Piggyback:300] Out: 450 [Urine:450] Intake/Output this shift: No intake/output data recorded.  PE: Gen:  Alert, NAD, pleasant HEENT: EOM's intact, pupils equal and round Card:  RRR Pulm:  CTAB on anterior exam, no W/R/R, rate and effort normal Abd: well healed midline incision, soft, distended, +BS, no HSM, mild LLQ TTP without rebound or guarding/ no peritonitis Psych: A&Ox4  Skin: no rashes noted, warm and dry  Lab Results:  Recent Labs    08/17/19 0452 08/18/19 0739  WBC 13.7* 10.3  HGB 13.2 12.8*  HCT 38.4* 38.4*  PLT 268 250   BMET Recent Labs    08/16/19 1626 08/17/19 0452  NA 136 137  K 4.3 3.5  CL 102 105  CO2 23 22  GLUCOSE 155* 130*  BUN 15 16  CREATININE 1.73* 1.68*  CALCIUM 9.4 8.7*   PT/INR No results for input(s): LABPROT, INR in the last 72 hours. CMP     Component Value Date/Time   NA 137 08/17/2019 0452   K 3.5 08/17/2019 0452   CL 105 08/17/2019 0452   CO2 22 08/17/2019 0452   GLUCOSE 130 (H) 08/17/2019 0452   BUN 16 08/17/2019 0452   CREATININE 1.68 (H) 08/17/2019 0452   CREATININE 1.23 03/23/2015 1010   CALCIUM 8.7 (L) 08/17/2019 0452   PROT 6.9 08/16/2019 1626   ALBUMIN 4.2 08/16/2019 1626   AST 31 08/16/2019 1626   ALT 30 08/16/2019 1626   ALKPHOS 51 08/16/2019 1626   BILITOT 1.2  08/16/2019 1626   GFRNONAA 41 (L) 08/17/2019 0452   GFRAA 47 (L) 08/17/2019 0452   Lipase     Component Value Date/Time   LIPASE 58 (H) 08/16/2019 1626       Studies/Results: CT ABDOMEN PELVIS WO CONTRAST  Result Date: 08/16/2019 CLINICAL DATA:  Left lower quadrant abdominal pain. EXAM: CT ABDOMEN AND PELVIS WITHOUT CONTRAST TECHNIQUE: Multidetector CT imaging of the abdomen and pelvis was performed following the standard protocol without IV contrast. COMPARISON:  April 26, 2017. FINDINGS: Lower chest: The lung bases are clear. The heart size is normal. Hepatobiliary: There is decreased hepatic attenuation suggestive of hepatic steatosis. Normal gallbladder.There is no biliary ductal dilation. Pancreas: Normal contours without ductal dilatation. No peripancreatic fluid collection. Spleen: Unremarkable. Adrenals/Urinary Tract: --Adrenal glands: Unremarkable. --Right kidney/ureter: Multiple nonobstructing stones are noted measuring up to approximately 7 mm in the lower pole. --Left kidney/ureter: Multiple nonobstructing stones are noted measuring up to approximately 5 mm in the lower pole. --Urinary bladder: Unremarkable. Stomach/Bowel: --Stomach/Duodenum: There is a posterior gastric diverticulum. --Small bowel: There are inflammatory changes in the left lower quadrant surrounding multiple loops of small bowel. Many of the small bowel loops demonstrate prominent diverticula. There is extraluminal air without evidence for a large abscess. There is no bowel obstruction. --Colon: There is a rectosigmoid anastomosis that  is patent. There is no evidence for colonic diverticulitis. --Appendix: Normal. Vascular/Lymphatic: Atherosclerotic calcification is present within the non-aneurysmal abdominal aorta, without hemodynamically significant stenosis. --No retroperitoneal lymphadenopathy. --No mesenteric lymphadenopathy. --No pelvic or inguinal lymphadenopathy. Reproductive: Unremarkable Other: No ascites or  free air. The abdominal wall is normal. Musculoskeletal. No acute displaced fractures. IMPRESSION: 1. Inflammatory changes in the left lower quadrant surrounding multiple loops of small bowel. There is extraluminal air without evidence for a large abscess. Findings are concerning for acute complicated small-bowel diverticulitis. 2. Colonic diverticulosis without CT evidence for colonic diverticulitis. 3. Bilateral nonobstructing nephrolithiasis. 4. Hepatic steatosis. Aortic Atherosclerosis (ICD10-I70.0). Electronically Signed   By: Constance Holster M.D.   On: 08/16/2019 22:10    Anti-infectives: Anti-infectives (From admission, onward)   Start     Dose/Rate Route Frequency Ordered Stop   08/17/19 0800  metroNIDAZOLE (FLAGYL) IVPB 500 mg     Discontinue     500 mg 100 mL/hr over 60 Minutes Intravenous Every 8 hours 08/17/19 0034     08/16/19 2245  ciprofloxacin (CIPRO) IVPB 400 mg     Discontinue     400 mg 200 mL/hr over 60 Minutes Intravenous Every 12 hours 08/16/19 2241     08/16/19 2245  metroNIDAZOLE (FLAGYL) IVPB 500 mg        500 mg 100 mL/hr over 60 Minutes Intravenous  Once 08/16/19 2241 08/17/19 0116       Assessment/Plan DM - A1c 7.6 Paroxysmal atrial flutter/fibrillation on eliquis (last dose 7/11 in PM) CAD with stents HTN HLD CKD stage III Hx diverticulitis s/p Hartmans followed by colostomy reversal ~20 years ago  Small bowel diverticulitis with small focus extraluminal air - CT 7/11 shows inflammatory changes in the left lower quadrant surrounding multiple loops of small bowel with extraluminal air without evidence for a large abscess, concerning for acute complicated small-bowel diverticulitis; colonic diverticulosis  without CT evidence for colonic diverticulitis  ID - cipro/flagyl 7/11>> FEN - IVF, CLD, Boost breeze VTE - SCDs, hold eliquis but ok for chemical DVT prophylaxis or IV heparin if needed Foley - none Follow up - TBD  Plan - Leukocytosis resolved  and pain improving. Ok for clear liquids. Continue broad spectrum antibiotics.   LOS: 2 days    [facility] 08/18/2019, 8:37 AM Please see Amion for pager number during day hours 7:00am-4:30pm

## 2019-08-18 NOTE — Progress Notes (Signed)
PROGRESS NOTE    Shawn Hernandez  EXH:[id number] DOB: 1950/03/03 DOA: 08/16/2019 PCP: Shawn Seashore, MD   Brief Narrative:   Shawn Kiel Stinnettis a 69 y.o.malewith medical history significant forparoxysmal atrialfibrillation/flutter on Eliquis (s/p atrial flutter ablation 10/17/2017), CAD s/p BMS, T2DM, CKD stage III, HTN, HLD, psoriatic arthritis, and history of bowel resection 20 years ago due to diverticulitis who presents to the ED for evaluation of LLQ abdominal pain.  7/13: WBC down. Ab pain better. Tolerating CLD. Improving.   Assessment & Plan:   Principal Problem:   Diverticulitis of small intestine with complication Active Problems:   Hyperlipidemia associated with type 2 diabetes mellitus (HCC)   Atrial flutter, paroxysmal (HCC)   Coronary artery disease   Hypertension associated with diabetes ([place])   Type 2 diabetes mellitus without complication (HCC)   CKD (chronic kidney disease), stage III  Acute complicated small bowel diverticulitis:     - Seen on CT imaging with findings of extraluminal air without evidence of large abscess.     - fluids, bowel rest, cipro/flagyl IV     - appreciate general surgery assistance     - 7/13: continue cipro/flagyl; on CLD and tolerating, advance as tolerated; appreciate gen surg assistance  Paroxysmal atrial flutter/fibrillation:     - s/p atrial flutter ablation 10/17/2017 by Dr. Curt Bears.      - Patient is in sinus rhythm with controlled rate on admission.     - continue to home Eliquis for now in case he will need surgical intervention     - dronedarone and magnesium  CKD stage IIIb:     - Bilateral nonobstructing nephrolithiasis noted on CT imaging.     - SCr baseline is 1.4-1.5     - Scr 1.55 today, follow  CAD s/p BMS:     - Denies any chest pain.       - Holding Eliquis as above.       - continue zetia and fenofibrate  Type 2 diabetes, uncontrolled:     - Hold home Metformin and Trulicity while in  hospital.     - Sensitive SSI q4h while NPO.     - A1c 7.6  Hypertension:     - BP is ok while off home olmesartan; hold for now  Hyperlipidemia:     - continue zetia, fenofibrate  Normocytic anemia     - check iron studies     - no evidence of bleed  DVT prophylaxis: SCDs Code Status: DNI Family Communication: None at bedside   Status is: Inpatient  Remains inpatient appropriate because:Inpatient level of care appropriate due to severity of illness   Dispo: The patient is from: Home              Anticipated d/c is to: Home              Anticipated d/c date is: 1 day              Patient currently is not medically stable to d/c.  Consultants:   General Surgery  Procedures:   None  Antimicrobials:  . Cipro, flagyl   Subjective: "I feel like I can sense the water over here."  Objective: Vitals:   08/17/19 1809 08/17/19 2004 08/18/19 0150 08/18/19 0404  BP: 117/69 127/76 119/65 115/72  Pulse: 73 79 77 73  Resp: 20 17 17 17   Temp: 99.5 F (37.5 C) 99.2 F (37.3 C) 99.4 F (37.4 C) 98.9 F (37.2 C)  TempSrc: Oral Oral    SpO2: 95% 95% 94% 92%  Weight:    110.6 kg  Height:        Intake/Output Summary (Last 24 hours) at 08/18/2019 7564 Last data filed at 08/18/2019 0406 Gross per 24 hour  Intake 2017.05 ml  Output 450 ml  Net 1567.05 ml   Filed Weights   08/16/19 2140 08/18/19 0404  Weight: 111.1 kg 110.6 kg    Examination:  General: 69 y.o. male resting in bed in NAD Cardiovascular: RRR, +S1, S2, no m/g/r Respiratory: CTABL, no w/r/r, normal WOB GI: BS+, NDNT, soft MSK: No e/c/c Neuro: A&O x 3, no focal deficits Psyc: Appropriate interaction and affect, calm/cooperative   Data Reviewed: I have personally reviewed following labs and imaging studies.  CBC: Recent Labs  Lab 08/16/19 1626 08/17/19 0452  WBC 19.3* 13.7*  HGB 15.0 13.2  HCT 43.9 38.4*  MCV 91.5 91.2  PLT 322 332   Basic Metabolic Panel: Recent Labs  Lab  08/16/19 1626 08/17/19 0452  NA 136 137  K 4.3 3.5  CL 102 105  CO2 23 22  GLUCOSE 155* 130*  BUN 15 16  CREATININE 1.73* 1.68*  CALCIUM 9.4 8.7*   GFR: Estimated Creatinine Clearance: 52.5 mL/min (A) (by C-G formula based on SCr of 1.68 mg/dL (H)). Liver Function Tests: Recent Labs  Lab 08/16/19 1626  AST 31  ALT 30  ALKPHOS 51  BILITOT 1.2  PROT 6.9  ALBUMIN 4.2   Recent Labs  Lab 08/16/19 1626  LIPASE 58*   No results for input(s): AMMONIA in the last 168 hours. Coagulation Profile: No results for input(s): INR, PROTIME in the last 168 hours. Cardiac Enzymes: No results for input(s): CKTOTAL, CKMB, CKMBINDEX, TROPONINI in the last 168 hours. BNP (last 3 results) No results for input(s): PROBNP in the last 8760 hours. HbA1C: Recent Labs    08/17/19 0500  HGBA1C 7.6*   CBG: Recent Labs  Lab 08/17/19 1429 08/17/19 1624 08/17/19 2007 08/17/19 2351 08/18/19 0402  GLUCAP 112* 95 92 132* 99   Lipid Profile: No results for input(s): CHOL, HDL, LDLCALC, TRIG, CHOLHDL, LDLDIRECT in the last 72 hours. Thyroid Function Tests: No results for input(s): TSH, T4TOTAL, FREET4, T3FREE, THYROIDAB in the last 72 hours. Anemia Panel: No results for input(s): VITAMINB12, FOLATE, FERRITIN, TIBC, IRON, RETICCTPCT in the last 72 hours. Sepsis Labs: No results for input(s): PROCALCITON, LATICACIDVEN in the last 168 hours.  Recent Results (from the past 240 hour(s))  SARS Coronavirus 2 by RT PCR (hospital order, performed in [facility] hospital lab) Nasopharyngeal Nasopharyngeal Swab     Status: None   Collection Time: 08/16/19 11:39 PM   Specimen: Nasopharyngeal Swab  Result Value Ref Range Status   SARS Coronavirus 2 NEGATIVE NEGATIVE Final    Comment: (NOTE) SARS-CoV-2 target nucleic acids are NOT DETECTED.  The SARS-CoV-2 RNA is generally detectable in upper and lower respiratory specimens during the acute phase of infection. The lowest concentration of  SARS-CoV-2 viral copies this assay can detect is 250 copies / mL. A negative result does not preclude SARS-CoV-2 infection and should not be used as the sole basis for treatment or other patient management decisions.  A negative result may occur with improper specimen collection / handling, submission of specimen other than nasopharyngeal swab, presence of viral mutation(s) within the areas targeted by this assay, and inadequate number of viral copies (<250 copies / mL). A negative result must be combined with clinical observations, patient  history, and epidemiological information.  Fact Sheet for Patients:   StrictlyIdeas.no  Fact Sheet for Healthcare Providers: BankingDealers.co.za  This test is not yet approved or  cleared by the [place] FDA and has been authorized for detection and/or diagnosis of SARS-CoV-2 by FDA under an Emergency Use Authorization (EUA).  This EUA will remain in effect (meaning this test can be used) for the duration of the COVID-19 declaration under Section 564(b)(1) of the Act, 21 U.S.C. section 360bbb-3(b)(1), unless the authorization is terminated or revoked sooner.  Performed at [facility], [place] [address] [place], [place] [address]       Radiology Studies: CT ABDOMEN PELVIS WO CONTRAST  Result Date: 08/16/2019 CLINICAL DATA:  Left lower quadrant abdominal pain. EXAM: CT ABDOMEN AND PELVIS WITHOUT CONTRAST TECHNIQUE: Multidetector CT imaging of the abdomen and pelvis was performed following the standard protocol without IV contrast. COMPARISON:  April 26, 2017. FINDINGS: Lower chest: The lung bases are clear. The heart size is normal. Hepatobiliary: There is decreased hepatic attenuation suggestive of hepatic steatosis. Normal gallbladder.There is no biliary ductal dilation. Pancreas: Normal contours without ductal dilatation. No peripancreatic fluid collection. Spleen: Unremarkable.  Adrenals/Urinary Tract: --Adrenal glands: Unremarkable. --Right kidney/ureter: Multiple nonobstructing stones are noted measuring up to approximately 7 mm in the lower pole. --Left kidney/ureter: Multiple nonobstructing stones are noted measuring up to approximately 5 mm in the lower pole. --Urinary bladder: Unremarkable. Stomach/Bowel: --Stomach/Duodenum: There is a posterior gastric diverticulum. --Small bowel: There are inflammatory changes in the left lower quadrant surrounding multiple loops of small bowel. Many of the small bowel loops demonstrate prominent diverticula. There is extraluminal air without evidence for a large abscess. There is no bowel obstruction. --Colon: There is a rectosigmoid anastomosis that is patent. There is no evidence for colonic diverticulitis. --Appendix: Normal. Vascular/Lymphatic: Atherosclerotic calcification is present within the non-aneurysmal abdominal aorta, without hemodynamically significant stenosis. --No retroperitoneal lymphadenopathy. --No mesenteric lymphadenopathy. --No pelvic or inguinal lymphadenopathy. Reproductive: Unremarkable Other: No ascites or free air. The abdominal wall is normal. Musculoskeletal. No acute displaced fractures. IMPRESSION: 1. Inflammatory changes in the left lower quadrant surrounding multiple loops of small bowel. There is extraluminal air without evidence for a large abscess. Findings are concerning for acute complicated small-bowel diverticulitis. 2. Colonic diverticulosis without CT evidence for colonic diverticulitis. 3. Bilateral nonobstructing nephrolithiasis. 4. Hepatic steatosis. Aortic Atherosclerosis (ICD10-I70.0). Electronically Signed   By: Constance Holster M.D.   On: 08/16/2019 22:10     Scheduled Meds: . dronedarone  400 mg Oral BID WC  . ezetimibe  10 mg Oral Daily  . insulin aspart  0-9 Units Subcutaneous Q4H  . magnesium oxide  400 mg Oral Daily  . sodium chloride flush  3 mL Intravenous Once   Continuous  Infusions: . sodium chloride 100 mL/hr at 08/17/19 1624  . ciprofloxacin 400 mg (08/17/19 2256)  . metronidazole 500 mg (08/18/19 0058)     LOS: 2 days    Time spent: 25 minutes spent in the coordination of care today.    Jonnie Finner, DO [facility]  If 7PM-7AM, please contact night-coverage [email or web address] 08/18/2019, 7:28 AM

## 2019-08-19 LAB — GLUCOSE, CAPILLARY
Glucose-Capillary: 117 mg/dL — ABNORMAL HIGH (ref 70–99)
Glucose-Capillary: 123 mg/dL — ABNORMAL HIGH (ref 70–99)
Glucose-Capillary: 131 mg/dL — ABNORMAL HIGH (ref 70–99)
Glucose-Capillary: 145 mg/dL — ABNORMAL HIGH (ref 70–99)
Glucose-Capillary: 149 mg/dL — ABNORMAL HIGH (ref 70–99)
Glucose-Capillary: 176 mg/dL — ABNORMAL HIGH (ref 70–99)
Glucose-Capillary: 94 mg/dL (ref 70–99)

## 2019-08-19 LAB — CBC WITH DIFFERENTIAL/PLATELET
Abs Immature Granulocytes: 0.05 10*3/uL (ref 0.00–0.07)
Basophils Absolute: 0.1 10*3/uL (ref 0.0–0.1)
Basophils Relative: 1 %
Eosinophils Absolute: 0.3 10*3/uL (ref 0.0–0.5)
Eosinophils Relative: 4 %
HCT: 37.4 % — ABNORMAL LOW (ref 39.0–52.0)
Hemoglobin: 12.4 g/dL — ABNORMAL LOW (ref 13.0–17.0)
Immature Granulocytes: 1 %
Lymphocytes Relative: 28 %
Lymphs Abs: 2.5 10*3/uL (ref 0.7–4.0)
MCH: 30.6 pg (ref 26.0–34.0)
MCHC: 33.2 g/dL (ref 30.0–36.0)
MCV: 92.3 fL (ref 80.0–100.0)
Monocytes Absolute: 0.8 10*3/uL (ref 0.1–1.0)
Monocytes Relative: 9 %
Neutro Abs: 5 10*3/uL (ref 1.7–7.7)
Neutrophils Relative %: 57 %
Platelets: 262 10*3/uL (ref 150–400)
RBC: 4.05 MIL/uL — ABNORMAL LOW (ref 4.22–5.81)
RDW: 12.1 % (ref 11.5–15.5)
WBC: 8.7 10*3/uL (ref 4.0–10.5)
nRBC: 0 % (ref 0.0–0.2)

## 2019-08-19 LAB — RENAL FUNCTION PANEL
Albumin: 3.3 g/dL — ABNORMAL LOW (ref 3.5–5.0)
Anion gap: 9 (ref 5–15)
BUN: 12 mg/dL (ref 8–23)
CO2: 23 mmol/L (ref 22–32)
Calcium: 8.7 mg/dL — ABNORMAL LOW (ref 8.9–10.3)
Chloride: 105 mmol/L (ref 98–111)
Creatinine, Ser: 1.4 mg/dL — ABNORMAL HIGH (ref 0.61–1.24)
GFR calc Af Amer: 59 mL/min — ABNORMAL LOW (ref >60)
GFR calc non Af Amer: 51 mL/min — ABNORMAL LOW (ref >60)
Glucose, Bld: 128 mg/dL — ABNORMAL HIGH (ref 70–99)
Phosphorus: 3 mg/dL (ref 2.5–4.6)
Potassium: 3.8 mmol/L (ref 3.5–5.1)
Sodium: 137 mmol/L (ref 135–145)

## 2019-08-19 LAB — MAGNESIUM: Magnesium: 2 mg/dL (ref 1.7–2.4)

## 2019-08-19 MED ORDER — FENOFIBRATE 160 MG PO TABS
160.0000 mg | ORAL_TABLET | Freq: Every day | ORAL | Status: DC
  Administered 2019-08-19: 160 mg via ORAL
  Filled 2019-08-19: qty 1

## 2019-08-19 NOTE — Progress Notes (Signed)
[facility] Progress Note     Subjective: CC-  Continues to feel better each day. Tolerating full liquids. Denies n/v. Passing flatus, no BM. Feels like he needs to have a BM. Abdomen less distended. States that he has a spot in his LLQ that is sore but he denies any true pain. WBC 8.7, afebrile  Objective: Vital signs in last 24 hours: Temp:  [98.4 F (36.9 C)-99.3 F (37.4 C)] 98.4 F (36.9 C) (07/14 0434) Pulse Rate:  [64-88] 64 (07/14 0434) Resp:  [17-18] 18 (07/14 0434) BP: (122-126)/(63-72) 122/72 (07/14 0434) SpO2:  [93 %-97 %] 93 % (07/14 0434) Last BM Date: 08/15/19  Intake/Output from previous day: 07/13 0701 - 07/14 0700 In: 1020 [P.O.:620; IV Piggyback:400] Out: 1475 [Urine:1475] Intake/Output this shift: Total I/O In: -  Out: 400 [Urine:400]  PE: Gen:  Alert, NAD, pleasant HEENT: EOM's intact, pupils equal and round Card:  RRR Pulm:  CTAB on anterior exam, no W/R/R, rate and effort normal Abd: well healed midline incision, soft, mild distension, +BS, no HSM, mild LLQ TTP without rebound or guarding/ no peritonitis Psych: A&Ox4  Skin: no rashes noted, warm and dry   Lab Results:  Recent Labs    08/18/19 0739 08/19/19 0255  WBC 10.3 8.7  HGB 12.8* 12.4*  HCT 38.4* 37.4*  PLT 250 262   BMET Recent Labs    08/18/19 0739 08/19/19 0255  NA 137 137  K 3.9 3.8  CL 104 105  CO2 22 23  GLUCOSE 115* 128*  BUN 15 12  CREATININE 1.55* 1.40*  CALCIUM 8.7* 8.7*   PT/INR No results for input(s): LABPROT, INR in the last 72 hours. CMP     Component Value Date/Time   NA 137 08/19/2019 0255   K 3.8 08/19/2019 0255   CL 105 08/19/2019 0255   CO2 23 08/19/2019 0255   GLUCOSE 128 (H) 08/19/2019 0255   BUN 12 08/19/2019 0255   CREATININE 1.40 (H) 08/19/2019 0255   CREATININE 1.23 03/23/2015 1010   CALCIUM 8.7 (L) 08/19/2019 0255   PROT 6.9 08/16/2019 1626   ALBUMIN 3.3 (L) 08/19/2019 0255   AST 31 08/16/2019 1626   ALT 30 08/16/2019  1626   ALKPHOS 51 08/16/2019 1626   BILITOT 1.2 08/16/2019 1626   GFRNONAA 51 (L) 08/19/2019 0255   GFRAA 59 (L) 08/19/2019 0255   Lipase     Component Value Date/Time   LIPASE 58 (H) 08/16/2019 1626       Studies/Results: No results found.  Anti-infectives: Anti-infectives (From admission, onward)   Start     Dose/Rate Route Frequency Ordered Stop   08/17/19 0800  metroNIDAZOLE (FLAGYL) IVPB 500 mg     Discontinue     500 mg 100 mL/hr over 60 Minutes Intravenous Every 8 hours 08/17/19 0034     08/16/19 2245  ciprofloxacin (CIPRO) IVPB 400 mg     Discontinue     400 mg 200 mL/hr over 60 Minutes Intravenous Every 12 hours 08/16/19 2241     08/16/19 2245  metroNIDAZOLE (FLAGYL) IVPB 500 mg        500 mg 100 mL/hr over 60 Minutes Intravenous  Once 08/16/19 2241 08/17/19 0116       Assessment/Plan DM - A1c 7.6 Paroxysmal atrial flutter/fibrillation on eliquis (last dose 7/11 in PM) CAD with stents HTN HLD CKD stage III Hx diverticulitis s/p [place] followed by colostomy reversal ~20 years ago  Small bowel diverticulitis with small focus extraluminal air -  CT 7/11 shows inflammatory changes in the left lower quadrant surrounding multiple loops of small bowel with extraluminal air without evidence for a large abscess, concerning for acute complicated small-bowel diverticulitis; colonic diverticulosis  without CT evidence for colonic diverticulitis  ID - cipro/flagyl 7/11>>day#3 FEN - IVF, soft diet, Boost VTE - SCDs, hold eliquis but ok for chemical DVT prophylaxis or IV heparin if needed Foley - none Follow up - no surgical follow up needed  Plan - Continues to improve. Advance to soft diet. If patient tolerating diet advancement and continues to have little/no abdominal pain, he is ok for discharge this afternoon. if any question would keep until tomorrow. He should transition to oral cipro/flagyl at discharge to complete a total of 10 days of antibiotics. Ok to  restart eliquis at discharge. No surgical follow up needed.   LOS: 3 days    [facility] 08/19/2019, 7:56 AM Please see Amion for pager number during day hours 7:00am-4:30pm

## 2019-08-19 NOTE — Progress Notes (Signed)
Patient ID: Shawn Hernandez, male   DOB: 07-Jan-1951, 69 y.o.   MRN: [id number]  PROGRESS NOTE    JAHKING LESSER  XTG:[id number] DOB: October 06, 1950 DOA: 08/16/2019 PCP: [name], MD   Brief Narrative:  69 year old male with history of paroxysmal A. fib/flutter on Eliquis status post atrial flutter ablation on 10/17/2017, CAD status post BMS, diabetes mellitus type 2, chronic renal disease stage III, hypertension, hyperlipidemia, psoriatic arthritis, bowel resection 20 years ago due to diverticulitis presented with left lower quadrant abdominal pain.  He was admitted with acute diverticulitis with findings of extraluminal air.  He was started on IV antibiotics.  General surgery was consulted.  Assessment & Plan:   Acute diverticulitis of small bowel with small focus of extraluminal air -Improving.  Currently in intravenous ciprofloxacin and Flagyl.  General surgery following.  Diet advancement as per general surgery.  No need for surgical intervention as per general surgery. -Decrease normal saline to 50 cc an hour.  Paroxysmal atrial flutter/fibrillation -Status post flutter ablation on 10/17/2017 by Dr. Curt Bears -Currently rate controlled.  Continue dronedarone and magnesium oxide.  Resume Eliquis on discharge  Chronic kidney disease stage IIIb -Creatinine stable.  CAD status post BMS -Stable.  Eliquis on hold.  Continue Zetia and fenofibrate  Diabetes mellitus type 2 uncontrolled with hyperglycemia  -A1c 7.6  -Oral meds on hold.  Continue CBGs with SSI  Hypertension -Blood pressure stable.  Olmesartan on hold  Hyperlipidemia -Continue Zetia, fenofibrate  Normocytic anemia -Hemoglobin stable  DVT prophylaxis: SCDs Code Status: Partial Family Communication: Patient at bedside Disposition Plan: Status is: Inpatient  Remains inpatient appropriate because:IV treatments appropriate due to intensity of illness or inability to take PO   Dispo: The patient is from: Home               Anticipated d/c is to: Home              Anticipated d/c date is: 1 day              Patient currently is not medically stable to d/c.  Consultants: General surgery  Procedures: None  Antimicrobials:  Ciprofloxacin and Flagyl from admission onwards  Subjective: Patient seen and examined at bedside.  He feels slightly better.  Has not had bowel movement yet.  Passing gas.  Denies any overnight fever, vomiting.  Has tolerated liquid diet.  Objective: Vitals:   08/18/19 0404 08/18/19 1847 08/18/19 2016 08/19/19 0434  BP: 115/72 125/69 126/63 122/72  Pulse: 73 88 74 64  Resp: 17 18 17 18   Temp: 98.9 F (37.2 C) 98.9 F (37.2 C) 99.3 F (37.4 C) 98.4 F (36.9 C)  TempSrc:   Oral Oral  SpO2: 92% 94% 97% 93%  Weight: 110.6 kg     Height:        Intake/Output Summary (Last 24 hours) at 08/19/2019 1048 Last data filed at 08/19/2019 0743 Gross per 24 hour  Intake 500 ml  Output 1700 ml  Net -1200 ml   Filed Weights   08/16/19 2140 08/18/19 0404  Weight: 111.1 kg 110.6 kg    Examination:  General exam: Appears calm and comfortable  Respiratory system: Bilateral decreased breath sounds at bases Cardiovascular system: S1 & S2 heard, Rate controlled Gastrointestinal system: Abdomen is nondistended, soft and nontender. Normal bowel sounds heard. Extremities: No cyanosis, clubbing, edema   Data Reviewed: I have personally reviewed following labs and imaging studies  CBC: Recent Labs  Lab 08/16/19 1626 08/17/19 0452 08/18/19  7371 08/19/19 0255  WBC 19.3* 13.7* 10.3 8.7  NEUTROABS  --   --  6.9 5.0  HGB 15.0 13.2 12.8* 12.4*  HCT 43.9 38.4* 38.4* 37.4*  MCV 91.5 91.2 91.9 92.3  PLT 322 268 250 062   Basic Metabolic Panel: Recent Labs  Lab 08/16/19 1626 08/17/19 0452 08/18/19 0739 08/19/19 0255  NA 136 137 137 137  K 4.3 3.5 3.9 3.8  CL 102 105 104 105  CO2 23 22 22 23   GLUCOSE 155* 130* 115* 128*  BUN 15 16 15 12   CREATININE 1.73* 1.68* 1.55*  1.40*  CALCIUM 9.4 8.7* 8.7* 8.7*  MG  --   --  1.9 2.0  PHOS  --   --  3.2 3.0   GFR: Estimated Creatinine Clearance: 63.1 mL/min (A) (by C-G formula based on SCr of 1.4 mg/dL (H)). Liver Function Tests: Recent Labs  Lab 08/16/19 1626 08/18/19 0739 08/19/19 0255  AST 31  --   --   ALT 30  --   --   ALKPHOS 51  --   --   BILITOT 1.2  --   --   PROT 6.9  --   --   ALBUMIN 4.2 3.3* 3.3*   Recent Labs  Lab 08/16/19 1626  LIPASE 58*   No results for input(s): AMMONIA in the last 168 hours. Coagulation Profile: No results for input(s): INR, PROTIME in the last 168 hours. Cardiac Enzymes: No results for input(s): CKTOTAL, CKMB, CKMBINDEX, TROPONINI in the last 168 hours. BNP (last 3 results) No results for input(s): PROBNP in the last 8760 hours. HbA1C: Recent Labs    08/17/19 0500  HGBA1C 7.6*   CBG: Recent Labs  Lab 08/18/19 1626 08/18/19 2013 08/19/19 0012 08/19/19 0431 08/19/19 0740  GLUCAP 163* 181* 94 131* 117*   Lipid Profile: No results for input(s): CHOL, HDL, LDLCALC, TRIG, CHOLHDL, LDLDIRECT in the last 72 hours. Thyroid Function Tests: No results for input(s): TSH, T4TOTAL, FREET4, T3FREE, THYROIDAB in the last 72 hours. Anemia Panel: Recent Labs    08/18/19 1500  TIBC 286  IRON 25*   Sepsis Labs: No results for input(s): PROCALCITON, LATICACIDVEN in the last 168 hours.  Recent Results (from the past 240 hour(s))  SARS Coronavirus 2 by RT PCR (hospital order, performed in [facility] hospital lab) Nasopharyngeal Nasopharyngeal Swab     Status: None   Collection Time: 08/16/19 11:39 PM   Specimen: Nasopharyngeal Swab  Result Value Ref Range Status   SARS Coronavirus 2 NEGATIVE NEGATIVE Final    Comment: (NOTE) SARS-CoV-2 target nucleic acids are NOT DETECTED.  The SARS-CoV-2 RNA is generally detectable in upper and lower respiratory specimens during the acute phase of infection. The lowest concentration of SARS-CoV-2 viral copies this  assay can detect is 250 copies / mL. A negative result does not preclude SARS-CoV-2 infection and should not be used as the sole basis for treatment or other patient management decisions.  A negative result may occur with improper specimen collection / handling, submission of specimen other than nasopharyngeal swab, presence of viral mutation(s) within the areas targeted by this assay, and inadequate number of viral copies (<250 copies / mL). A negative result must be combined with clinical observations, patient history, and epidemiological information.  Fact Sheet for Patients:   StrictlyIdeas.no  Fact Sheet for Healthcare Providers: BankingDealers.co.za  This test is not yet approved or  cleared by the [place] FDA and has been authorized for detection and/or diagnosis of  SARS-CoV-2 by FDA under an Emergency Use Authorization (EUA).  This EUA will remain in effect (meaning this test can be used) for the duration of the COVID-19 declaration under Section 564(b)(1) of the Act, 21 U.S.C. section 360bbb-3(b)(1), unless the authorization is terminated or revoked sooner.  Performed at [facility], [place] [address], [place], [place] [address]          Radiology Studies: No results found.      Scheduled Meds: . dronedarone  400 mg Oral BID WC  . ezetimibe  10 mg Oral Daily  . feeding supplement  1 Container Oral BID BM  . insulin aspart  0-9 Units Subcutaneous Q4H  . magnesium oxide  400 mg Oral Daily  . sodium chloride flush  3 mL Intravenous Once   Continuous Infusions: . sodium chloride 100 mL/hr at 08/19/19 0212  . ciprofloxacin 400 mg (08/18/19 2212)  . metronidazole 500 mg (08/19/19 0301)          Aline August, MD [facility] 08/19/2019, 10:48 AM

## 2019-08-20 LAB — GLUCOSE, CAPILLARY
Glucose-Capillary: 119 mg/dL — ABNORMAL HIGH (ref 70–99)
Glucose-Capillary: 133 mg/dL — ABNORMAL HIGH (ref 70–99)

## 2019-08-20 LAB — BASIC METABOLIC PANEL
Anion gap: 10 (ref 5–15)
BUN: 14 mg/dL (ref 8–23)
CO2: 23 mmol/L (ref 22–32)
Calcium: 8.9 mg/dL (ref 8.9–10.3)
Chloride: 106 mmol/L (ref 98–111)
Creatinine, Ser: 1.57 mg/dL — ABNORMAL HIGH (ref 0.61–1.24)
GFR calc Af Amer: 51 mL/min — ABNORMAL LOW (ref >60)
GFR calc non Af Amer: 44 mL/min — ABNORMAL LOW (ref >60)
Glucose, Bld: 124 mg/dL — ABNORMAL HIGH (ref 70–99)
Potassium: 4 mmol/L (ref 3.5–5.1)
Sodium: 139 mmol/L (ref 135–145)

## 2019-08-20 LAB — CBC WITH DIFFERENTIAL/PLATELET
Abs Immature Granulocytes: 0.05 10*3/uL (ref 0.00–0.07)
Basophils Absolute: 0.1 10*3/uL (ref 0.0–0.1)
Basophils Relative: 1 %
Eosinophils Absolute: 0.4 10*3/uL (ref 0.0–0.5)
Eosinophils Relative: 5 %
HCT: 38.2 % — ABNORMAL LOW (ref 39.0–52.0)
Hemoglobin: 12.6 g/dL — ABNORMAL LOW (ref 13.0–17.0)
Immature Granulocytes: 1 %
Lymphocytes Relative: 29 %
Lymphs Abs: 2.2 10*3/uL (ref 0.7–4.0)
MCH: 30.4 pg (ref 26.0–34.0)
MCHC: 33 g/dL (ref 30.0–36.0)
MCV: 92.3 fL (ref 80.0–100.0)
Monocytes Absolute: 0.8 10*3/uL (ref 0.1–1.0)
Monocytes Relative: 10 %
Neutro Abs: 4.2 10*3/uL (ref 1.7–7.7)
Neutrophils Relative %: 54 %
Platelets: 284 10*3/uL (ref 150–400)
RBC: 4.14 MIL/uL — ABNORMAL LOW (ref 4.22–5.81)
RDW: 12.2 % (ref 11.5–15.5)
WBC: 7.6 10*3/uL (ref 4.0–10.5)
nRBC: 0 % (ref 0.0–0.2)

## 2019-08-20 LAB — MAGNESIUM: Magnesium: 2 mg/dL (ref 1.7–2.4)

## 2019-08-20 MED ORDER — TRAMADOL HCL 50 MG PO TABS: 50.0000 mg | ORAL_TABLET | Freq: Four times a day (QID) | ORAL | 0 refills | Status: DC | PRN

## 2019-08-20 MED ORDER — METRONIDAZOLE 500 MG PO TABS
500.0000 mg | ORAL_TABLET | Freq: Three times a day (TID) | ORAL | 0 refills | Status: AC
Start: 2019-08-20 — End: 2019-08-26

## 2019-08-20 MED ORDER — CIPROFLOXACIN HCL 500 MG PO TABS
500.0000 mg | ORAL_TABLET | Freq: Two times a day (BID) | ORAL | 0 refills | Status: AC
Start: 2019-08-20 — End: 2019-08-26

## 2019-08-20 NOTE — Progress Notes (Signed)
[facility] Progress Note     Subjective: CC-  Continues to make improvements. States that he has very mild LLQ discomfort at times, but it is better. Tolerating solid food without increased pain or n/v. BM last night.  Objective: Vital signs in last 24 hours: Temp:  [98.3 F (36.8 C)-98.6 F (37 C)] 98.5 F (36.9 C) (07/15 0416) Pulse Rate:  [58-70] 58 (07/15 0416) Resp:  [18] 18 (07/15 0416) BP: (122-131)/(70-84) 122/70 (07/15 0416) SpO2:  [96 %-97 %] 96 % (07/14 2017) Last BM Date: 08/19/19  Intake/Output from previous day: 07/14 0701 - 07/15 0700 In: 100 [IV Piggyback:100] Out: 1400 [Urine:1400] Intake/Output this shift: No intake/output data recorded.  PE: Gen: Alert, NAD, pleasant HEENT: EOM's intact, pupils equal and round Card: RRR Pulm: CTAB on anterior exam, no W/R/R, rate and effort normal Abd: soft,mild distension, +BS, no HSM,minimal LLQ TTP without rebound or guarding/ no peritonitis Psych: A&Ox4  Skin: no rashes noted, warm and dry    Lab Results:  Recent Labs    08/19/19 0255 08/20/19 0346  WBC 8.7 7.6  HGB 12.4* 12.6*  HCT 37.4* 38.2*  PLT 262 284   BMET Recent Labs    08/19/19 0255 08/20/19 0346  NA 137 139  K 3.8 4.0  CL 105 106  CO2 23 23  GLUCOSE 128* 124*  BUN 12 14  CREATININE 1.40* 1.57*  CALCIUM 8.7* 8.9   PT/INR No results for input(s): LABPROT, INR in the last 72 hours. CMP     Component Value Date/Time   NA 139 08/20/2019 0346   K 4.0 08/20/2019 0346   CL 106 08/20/2019 0346   CO2 23 08/20/2019 0346   GLUCOSE 124 (H) 08/20/2019 0346   BUN 14 08/20/2019 0346   CREATININE 1.57 (H) 08/20/2019 0346   CREATININE 1.23 03/23/2015 1010   CALCIUM 8.9 08/20/2019 0346   PROT 6.9 08/16/2019 1626   ALBUMIN 3.3 (L) 08/19/2019 0255   AST 31 08/16/2019 1626   ALT 30 08/16/2019 1626   ALKPHOS 51 08/16/2019 1626   BILITOT 1.2 08/16/2019 1626   GFRNONAA 44 (L) 08/20/2019 0346   GFRAA 51 (L) 08/20/2019 0346    Lipase     Component Value Date/Time   LIPASE 58 (H) 08/16/2019 1626       Studies/Results: No results found.  Anti-infectives: Anti-infectives (From admission, onward)   Start     Dose/Rate Route Frequency Ordered Stop   08/17/19 0800  metroNIDAZOLE (FLAGYL) IVPB 500 mg     Discontinue     500 mg 100 mL/hr over 60 Minutes Intravenous Every 8 hours 08/17/19 0034     08/16/19 2245  ciprofloxacin (CIPRO) IVPB 400 mg     Discontinue     400 mg 200 mL/hr over 60 Minutes Intravenous Every 12 hours 08/16/19 2241     08/16/19 2245  metroNIDAZOLE (FLAGYL) IVPB 500 mg        500 mg 100 mL/hr over 60 Minutes Intravenous  Once 08/16/19 2241 08/17/19 0116       Assessment/Plan DM - A1c 7.6 Paroxysmal atrial flutter/fibrillation on eliquis (last dose 7/11 in PM) CAD with stents HTN HLD CKD stage III Hx diverticulitis s/p Hartmans followed by colostomy reversal ~20 years ago  Small bowel diverticulitis with small focus extraluminal air -CT 7/11 shows inflammatory changes in the left lower quadrant surrounding multiple loops of small bowelwithextraluminal air without evidence for a large abscess,concerning for acute complicated small-bowel diverticulitis; colonic diverticulosis without CT evidence  for colonic diverticulitis  ID -cipro/flagyl 7/11>>day#4 FEN -IVF, soft diet, Boost VTE -SCDs, hold eliquis but ok for chemical DVT prophylaxis or IV heparin if needed Foley -none Follow up -no surgical follow up needed  Plan- Stable for discharge from surgical standpoint.  He should transition to oral cipro/flagyl at discharge to complete a total of 10 days of antibiotics. Ok to restart eliquis at discharge. No surgical follow up needed.    LOS: 4 days    [facility] 08/20/2019, 7:56 AM Please see Amion for pager number during day hours 7:00am-4:30pm

## 2019-08-20 NOTE — Plan of Care (Signed)

## 2019-08-20 NOTE — Discharge Summary (Signed)
Physician Discharge Summary  Shawn Hernandez IWL:[id number] DOB: [birth year] DOA: 08/16/2019  PCP: [name], MD  Admit date: 08/16/2019 Discharge date: 08/20/2019  Admitted From: Home Disposition: Home  Recommendations for Outpatient Follow-up:  1. Follow up with PCP in 1 week with repeat CBC/BMP 2. Recommend outpatient follow-up with GI/Dr. Benson Norway in the next few weeks 3. Follow up in ED if symptoms worsen or new appear   Home Health: No Equipment/Devices: None  Discharge Condition: Stable CODE STATUS: Full Diet recommendation: Heart healthy/carb modified  Brief/Interim Summary: 69 year old male with history of paroxysmal A. fib/flutter on Eliquis status post atrial flutter ablation on 10/17/2017, CAD status post BMS, diabetes mellitus type 2, chronic renal disease stage III, hypertension, hyperlipidemia, psoriatic arthritis, bowel resection 20 years ago due to diverticulitis presented with left lower quadrant abdominal pain.  He was admitted with acute diverticulitis with findings of extraluminal air.  He was started on IV antibiotics.  General surgery was consulted.  During the hospitalization, his general condition has gradually improved.  Diet has been advanced as well.  He is tolerating diet with no worsening abdominal pain or vomiting.  He had a bowel movement last night.  General surgery has cleared the patient for discharge.  He will be discharged on oral ciprofloxacin and Flagyl.   Discharge Diagnoses:   Acute diverticulitis of small bowel with small focus of extraluminal air -Improving.  Currently on intravenous ciprofloxacin and Flagyl.  General surgery following.   No need for surgical intervention as per general surgery. -Diet has been advanced as well.  He is tolerating diet with no worsening abdominal pain or vomiting.  He is afebrile with no leukocytosis.  He had a bowel movement last night.  General surgery has cleared the patient for discharge.  He will be  discharged on oral ciprofloxacin and Flagyl to finish 10-day course of therapy.  No outpatient surgical follow-up needed as per general surgery. -Outpatient follow-up with PCP.  Consider GI evaluation with Dr. Benson Norway as an outpatient in the next few weeks.   Paroxysmal atrial flutter/fibrillation -Status post flutter ablation on 10/17/2017 by Dr. Curt Bears -Currently rate controlled.  Continue dronedarone and magnesium oxide.  Resume Eliquis on discharge  Chronic kidney disease stage IIIb -Creatinine stable.  Outpatient follow-up  CAD status post BMS -Stable.  Resume Eliquis on discharge.  Continue Zetia and fenofibrate  Diabetes mellitus type 2 uncontrolled with hyperglycemia  -A1c 7.6  -Resume oral meds and home regimen.  Carb modified diet.  Outpatient follow-up.  Hypertension -Blood pressure stable.    Resume oral regimen.  Hyperlipidemia -Continue Zetia, fenofibrate  Normocytic anemia -Hemoglobin stable   Discharge Instructions  Discharge Instructions    Diet - low sodium heart healthy   Complete by: As directed    Diet Carb Modified   Complete by: As directed    Increase activity slowly   Complete by: As directed      Allergies as of 08/20/2019      Reactions   Ivp Dye [iodinated Diagnostic Agents] Hives   Lisinopril Other (See Comments)   cough   Statins Other (See Comments)   myalgias   Iodine Rash      Medication List    TAKE these medications   apixaban 5 MG Tabs tablet Commonly known as: ELIQUIS Take 1 tablet (5 mg total) by mouth 2 (two) times daily.   blood glucose meter kit and supplies Dispense based on patient and insurance preference. Use up to four times daily as directed. (  FOR ICD-10 E11.10).   ciprofloxacin 500 MG tablet Commonly known as: Cipro Take 1 tablet (500 mg total) by mouth 2 (two) times daily for 6 days.   dronedarone 400 MG tablet Commonly known as: MULTAQ Take 1 tablet (400 mg total) by mouth 2 (two) times daily with a  meal.   ezetimibe 10 MG tablet Commonly known as: ZETIA TAKE 1 TABLET BY MOUTH EVERY DAY   fenofibrate 145 MG tablet Commonly known as: TRICOR TAKE 1 TABLET BY MOUTH EVERY DAY   Magnesium 400 MG Tabs Take 1 tablet by mouth daily.   metFORMIN 500 MG 24 hr tablet Commonly known as: Glucophage XR Take 1 tablet (500 mg total) by mouth 2 (two) times daily with a meal.   metroNIDAZOLE 500 MG tablet Commonly known as: Flagyl Take 1 tablet (500 mg total) by mouth 3 (three) times daily for 6 days.   olmesartan 5 MG tablet Commonly known as: BENICAR Take 5 mg by mouth daily.   traMADol 50 MG tablet Commonly known as: Ultram Take 1 tablet (50 mg total) by mouth every 6 (six) hours as needed for moderate pain.   Trulicity 9.73 ZH/2.9JM Sopn Generic drug: Dulaglutide Inject 0.75 mg into the skin every Friday.       Follow-up Information    [name], MD. Schedule an appointment as soon as possible for a visit in 1 week(s).   Specialty: Internal Medicine Why: with cbc/bmp Contact information: [address] [place] [address] [phone]        Constance Haw, MD .   Specialty: Cardiology Contact information: [address] [place] [address] [place] [address] [phone]        Martinique, Peter M, MD .   Specialty: Cardiology Contact information: [address] [place] [address] [phone]              Allergies  Allergen Reactions  . Ivp Dye [Iodinated Diagnostic Agents] Hives  . Lisinopril Other (See Comments)    cough  . Statins Other (See Comments)    myalgias  . Iodine Rash    Consultations:  General surgery   Procedures/Studies: CT ABDOMEN PELVIS WO CONTRAST  Result Date: 08/16/2019 CLINICAL DATA:  Left lower quadrant abdominal pain. EXAM: CT ABDOMEN AND PELVIS WITHOUT CONTRAST TECHNIQUE: Multidetector CT imaging of the abdomen and pelvis was performed following the standard protocol without  IV contrast. COMPARISON:  April 26, 2017. FINDINGS: Lower chest: The lung bases are clear. The heart size is normal. Hepatobiliary: There is decreased hepatic attenuation suggestive of hepatic steatosis. Normal gallbladder.There is no biliary ductal dilation. Pancreas: Normal contours without ductal dilatation. No peripancreatic fluid collection. Spleen: Unremarkable. Adrenals/Urinary Tract: --Adrenal glands: Unremarkable. --Right kidney/ureter: Multiple nonobstructing stones are noted measuring up to approximately 7 mm in the lower pole. --Left kidney/ureter: Multiple nonobstructing stones are noted measuring up to approximately 5 mm in the lower pole. --Urinary bladder: Unremarkable. Stomach/Bowel: --Stomach/Duodenum: There is a posterior gastric diverticulum. --Small bowel: There are inflammatory changes in the left lower quadrant surrounding multiple loops of small bowel. Many of the small bowel loops demonstrate prominent diverticula. There is extraluminal air without evidence for a large abscess. There is no bowel obstruction. --Colon: There is a rectosigmoid anastomosis that is patent. There is no evidence for colonic diverticulitis. --Appendix: Normal. Vascular/Lymphatic: Atherosclerotic calcification is present within the non-aneurysmal abdominal aorta, without hemodynamically significant stenosis. --No retroperitoneal lymphadenopathy. --No mesenteric lymphadenopathy. --No pelvic or inguinal lymphadenopathy. Reproductive: Unremarkable Other:  No ascites or free air. The abdominal wall is normal. Musculoskeletal. No acute displaced fractures. IMPRESSION: 1. Inflammatory changes in the left lower quadrant surrounding multiple loops of small bowel. There is extraluminal air without evidence for a large abscess. Findings are concerning for acute complicated small-bowel diverticulitis. 2. Colonic diverticulosis without CT evidence for colonic diverticulitis. 3. Bilateral nonobstructing nephrolithiasis. 4. Hepatic  steatosis. Aortic Atherosclerosis (ICD10-I70.0). Electronically Signed   By: Constance Holster M.D.   On: 08/16/2019 22:10       Subjective: Patient seen and examined at bedside.  He feels better and wants to go home.  Denies worsening abdominal pain.  Tolerating diet and had a bowel movement yesterday.  No overnight fevers.  Discharge Exam: Vitals:   08/19/19 2017 08/20/19 0416  BP: 125/84 122/70  Pulse: 68 (!) 58  Resp: 18 18  Temp: 98.6 F (37 C) 98.5 F (36.9 C)  SpO2: 96%     General: Pt is alert, awake, not in acute distress Cardiovascular: Mildly bradycardic intermittently, S1/S2 + Respiratory: bilateral decreased breath sounds at bases Abdominal: Soft, very minimal left lower quadrant tenderness present, ND, bowel sounds + Extremities: no edema, no cyanosis    The results of significant diagnostics from this hospitalization (including imaging, microbiology, ancillary and laboratory) are listed below for reference.     Microbiology: Recent Results (from the past 240 hour(s))  SARS Coronavirus 2 by RT PCR (hospital order, performed in [facility] hospital lab) Nasopharyngeal Nasopharyngeal Swab     Status: None   Collection Time: 08/16/19 11:39 PM   Specimen: Nasopharyngeal Swab  Result Value Ref Range Status   SARS Coronavirus 2 NEGATIVE NEGATIVE Final    Comment: (NOTE) SARS-CoV-2 target nucleic acids are NOT DETECTED.  The SARS-CoV-2 RNA is generally detectable in upper and lower respiratory specimens during the acute phase of infection. The lowest concentration of SARS-CoV-2 viral copies this assay can detect is 250 copies / mL. A negative result does not preclude SARS-CoV-2 infection and should not be used as the sole basis for treatment or other patient management decisions.  A negative result may occur with improper specimen collection / handling, submission of specimen other than nasopharyngeal swab, presence of viral mutation(s) within the areas  targeted by this assay, and inadequate number of viral copies (<250 copies / mL). A negative result must be combined with clinical observations, patient history, and epidemiological information.  Fact Sheet for Patients:   StrictlyIdeas.no  Fact Sheet for Healthcare Providers: BankingDealers.co.za  This test is not yet approved or  cleared by the [place] FDA and has been authorized for detection and/or diagnosis of SARS-CoV-2 by FDA under an Emergency Use Authorization (EUA).  This EUA will remain in effect (meaning this test can be used) for the duration of the COVID-19 declaration under Section 564(b)(1) of the Act, 21 U.S.C. section 360bbb-3(b)(1), unless the authorization is terminated or revoked sooner.  Performed at [facility], [place] [address] [place], [place] [address]      Labs: BNP (last 3 results) No results for input(s): BNP in the last 8760 hours. Basic Metabolic Panel: Recent Labs  Lab 08/16/19 1626 08/17/19 0452 08/18/19 0739 08/19/19 0255 08/20/19 0346  NA 136 137 137 137 139  K 4.3 3.5 3.9 3.8 4.0  CL 102 105 104 105 106  CO2 '23 22 22 23 23  ' GLUCOSE 155* 130* 115* 128* 124*  BUN '15 16 15 12 14  ' CREATININE 1.73* 1.68* 1.55* 1.40* 1.57*  CALCIUM  9.4 8.7* 8.7* 8.7* 8.9  MG  --   --  1.9 2.0 2.0  PHOS  --   --  3.2 3.0  --    Liver Function Tests: Recent Labs  Lab 08/16/19 1626 08/18/19 0739 08/19/19 0255  AST 31  --   --   ALT 30  --   --   ALKPHOS 51  --   --   BILITOT 1.2  --   --   PROT 6.9  --   --   ALBUMIN 4.2 3.3* 3.3*   Recent Labs  Lab 08/16/19 1626  LIPASE 58*   No results for input(s): AMMONIA in the last 168 hours. CBC: Recent Labs  Lab 08/16/19 1626 08/17/19 0452 08/18/19 0739 08/19/19 0255 08/20/19 0346  WBC 19.3* 13.7* 10.3 8.7 7.6  NEUTROABS  --   --  6.9 5.0 4.2  HGB 15.0 13.2 12.8* 12.4* 12.6*  HCT 43.9 38.4* 38.4* 37.4* 38.2*  MCV 91.5 91.2 91.9 92.3  92.3  PLT 322 268 250 262 284   Cardiac Enzymes: No results for input(s): CKTOTAL, CKMB, CKMBINDEX, TROPONINI in the last 168 hours. BNP: Invalid input(s): POCBNP CBG: Recent Labs  Lab 08/19/19 1546 08/19/19 2015 08/19/19 2353 08/20/19 0414 08/20/19 0734  GLUCAP 149* 176* 145* 119* 133*   D-Dimer No results for input(s): DDIMER in the last 72 hours. Hgb A1c No results for input(s): HGBA1C in the last 72 hours. Lipid Profile No results for input(s): CHOL, HDL, LDLCALC, TRIG, CHOLHDL, LDLDIRECT in the last 72 hours. Thyroid function studies No results for input(s): TSH, T4TOTAL, T3FREE, THYROIDAB in the last 72 hours.  Invalid input(s): FREET3 Anemia work up Recent Labs    08/18/19 1500  TIBC 286  IRON 25*   Urinalysis    Component Value Date/Time   COLORURINE YELLOW 08/16/2019 [place] 08/16/2019 2204   LABSPEC 1.017 08/16/2019 2204   PHURINE 5.0 08/16/2019 2204   GLUCOSEU NEGATIVE 08/16/2019 2204   HGBUR NEGATIVE 08/16/2019 [place] NEGATIVE 08/16/2019 2204   KETONESUR NEGATIVE 08/16/2019 2204   PROTEINUR NEGATIVE 08/16/2019 2204   NITRITE NEGATIVE 08/16/2019 2204   LEUKOCYTESUR NEGATIVE 08/16/2019 2204   Sepsis Labs Invalid input(s): PROCALCITONIN,  WBC,  LACTICIDVEN Microbiology Recent Results (from the past 240 hour(s))  SARS Coronavirus 2 by RT PCR (hospital order, performed in [place] hospital lab) Nasopharyngeal Nasopharyngeal Swab     Status: None   Collection Time: 08/16/19 11:39 PM   Specimen: Nasopharyngeal Swab  Result Value Ref Range Status   SARS Coronavirus 2 NEGATIVE NEGATIVE Final    Comment: (NOTE) SARS-CoV-2 target nucleic acids are NOT DETECTED.  The SARS-CoV-2 RNA is generally detectable in upper and lower respiratory specimens during the acute phase of infection. The lowest concentration of SARS-CoV-2 viral copies this assay can detect is 250 copies / mL. A negative result does not preclude SARS-CoV-2  infection and should not be used as the sole basis for treatment or other patient management decisions.  A negative result may occur with improper specimen collection / handling, submission of specimen other than nasopharyngeal swab, presence of viral mutation(s) within the areas targeted by this assay, and inadequate number of viral copies (<250 copies / mL). A negative result must be combined with clinical observations, patient history, and epidemiological information.  Fact Sheet for Patients:   StrictlyIdeas.no  Fact Sheet for Healthcare Providers: BankingDealers.co.za  This test is not yet approved or  cleared by the [place] FDA  and has been authorized for detection and/or diagnosis of SARS-CoV-2 by FDA under an Emergency Use Authorization (EUA).  This EUA will remain in effect (meaning this test can be used) for the duration of the COVID-19 declaration under Section 564(b)(1) of the Act, 21 U.S.C. section 360bbb-3(b)(1), unless the authorization is terminated or revoked sooner.  Performed at [facility], [place] [address] [place], [place] [address]      Time coordinating discharge: 35 minutes  SIGNED:   Aline August, MD  [facility] 08/20/2019, 8:33 AM

## 2019-08-20 NOTE — Progress Notes (Signed)
Patient discharged to home. Verbalizes understanding of all discharge instructions including discharge medications and follow up MD visits. Patient awaiting ride arrival.  

## 2019-08-25 ENCOUNTER — Telehealth: Payer: Self-pay | Admitting: Cardiology

## 2019-08-25 NOTE — Telephone Encounter (Signed)
Pt called and had questions about medications and whether it would be a good time to set up an appt with the doctor. Please to discuss

## 2019-08-25 NOTE — Telephone Encounter (Signed)
Returned call to patient no answer.LMTC. 

## 2019-08-25 NOTE — Telephone Encounter (Signed)
Patient returning call.

## 2019-08-26 ENCOUNTER — Telehealth: Payer: Self-pay | Admitting: Cardiology

## 2019-08-26 DIAGNOSIS — I2510 Atherosclerotic heart disease of native coronary artery without angina pectoris: Secondary | ICD-10-CM | POA: Diagnosis not present

## 2019-08-26 NOTE — Telephone Encounter (Signed)
**Note De-Identified Ranger Petrich Obfuscation** No answer so I left a detailed message on the pts VM stating that if he is applying for assistance for a cardiac med that Dr Curt Bears prescribed to please complete his part of the application, obtain required documents per the program he is applying for asst with and to bring all to the office to drop off and we will handle the provider part and will fax all to the Pt Asst program he is applying with.  I did leave my name and [facility]'s phone number so he can call me back if he has any questions.

## 2019-08-26 NOTE — Telephone Encounter (Signed)
Patient calling to discuss the patient assistant program for his medications. He would like to know if he should bring the applications to the office.

## 2019-08-27 NOTE — Telephone Encounter (Signed)
Per pt has addressed this message Pt is in process of seeing if qualifies for assistance for Eliquis  Per pt is in donut hole  currently and med is going to be $300.00 per month and pt notes has another pricey med as well and can't afford both Informed pt that may need to consider Warfarin for alternative therapy Pt is dropping off application today at [address] office today .Adonis Housekeeper

## 2019-09-03 DIAGNOSIS — E1165 Type 2 diabetes mellitus with hyperglycemia: Secondary | ICD-10-CM | POA: Diagnosis not present

## 2019-09-03 DIAGNOSIS — K5790 Diverticulosis of intestine, part unspecified, without perforation or abscess without bleeding: Secondary | ICD-10-CM | POA: Diagnosis not present

## 2019-09-03 DIAGNOSIS — E1169 Type 2 diabetes mellitus with other specified complication: Secondary | ICD-10-CM | POA: Diagnosis not present

## 2019-09-03 DIAGNOSIS — I2510 Atherosclerotic heart disease of native coronary artery without angina pectoris: Secondary | ICD-10-CM | POA: Diagnosis not present

## 2019-09-09 ENCOUNTER — Telehealth: Payer: Self-pay | Admitting: Cardiology

## 2019-09-09 NOTE — Telephone Encounter (Signed)
Follow up   Pt returning call from [place], he said he will wait for her call tomorrow

## 2019-09-09 NOTE — Telephone Encounter (Signed)
Warfarin can be substituted, though more difficult to take as needs to be closely monitored. Would recommend Eliquis, but if financial issues can switch. Would need to be referred to coumadin clinic if wishes to be switched.

## 2019-09-09 NOTE — Telephone Encounter (Signed)
It appears patient is already on Eliquis. Lyn had tried to reach about pt assistance. Lyn can you reach back out to patient?

## 2019-09-09 NOTE — Telephone Encounter (Signed)
Pt c/o medication issue:  1. Name of Medication: jantoven   2. How are you currently taking this medication (dosage and times per day)? Pt not taking this medication   3. Are you having a reaction (difficulty breathing--STAT)? n/a  4. What is your medication issue? Pt wanted to know if this medication could be substituted for Eliquis.

## 2019-09-09 NOTE — Telephone Encounter (Signed)
Attempted phone call to pt and left voicemail to contact triage RN at [phone].

## 2019-09-09 NOTE — Telephone Encounter (Addendum)
**Note De-Identified Shawn Hernandez Obfuscation** No answer so I left a detailed message on the pts VM asking him to call Jeani Hawking at [facility] at [phone] concerning his Eliquis/Warfarin. I did advise the pt in the VM that I will be clocking out today at 3 pm and will be back in the office tomorrow morning so if he calls back after that time to either leave me a message with the operator or to ask to s/w with a triage nurse.

## 2019-09-10 NOTE — Telephone Encounter (Signed)
**Note De-Identified Corby Villasenor Obfuscation** No answer so I left a message on the pts VM asking the pt to call Jeani Hawking back at [facility] at [phone].

## 2019-09-11 NOTE — Telephone Encounter (Signed)
**Note De-Identified Thaxton Pelley Obfuscation** No answer so I left another message on the pts VM asking him to call Jeani Hawking at [facility] at [phone].

## 2019-09-15 ENCOUNTER — Telehealth: Payer: Self-pay

## 2019-09-15 NOTE — Telephone Encounter (Addendum)
**Note De-Identified Shawn Hernandez Obfuscation** Letter received from [employer] Pt Asst program stating that they approved the pt for asst with his Multaq for the remainder of this year. The letter states that they have also notified the pt oft his approval as well.

## 2019-09-16 ENCOUNTER — Other Ambulatory Visit: Payer: Self-pay | Admitting: Cardiology

## 2019-09-16 ENCOUNTER — Telehealth: Payer: Self-pay | Admitting: Cardiology

## 2019-09-16 NOTE — Telephone Encounter (Signed)
**Note De-Identified Phineas Mcenroe Obfuscation** No answer so I left a message on the pts VM asking him to call Jeani Hawking at [facility] at [phone].

## 2019-09-16 NOTE — Telephone Encounter (Signed)
Shawn Hernandez is returning [name]'s call in regards to both medication and the letter received. Please advise.

## 2019-09-16 NOTE — Telephone Encounter (Signed)
**Note De-Identified Shawn Hernandez Obfuscation** Per the pt this is in reference to the [employer] Pt Asst application for Eliquis we faxed in for him in 01/2019.  He states that [employer] reached out to him recently requesting his 2020 proof of income and that he mailed that to them last week.  He  States that he received a letter from them today requesting his 2021 out of pocket expense report from his pharmacy. He states that he will get that report from his pharmacy and will mail/fax it to [facility] ASAP ( I offered to fax for him but he declined my offer).  He reports that he has 15 Eliquis tablets on hand at this time and is aware that if this is not handled by the time he is down to 3 days worth of Eliquis 5mg  tabltes to call the office and if we have samples we ill give him some.  He was unaware of his [employer] pt asst approval so I read the approval letter we received to him.  He thanked me for calling him back and is aware that we will contact him when his Multaq arrives in the office

## 2019-09-17 ENCOUNTER — Telehealth: Payer: Self-pay | Admitting: *Deleted

## 2019-09-17 DIAGNOSIS — R3121 Asymptomatic microscopic hematuria: Secondary | ICD-10-CM | POA: Diagnosis not present

## 2019-09-17 DIAGNOSIS — N400 Enlarged prostate without lower urinary tract symptoms: Secondary | ICD-10-CM | POA: Diagnosis not present

## 2019-09-17 NOTE — Telephone Encounter (Addendum)
Called pt and left message informing pt that his medication Multaq was at the office ready for him to pick up, 3 bottles of 60 tablets, from [employer], patient assistance program and if he has any other problems, questions or concerns, to give our office a call. FYI

## 2019-09-17 NOTE — Telephone Encounter (Signed)
A detailed message was left,re: his follow up visit. °

## 2019-09-23 ENCOUNTER — Telehealth: Payer: Self-pay | Admitting: Cardiology

## 2019-09-23 NOTE — Telephone Encounter (Signed)
LVM for patient to return call to get follow up scheduled with Jordan from recall list 

## 2019-09-25 ENCOUNTER — Telehealth: Payer: Self-pay | Admitting: Cardiology

## 2019-09-25 NOTE — Telephone Encounter (Signed)
Pt aware will leave 1 weeks worth of samples at front dest for pick up /cy

## 2019-09-25 NOTE — Telephone Encounter (Signed)
Patient states he is returning a call to [place] regarding samples for Eliquis. Please call back.

## 2019-09-25 NOTE — Telephone Encounter (Signed)
Spoke with patient regarding scheduling recall for Dr. Martinique, he states he wants to get the issue settled with his medications first before scheduling.

## 2019-10-02 ENCOUNTER — Other Ambulatory Visit: Payer: Self-pay | Admitting: Cardiology

## 2019-10-06 NOTE — Telephone Encounter (Signed)
Called and spoke with pt, notified samples would be left in the front office for him to pick up at his earliest convenience.  Pt states he had been having trouble with [employer]. Will send to primary RN. Pt verbalized understanding with no other questions at this time.

## 2019-10-06 NOTE — Telephone Encounter (Signed)
Patient calling the office for samples of medication:   1.  What medication and dosage are you requesting samples for? Eliquis 5 MG  2.  Are you currently out of this medication?   Patient states he has a 1 day supply of medication remaining.

## 2019-10-19 DIAGNOSIS — K314 Gastric diverticulum: Secondary | ICD-10-CM | POA: Diagnosis not present

## 2019-10-19 DIAGNOSIS — N200 Calculus of kidney: Secondary | ICD-10-CM | POA: Diagnosis not present

## 2019-10-19 DIAGNOSIS — R3121 Asymptomatic microscopic hematuria: Secondary | ICD-10-CM | POA: Diagnosis not present

## 2019-10-19 DIAGNOSIS — N281 Cyst of kidney, acquired: Secondary | ICD-10-CM | POA: Diagnosis not present

## 2019-10-20 ENCOUNTER — Other Ambulatory Visit: Payer: Self-pay | Admitting: Physician Assistant

## 2019-10-20 NOTE — Telephone Encounter (Signed)
This is Dr. Jordan's pt. °

## 2019-10-21 ENCOUNTER — Telehealth: Payer: Self-pay

## 2019-10-21 NOTE — Telephone Encounter (Signed)
**Note De-Identified Tagen Brethauer Obfuscation** Letter received from [employer] stating that they have approved the pt for assistance with his Eliquis. Approval is valid until 02/05/2020. Application Case#: [id number]  The letter states that they have notified the pt of this approval as well.

## 2019-10-23 ENCOUNTER — Telehealth: Payer: Self-pay | Admitting: Cardiology

## 2019-10-23 NOTE — Telephone Encounter (Signed)
Pt wants to know if the office have any sample packs of eliquis pills. Please call

## 2019-10-23 NOTE — Telephone Encounter (Signed)
Patient aware and will pick up today at [facility].

## 2019-10-23 NOTE — Telephone Encounter (Signed)
I can place 2 boxes at the front desk for the patient.

## 2019-10-23 NOTE — Telephone Encounter (Signed)
Returned call to patient, no samples at [employer] office.  He states he gets Eliquis through patient assistance and there is a delay.  He only has one more dose of Eliquis.   He sees Dr. Curt Bears as well and wondering if their office has any.  Advised would see if they have any samples and let him know.

## 2019-11-05 NOTE — Progress Notes (Signed)
Cardiology Office Note   Date:  11/06/2019   ID:  Shawn Hernandez, DOB Jun 12, 1950, MRN [id number]  PCP:  [name], MD  Cardiologist:   Lanika Colgate [place], MD   Chief Complaint  Patient presents with  . Coronary Artery Disease      History of Present Illness: Shawn Hernandez is a 69 y.o. male who presents for follow up CAD and atrial flutter. He has a history of coronary artery disease with 2 bare-metal stents to the OM in 2010 with residual 50% LAD and 70% RCA stenosis, hyperlipidemia with statin intolerance, psoriatic arthritis, obesity, paroxysmal atrial flutter, and diabetes.  He had an atrial flutter ablation on 10/18/2017. On follow up was noted to have paroxysmal Afib and was placed on Multaq.   He was admitted in July with episode of diverticulitis. Managed with antibiotics.   On follow up he reports he has had some hematuria and is being evaluated by urology. He notes episodes of Afib about 3-4 times a month. It is uncomfortable but doesn't last long. States he just feels uncomfortable all over. He is having a lot of balance and dizziness.   Past Medical History:  Diagnosis Date  . Allergy to iodine   . Arthritis    discomfort in hands and arms  . Atrial flutter, paroxysmal ([place])   . Coronary artery disease   . Diabetes type 2, controlled ([place]) 10/2016  . Diverticulosis   . Hypercholesterolemia   . Hypertension   . Psoriatic arthritis ([place])   . Sinusitis     Past Surgical History:  Procedure Laterality Date  . A-FLUTTER ABLATION N/A 10/17/2017   Procedure: A-FLUTTER ABLATION;  Surgeon: Constance Haw, MD;  Location: [facility];  Service: Cardiovascular;  Laterality: N/A;  . COLON SURGERY  2001   2 feet removed re- diverticulosis  . CORONARY STENT PLACEMENT  2011   sequential bare-metal  . INGUINAL HERNIA REPAIR    . OTHER SURGICAL HISTORY     appendectomy     Current Outpatient Medications  Medication Sig Dispense Refill  . apixaban  (ELIQUIS) 5 MG TABS tablet Take 1 tablet (5 mg total) by mouth 2 (two) times daily. 60 tablet 5  . blood glucose meter kit and supplies Dispense based on patient and insurance preference. Use up to four times daily as directed. (FOR ICD-10 E11.10). 1 each 0  . dronedarone (MULTAQ) 400 MG tablet Take 1 tablet (400 mg total) by mouth 2 (two) times daily with a meal. Please schedule appt for future refills. 1st attempt 60 tablet 0  . ezetimibe (ZETIA) 10 MG tablet TAKE 1 TABLET BY MOUTH EVERY DAY (Patient taking differently: Take 10 mg by mouth daily. ) 90 tablet 0  . fenofibrate (TRICOR) 145 MG tablet TAKE 1 TABLET BY MOUTH EVERY DAY 30 tablet 0  . glimepiride (AMARYL) 4 MG tablet Take 4 mg by mouth daily with breakfast.    . Magnesium 400 MG TABS Take 1 tablet by mouth daily.     . metFORMIN (GLUCOPHAGE-XR) 500 MG 24 hr tablet Take 500 mg by mouth daily with breakfast.    . olmesartan (BENICAR) 5 MG tablet Take 5 mg by mouth daily.    . traMADol (ULTRAM) 50 MG tablet Take 1 tablet (50 mg total) by mouth every 6 (six) hours as needed for moderate pain. 14 tablet 0  . TRULICITY 2.24 MG/5.0IB SOPN Inject 0.75 mg into the skin every Friday.     Marland Kitchen  rosuvastatin (CRESTOR) 5 MG tablet Take 1 tablet (5 mg total) by mouth daily. 90 tablet 3   No current facility-administered medications for this visit.    Allergies:   Ivp dye [iodinated diagnostic agents], Lisinopril, Statins, and Iodine    Social History:  The patient  reports that he has never smoked. He has never used smokeless tobacco.   Family History:  The patient's family history includes Diabetes in his brother; Heart disease in his brother, father, and mother.    ROS:  Please see the history of present illness.   Otherwise, review of systems are positive for none.   All other systems are reviewed and negative.    PHYSICAL EXAM: VS:  BP 132/62   Pulse 63   Ht 6' (1.829 m)   Wt 245 lb 9.6 oz (111.4 kg)   SpO2 96%   BMI 33.31 kg/m  , BMI  Body mass index is 33.31 kg/m. GEN: Well nourished, well developed, in no acute distress  HEENT: normal  Neck: no JVD, carotid bruits, or masses Cardiac: RRR; no murmurs, rubs, or gallops,no edema  Respiratory:  clear to auscultation bilaterally, normal work of breathing GI: soft, nontender, nondistended, + BS MS: no deformity or atrophy  Skin: warm and dry, no rash Neuro:  Strength and sensation are intact Psych: euthymic mood, full affect   EKG:  EKG is not ordered today. The ekg ordered today demonstrates N/A   Recent Labs: 08/16/2019: ALT 30 08/20/2019: BUN 14; Creatinine, Ser 1.57; Hemoglobin 12.6; Magnesium 2.0; Platelets 284; Potassium 4.0; Sodium 139    Lipid Panel    Component Value Date/Time   CHOL 173 10/20/2016 0226   TRIG 201 (H) 10/20/2016 0226   HDL 29 (L) 10/20/2016 0226   CHOLHDL 6.0 10/20/2016 0226   VLDL 40 10/20/2016 0226   LDLCALC 104 (H) 10/20/2016 0226   LDLDIRECT 107.6 08/10/2011 1136    dated 07/14/19: cholesterol 175, triglycerides 201, HDL 28, LDL 112.   Wt Readings from Last 3 Encounters:  11/06/19 245 lb 9.6 oz (111.4 kg)  08/18/19 243 lb 13.3 oz (110.6 kg)  10/21/18 243 lb (110.2 kg)      Other studies Reviewed: Additional studies/ records that were reviewed today include:  Cardiac event monitor 12/09/2017 Sinus rhythm PACs associated with palpitations Less than 1% atrial fibrillation, fastest heart rate 136 bpm Less than 1% PVCs Less than 1% PACs  Myocardial perfusion study 07/23/2017  The left ventricular ejection fraction is mildly decreased (45-54%).  Nuclear stress EF: 54%.  There was no ST segment deviation noted during stress.  This is a low risk study.  1. EF 54%, low normal to mildly reduced.  2. No evidence for ischemia or infarction on perfusion images.   Low risk study.    ASSESSMENT AND PLAN:  1.  PAF- fairly well controlled at present Continue apixaban 5 mg tablet twice daily Continue Multaq 400 mg twice  daily Limit caffeine intake-educated about caffeinated drinks If Afib frequency/severity increases would need to consider alternative AAD therapy.  2.  Coronary artery disease-s/p remote stenting of OM in 2010 with BMS.  Continue Zetia 10 mg daily Continue fenofibrate 145 mg daily Low-sodium heart healthy diet-increase fiber and increase p.o. fluid intake Increase physical activity as tolerated  3.  Hyperlipidemia- LDL 114 Continue Zetia 10 mg daily Continue fenofibrate 145 mg daily Low-sodium heart healthy diet-increase fiber Increase physical activity as tolerated We discussed history of statin intolerance. He is not sure if he is  truly intolerant. Was on lipitor before. Will try Crestor 5 mg daily. Has labs scheduled with primary care in Nov. If truly intolerant would need to consider a PCSK 9 inhibitor.   4.  Essential hypertension-blood pressure is controlled Continue olmesartan 20 mg daily Low-sodium heart healthy diet Increase physical activity as tolerated- start walking 15 min EOD.  5. CKD stage 3b  6. Imbalance/dizziness will check carotid dopplers.   Current medicines are reviewed at length with the patient today.  The patient does not have concerns regarding medicines.  The following changes have been made:  no change  Labs/ tests ordered today include:   Orders Placed This Encounter  Procedures  . VAS US CAROTID     Disposition:   FU with me in 6 months  Signed, Javen Ridings Martinique, MD  11/06/2019 12:34 PM    [facility] [address], [place], [place], [address] Phone [phone], Fax [phone]

## 2019-11-06 ENCOUNTER — Encounter: Payer: Self-pay | Admitting: Cardiology

## 2019-11-06 ENCOUNTER — Ambulatory Visit (INDEPENDENT_AMBULATORY_CARE_PROVIDER_SITE_OTHER): Payer: PPO | Admitting: Cardiology

## 2019-11-06 ENCOUNTER — Other Ambulatory Visit: Payer: Self-pay

## 2019-11-06 VITALS — BP 132/62 | HR 63 | Ht 72.0 in | Wt 245.6 lb

## 2019-11-06 DIAGNOSIS — I10 Essential (primary) hypertension: Secondary | ICD-10-CM | POA: Diagnosis not present

## 2019-11-06 DIAGNOSIS — E7800 Pure hypercholesterolemia, unspecified: Secondary | ICD-10-CM

## 2019-11-06 DIAGNOSIS — I480 Paroxysmal atrial fibrillation: Secondary | ICD-10-CM | POA: Diagnosis not present

## 2019-11-06 DIAGNOSIS — I2510 Atherosclerotic heart disease of native coronary artery without angina pectoris: Secondary | ICD-10-CM

## 2019-11-06 DIAGNOSIS — R42 Dizziness and giddiness: Secondary | ICD-10-CM | POA: Diagnosis not present

## 2019-11-06 MED ORDER — ROSUVASTATIN CALCIUM 5 MG PO TABS: 5.0000 mg | ORAL_TABLET | Freq: Every day | ORAL | 3 refills | Status: DC

## 2019-11-06 NOTE — Patient Instructions (Addendum)
Take Crestor 5 mg daily. Let me know if you develop muscle pains. Otherwise have lab work checked as planned in November.   Schedule Carotid Dopplers    Call in Feb to schedule April appointment

## 2019-11-18 ENCOUNTER — Ambulatory Visit (HOSPITAL_COMMUNITY)
Admission: RE | Admit: 2019-11-18 | Discharge: 2019-11-18 | Disposition: A | Discharge status: Home / Self Care | Payer: PPO | Source: Ambulatory Visit | Attending: Cardiology | Admitting: Cardiology

## 2019-11-18 ENCOUNTER — Other Ambulatory Visit: Payer: Self-pay

## 2019-11-18 DIAGNOSIS — E7800 Pure hypercholesterolemia, unspecified: Secondary | ICD-10-CM | POA: Diagnosis not present

## 2019-11-18 DIAGNOSIS — I480 Paroxysmal atrial fibrillation: Secondary | ICD-10-CM | POA: Insufficient documentation

## 2019-11-18 DIAGNOSIS — I10 Essential (primary) hypertension: Secondary | ICD-10-CM | POA: Diagnosis not present

## 2019-11-18 DIAGNOSIS — R42 Dizziness and giddiness: Secondary | ICD-10-CM | POA: Insufficient documentation

## 2019-11-18 DIAGNOSIS — I2510 Atherosclerotic heart disease of native coronary artery without angina pectoris: Secondary | ICD-10-CM | POA: Diagnosis not present

## 2019-11-18 MED ORDER — FENOFIBRATE 145 MG PO TABS
145.0000 mg | ORAL_TABLET | Freq: Every day | ORAL | 3 refills | Status: DC
Start: 2019-11-18 — End: 2020-12-07

## 2019-12-10 ENCOUNTER — Other Ambulatory Visit: Payer: Self-pay | Admitting: Cardiology

## 2019-12-10 MED ORDER — MULTAQ 400 MG PO TABS
400.0000 mg | ORAL_TABLET | Freq: Two times a day (BID) | ORAL | 0 refills | Status: DC
Start: 2019-12-10 — End: 2019-12-11

## 2019-12-10 NOTE — Telephone Encounter (Signed)
*  STAT* If patient is at the pharmacy, call can be transferred to refill team.   1. Which medications need to be refilled? (please list name of each medication and dose if known) dronedarone (MULTAQ) 400 MG tablet  2. Which pharmacy/location (including street and city if local pharmacy) is medication to be sent to? [facility]- Fax number:  [phone]  1. Do they need a 30 day or 90 day supply? [place]

## 2019-12-11 ENCOUNTER — Telehealth: Payer: Self-pay | Admitting: Cardiology

## 2019-12-11 ENCOUNTER — Other Ambulatory Visit: Payer: Self-pay

## 2019-12-11 MED ORDER — MULTAQ 400 MG PO TABS
400.0000 mg | ORAL_TABLET | Freq: Two times a day (BID) | ORAL | 3 refills | Status: DC
Start: 2019-12-11 — End: 2019-12-11

## 2019-12-11 MED ORDER — MULTAQ 400 MG PO TABS
400.0000 mg | ORAL_TABLET | Freq: Two times a day (BID) | ORAL | 3 refills | Status: DC
Start: 2019-12-11 — End: 2020-01-06

## 2019-12-11 NOTE — Telephone Encounter (Signed)
Patient states that he needs to talk to [facility] about what they discussed yesterday. Please advise.

## 2019-12-11 NOTE — Telephone Encounter (Signed)
Spoke to patient he stated he will need to fill out patient assistance for Multaq.Stated his current patient assistance will expire the end of Dec.Advised Multaq 400 mg samples along with a patient assistance form will be left at [place] office front desk.

## 2019-12-24 NOTE — Telephone Encounter (Signed)
Called patient left message on personal voice mail he will need to bring proof of income for patient assistance.

## 2019-12-29 DIAGNOSIS — E782 Mixed hyperlipidemia: Secondary | ICD-10-CM | POA: Diagnosis not present

## 2019-12-29 DIAGNOSIS — E1165 Type 2 diabetes mellitus with hyperglycemia: Secondary | ICD-10-CM | POA: Diagnosis not present

## 2019-12-29 DIAGNOSIS — I4892 Unspecified atrial flutter: Secondary | ICD-10-CM | POA: Diagnosis not present

## 2019-12-29 DIAGNOSIS — I2510 Atherosclerotic heart disease of native coronary artery without angina pectoris: Secondary | ICD-10-CM | POA: Diagnosis not present

## 2019-12-29 DIAGNOSIS — E1169 Type 2 diabetes mellitus with other specified complication: Secondary | ICD-10-CM | POA: Diagnosis not present

## 2019-12-29 DIAGNOSIS — R319 Hematuria, unspecified: Secondary | ICD-10-CM | POA: Diagnosis not present

## 2020-01-04 DIAGNOSIS — E1165 Type 2 diabetes mellitus with hyperglycemia: Secondary | ICD-10-CM | POA: Diagnosis not present

## 2020-01-04 DIAGNOSIS — E782 Mixed hyperlipidemia: Secondary | ICD-10-CM | POA: Diagnosis not present

## 2020-01-04 DIAGNOSIS — I700 Atherosclerosis of aorta: Secondary | ICD-10-CM | POA: Diagnosis not present

## 2020-01-04 DIAGNOSIS — M1811 Unilateral primary osteoarthritis of first carpometacarpal joint, right hand: Secondary | ICD-10-CM | POA: Diagnosis not present

## 2020-01-04 DIAGNOSIS — E538 Deficiency of other specified B group vitamins: Secondary | ICD-10-CM | POA: Diagnosis not present

## 2020-01-04 DIAGNOSIS — N1831 Chronic kidney disease, stage 3a: Secondary | ICD-10-CM | POA: Diagnosis not present

## 2020-01-06 ENCOUNTER — Telehealth: Payer: Self-pay | Admitting: Cardiology

## 2020-01-06 MED ORDER — MULTAQ 400 MG PO TABS
400.0000 mg | ORAL_TABLET | Freq: Two times a day (BID) | ORAL | 3 refills | Status: DC
Start: 2020-01-06 — End: 2020-02-08

## 2020-01-06 NOTE — Telephone Encounter (Signed)
Incoming fax from [facility]..needs to be sent back ASAP to [phone] Attn: Patient Assistance Program regarding Multaq.

## 2020-01-06 NOTE — Telephone Encounter (Signed)
Patient calling the office for samples of medication:   1.  What medication and dosage are you requesting samples for?  dronedarone (MULTAQ) 400 MG tablet [[id number]]   2.  Are you currently out of this medication? No he has 14 days lefts   Pt stated his about to be out this med and stated he normally get samples   Best number [phone]

## 2020-01-06 NOTE — Telephone Encounter (Signed)
Patient assistance paperwork completed for Multaq and faxed to [employer] at fax # [phone].

## 2020-01-06 NOTE — Telephone Encounter (Signed)
Received patient's proof of income.Patient assistance paperwork completed and faxed to [employer] at fax # [phone].

## 2020-01-06 NOTE — Telephone Encounter (Signed)
Returned call to pt he states that pt assistance did not send enough medication for this month. He will call and see why. I have put samples at the front desk for him to p/u.

## 2020-01-07 NOTE — Telephone Encounter (Signed)
**Note De-Identified Sriansh Farra Obfuscation** I have completed the form and have e-mailed it to Janan Halter, Clinical Supervisor so she can fax to [facility] [employer] at fax number written on cover letter included. Per the form, an MDs signature is only required it there has been a change is the pts Multaq dose and there has not been so no signature obtained.

## 2020-01-08 NOTE — Telephone Encounter (Signed)
This was faxed 12/2 to fax number provided.

## 2020-01-14 ENCOUNTER — Telehealth: Payer: Self-pay

## 2020-01-14 NOTE — Telephone Encounter (Signed)
Received [employer] patient assistance refill request form for Multaq.Form completed and faxed to [phone].

## 2020-01-25 ENCOUNTER — Other Ambulatory Visit: Payer: Self-pay | Admitting: Physician Assistant

## 2020-02-05 ENCOUNTER — Inpatient Hospital Stay (HOSPITAL_COMMUNITY): Payer: PPO | Admitting: Certified Registered Nurse Anesthetist

## 2020-02-05 ENCOUNTER — Inpatient Hospital Stay (HOSPITAL_COMMUNITY)
Admission: EM | Admit: 2020-02-05 | Discharge: 2020-02-07 | DRG: 660 | Disposition: A | Discharge status: Home / Self Care | Payer: PPO | Attending: Family Medicine | Admitting: Family Medicine

## 2020-02-05 ENCOUNTER — Encounter (HOSPITAL_COMMUNITY): Payer: Self-pay

## 2020-02-05 ENCOUNTER — Other Ambulatory Visit: Payer: Self-pay

## 2020-02-05 ENCOUNTER — Inpatient Hospital Stay (HOSPITAL_COMMUNITY): Payer: PPO

## 2020-02-05 ENCOUNTER — Emergency Department (HOSPITAL_COMMUNITY): Payer: PPO

## 2020-02-05 ENCOUNTER — Encounter (HOSPITAL_COMMUNITY)
Admission: EM | Disposition: A | Discharge status: Home / Self Care | Payer: Self-pay | Source: Home / Self Care | Attending: Internal Medicine

## 2020-02-05 DIAGNOSIS — K429 Umbilical hernia without obstruction or gangrene: Secondary | ICD-10-CM | POA: Diagnosis not present

## 2020-02-05 DIAGNOSIS — E785 Hyperlipidemia, unspecified: Secondary | ICD-10-CM

## 2020-02-05 DIAGNOSIS — I10 Essential (primary) hypertension: Secondary | ICD-10-CM | POA: Diagnosis not present

## 2020-02-05 DIAGNOSIS — N139 Obstructive and reflux uropathy, unspecified: Secondary | ICD-10-CM

## 2020-02-05 DIAGNOSIS — E871 Hypo-osmolality and hyponatremia: Secondary | ICD-10-CM | POA: Diagnosis not present

## 2020-02-05 DIAGNOSIS — E1122 Type 2 diabetes mellitus with diabetic chronic kidney disease: Secondary | ICD-10-CM | POA: Diagnosis present

## 2020-02-05 DIAGNOSIS — Z20822 Contact with and (suspected) exposure to covid-19: Secondary | ICD-10-CM | POA: Diagnosis not present

## 2020-02-05 DIAGNOSIS — E1169 Type 2 diabetes mellitus with other specified complication: Secondary | ICD-10-CM

## 2020-02-05 DIAGNOSIS — N132 Hydronephrosis with renal and ureteral calculous obstruction: Secondary | ICD-10-CM | POA: Diagnosis not present

## 2020-02-05 DIAGNOSIS — I129 Hypertensive chronic kidney disease with stage 1 through stage 4 chronic kidney disease, or unspecified chronic kidney disease: Secondary | ICD-10-CM | POA: Diagnosis not present

## 2020-02-05 DIAGNOSIS — N179 Acute kidney failure, unspecified: Secondary | ICD-10-CM | POA: Diagnosis not present

## 2020-02-05 DIAGNOSIS — K5900 Constipation, unspecified: Secondary | ICD-10-CM | POA: Diagnosis present

## 2020-02-05 DIAGNOSIS — Z955 Presence of coronary angioplasty implant and graft: Secondary | ICD-10-CM | POA: Diagnosis not present

## 2020-02-05 DIAGNOSIS — N1831 Chronic kidney disease, stage 3a: Secondary | ICD-10-CM | POA: Diagnosis not present

## 2020-02-05 DIAGNOSIS — E7800 Pure hypercholesterolemia, unspecified: Secondary | ICD-10-CM | POA: Diagnosis present

## 2020-02-05 DIAGNOSIS — Z79899 Other long term (current) drug therapy: Secondary | ICD-10-CM | POA: Diagnosis not present

## 2020-02-05 DIAGNOSIS — Z9049 Acquired absence of other specified parts of digestive tract: Secondary | ICD-10-CM | POA: Diagnosis not present

## 2020-02-05 DIAGNOSIS — E669 Obesity, unspecified: Secondary | ICD-10-CM | POA: Diagnosis present

## 2020-02-05 DIAGNOSIS — Z91041 Radiographic dye allergy status: Secondary | ICD-10-CM

## 2020-02-05 DIAGNOSIS — I480 Paroxysmal atrial fibrillation: Secondary | ICD-10-CM | POA: Diagnosis present

## 2020-02-05 DIAGNOSIS — Z6833 Body mass index (BMI) 33.0-33.9, adult: Secondary | ICD-10-CM

## 2020-02-05 DIAGNOSIS — K439 Ventral hernia without obstruction or gangrene: Secondary | ICD-10-CM | POA: Diagnosis not present

## 2020-02-05 DIAGNOSIS — K469 Unspecified abdominal hernia without obstruction or gangrene: Secondary | ICD-10-CM | POA: Diagnosis not present

## 2020-02-05 DIAGNOSIS — N201 Calculus of ureter: Secondary | ICD-10-CM

## 2020-02-05 DIAGNOSIS — R310 Gross hematuria: Secondary | ICD-10-CM | POA: Diagnosis not present

## 2020-02-05 DIAGNOSIS — Z7901 Long term (current) use of anticoagulants: Secondary | ICD-10-CM | POA: Diagnosis not present

## 2020-02-05 DIAGNOSIS — Z888 Allergy status to other drugs, medicaments and biological substances status: Secondary | ICD-10-CM | POA: Diagnosis not present

## 2020-02-05 DIAGNOSIS — I2510 Atherosclerotic heart disease of native coronary artery without angina pectoris: Secondary | ICD-10-CM | POA: Diagnosis not present

## 2020-02-05 DIAGNOSIS — N189 Chronic kidney disease, unspecified: Secondary | ICD-10-CM | POA: Diagnosis present

## 2020-02-05 DIAGNOSIS — N1832 Acute kidney failure, unspecified: Secondary | ICD-10-CM | POA: Diagnosis present

## 2020-02-05 DIAGNOSIS — N200 Calculus of kidney: Secondary | ICD-10-CM | POA: Diagnosis not present

## 2020-02-05 DIAGNOSIS — Z7984 Long term (current) use of oral hypoglycemic drugs: Secondary | ICD-10-CM | POA: Diagnosis not present

## 2020-02-05 DIAGNOSIS — I4892 Unspecified atrial flutter: Secondary | ICD-10-CM | POA: Diagnosis not present

## 2020-02-05 HISTORY — PX: CYSTOSCOPY W/ URETERAL STENT PLACEMENT: SHX1429

## 2020-02-05 LAB — URINALYSIS, ROUTINE W REFLEX MICROSCOPIC
Bacteria, UA: NONE SEEN
Bilirubin Urine: NEGATIVE
Glucose, UA: NEGATIVE mg/dL
Ketones, ur: NEGATIVE mg/dL
Leukocytes,Ua: NEGATIVE
Nitrite: NEGATIVE
Protein, ur: NEGATIVE mg/dL
RBC / HPF: >50 RBC/hpf — ABNORMAL HIGH (ref 0–5)
Specific Gravity, Urine: 1.012 (ref 1.005–1.030)
pH: 6 (ref 5.0–8.0)

## 2020-02-05 LAB — COMPREHENSIVE METABOLIC PANEL
ALT: 21 U/L (ref 0–44)
AST: 19 U/L (ref 15–41)
Albumin: 4.3 g/dL (ref 3.5–5.0)
Alkaline Phosphatase: 39 U/L (ref 38–126)
Anion gap: 8 (ref 5–15)
BUN: 22 mg/dL (ref 8–23)
CO2: 23 mmol/L (ref 22–32)
Calcium: 9 mg/dL (ref 8.9–10.3)
Chloride: 101 mmol/L (ref 98–111)
Creatinine, Ser: 2.72 mg/dL — ABNORMAL HIGH (ref 0.61–1.24)
GFR, Estimated: 24 mL/min — ABNORMAL LOW (ref >60)
Glucose, Bld: 153 mg/dL — ABNORMAL HIGH (ref 70–99)
Potassium: 3.8 mmol/L (ref 3.5–5.1)
Sodium: 132 mmol/L — ABNORMAL LOW (ref 135–145)
Total Bilirubin: 1 mg/dL (ref 0.3–1.2)
Total Protein: 7.3 g/dL (ref 6.5–8.1)

## 2020-02-05 LAB — CBC WITH DIFFERENTIAL/PLATELET
Abs Immature Granulocytes: 0.07 10*3/uL (ref 0.00–0.07)
Basophils Absolute: 0.1 10*3/uL (ref 0.0–0.1)
Basophils Relative: 0 %
Eosinophils Absolute: 0.2 10*3/uL (ref 0.0–0.5)
Eosinophils Relative: 2 %
HCT: 42.7 % (ref 39.0–52.0)
Hemoglobin: 14.3 g/dL (ref 13.0–17.0)
Immature Granulocytes: 1 %
Lymphocytes Relative: 17 %
Lymphs Abs: 2 10*3/uL (ref 0.7–4.0)
MCH: 31.2 pg (ref 26.0–34.0)
MCHC: 33.5 g/dL (ref 30.0–36.0)
MCV: 93.2 fL (ref 80.0–100.0)
Monocytes Absolute: 1.2 10*3/uL — ABNORMAL HIGH (ref 0.1–1.0)
Monocytes Relative: 10 %
Neutro Abs: 8.5 10*3/uL — ABNORMAL HIGH (ref 1.7–7.7)
Neutrophils Relative %: 70 %
Platelets: 303 10*3/uL (ref 150–400)
RBC: 4.58 MIL/uL (ref 4.22–5.81)
RDW: 11.9 % (ref 11.5–15.5)
WBC: 12 10*3/uL — ABNORMAL HIGH (ref 4.0–10.5)
nRBC: 0 % (ref 0.0–0.2)

## 2020-02-05 LAB — RESP PANEL BY RT-PCR (FLU A&B, COVID) ARPGX2
Influenza A by PCR: NEGATIVE
Influenza B by PCR: NEGATIVE
SARS Coronavirus 2 by RT PCR: NEGATIVE

## 2020-02-05 LAB — LIPASE, BLOOD: Lipase: 36 U/L (ref 11–51)

## 2020-02-05 LAB — GLUCOSE, CAPILLARY: Glucose-Capillary: 123 mg/dL — ABNORMAL HIGH (ref 70–99)

## 2020-02-05 SURGERY — CYSTOSCOPY, WITH RETROGRADE PYELOGRAM AND URETERAL STENT INSERTION
Anesthesia: General | Site: Urethra | Laterality: Bilateral

## 2020-02-05 MED ORDER — MORPHINE SULFATE (PF) 4 MG/ML IV SOLN
4.0000 mg | Freq: Once | INTRAVENOUS | Status: AC
  Administered 2020-02-05: 4 mg via INTRAVENOUS
  Filled 2020-02-05: qty 1

## 2020-02-05 MED ORDER — MIDAZOLAM HCL 2 MG/2ML IJ SOLN
INTRAMUSCULAR | Status: DC | PRN
  Administered 2020-02-05: 2 mg via INTRAVENOUS

## 2020-02-05 MED ORDER — IOHEXOL 300 MG/ML  SOLN
INTRAMUSCULAR | Status: DC | PRN
  Administered 2020-02-05: 10 mL via URETHRAL

## 2020-02-05 MED ORDER — PROPOFOL 10 MG/ML IV BOLUS
INTRAVENOUS | Status: AC
  Filled 2020-02-05: qty 20

## 2020-02-05 MED ORDER — FENOFIBRATE 160 MG PO TABS
160.0000 mg | ORAL_TABLET | Freq: Every day | ORAL | Status: DC
  Administered 2020-02-05 – 2020-02-07 (×3): 160 mg via ORAL
  Filled 2020-02-05 (×3): qty 1

## 2020-02-05 MED ORDER — DEXAMETHASONE SODIUM PHOSPHATE 10 MG/ML IJ SOLN
INTRAMUSCULAR | Status: DC | PRN
  Administered 2020-02-05: 8 mg via INTRAVENOUS

## 2020-02-05 MED ORDER — FENTANYL CITRATE (PF) 100 MCG/2ML IJ SOLN: 25.0000 ug | INTRAMUSCULAR | Status: DC | PRN

## 2020-02-05 MED ORDER — ACETAMINOPHEN 325 MG PO TABS: 325.0000 mg | ORAL_TABLET | ORAL | Status: DC | PRN

## 2020-02-05 MED ORDER — ONDANSETRON HCL 4 MG PO TABS: 4.0000 mg | ORAL_TABLET | Freq: Four times a day (QID) | ORAL | Status: DC | PRN

## 2020-02-05 MED ORDER — POTASSIUM CHLORIDE IN NACL 20-0.9 MEQ/L-% IV SOLN
INTRAVENOUS | Status: DC
  Filled 2020-02-05: qty 1000

## 2020-02-05 MED ORDER — ONDANSETRON HCL 4 MG/2ML IJ SOLN: 4.0000 mg | Freq: Once | INTRAMUSCULAR | Status: DC | PRN

## 2020-02-05 MED ORDER — LIDOCAINE HCL (CARDIAC) PF 100 MG/5ML IV SOSY
PREFILLED_SYRINGE | INTRAVENOUS | Status: DC | PRN
  Administered 2020-02-05: 80 mg via INTRAVENOUS

## 2020-02-05 MED ORDER — EZETIMIBE 10 MG PO TABS
10.0000 mg | ORAL_TABLET | Freq: Every day | ORAL | Status: DC
  Administered 2020-02-05 – 2020-02-07 (×3): 10 mg via ORAL
  Filled 2020-02-05 (×3): qty 1

## 2020-02-05 MED ORDER — BARIUM SULFATE 2.1 % PO SUSP
ORAL | Status: AC
  Filled 2020-02-05: qty 2

## 2020-02-05 MED ORDER — SODIUM CHLORIDE 0.9 % IR SOLN
Status: DC | PRN
  Administered 2020-02-05: 3000 mL

## 2020-02-05 MED ORDER — LACTATED RINGERS IV SOLN: INTRAVENOUS | Status: DC | PRN

## 2020-02-05 MED ORDER — INSULIN ASPART 100 UNIT/ML ~~LOC~~ SOLN: 0.0000 [IU] | Freq: Every day | SUBCUTANEOUS | Status: DC

## 2020-02-05 MED ORDER — MEPERIDINE HCL 50 MG/ML IJ SOLN: 6.2500 mg | INTRAMUSCULAR | Status: DC | PRN

## 2020-02-05 MED ORDER — DEXAMETHASONE SODIUM PHOSPHATE 10 MG/ML IJ SOLN
INTRAMUSCULAR | Status: AC
  Filled 2020-02-05: qty 1

## 2020-02-05 MED ORDER — CEFAZOLIN SODIUM-DEXTROSE 2-3 GM-%(50ML) IV SOLR
INTRAVENOUS | Status: DC | PRN
  Administered 2020-02-05: 2 g via INTRAVENOUS

## 2020-02-05 MED ORDER — ONDANSETRON HCL 4 MG/2ML IJ SOLN
4.0000 mg | Freq: Once | INTRAMUSCULAR | Status: AC
  Administered 2020-02-05: 4 mg via INTRAVENOUS
  Filled 2020-02-05: qty 2

## 2020-02-05 MED ORDER — HYDROMORPHONE HCL 1 MG/ML IJ SOLN
0.5000 mg | INTRAMUSCULAR | Status: DC | PRN
  Administered 2020-02-06 (×3): 0.5 mg via INTRAVENOUS
  Filled 2020-02-05 (×3): qty 0.5

## 2020-02-05 MED ORDER — ONDANSETRON HCL 4 MG/2ML IJ SOLN
INTRAMUSCULAR | Status: DC | PRN
  Administered 2020-02-05: 4 mg via INTRAVENOUS

## 2020-02-05 MED ORDER — DRONEDARONE HCL 400 MG PO TABS
400.0000 mg | ORAL_TABLET | Freq: Two times a day (BID) | ORAL | Status: DC
  Administered 2020-02-05 – 2020-02-07 (×4): 400 mg via ORAL
  Filled 2020-02-05 (×5): qty 1

## 2020-02-05 MED ORDER — CEFAZOLIN SODIUM-DEXTROSE 2-4 GM/100ML-% IV SOLN
INTRAVENOUS | Status: AC
  Filled 2020-02-05: qty 100

## 2020-02-05 MED ORDER — ACETAMINOPHEN 160 MG/5ML PO SOLN: 325.0000 mg | ORAL | Status: DC | PRN

## 2020-02-05 MED ORDER — OXYCODONE HCL 5 MG/5ML PO SOLN: 5.0000 mg | Freq: Once | ORAL | Status: DC | PRN

## 2020-02-05 MED ORDER — SODIUM CHLORIDE 0.9 % IV BOLUS
1000.0000 mL | Freq: Once | INTRAVENOUS | Status: AC
  Administered 2020-02-05: 1000 mL via INTRAVENOUS

## 2020-02-05 MED ORDER — ACETAMINOPHEN 325 MG PO TABS: 650.0000 mg | ORAL_TABLET | Freq: Four times a day (QID) | ORAL | Status: DC | PRN

## 2020-02-05 MED ORDER — MIDAZOLAM HCL 2 MG/2ML IJ SOLN
INTRAMUSCULAR | Status: AC
  Filled 2020-02-05: qty 2

## 2020-02-05 MED ORDER — FENTANYL CITRATE (PF) 100 MCG/2ML IJ SOLN
INTRAMUSCULAR | Status: DC | PRN
  Administered 2020-02-05 (×2): 50 ug via INTRAVENOUS

## 2020-02-05 MED ORDER — OXYCODONE HCL 5 MG PO TABS: 5.0000 mg | ORAL_TABLET | Freq: Once | ORAL | Status: DC | PRN

## 2020-02-05 MED ORDER — ONDANSETRON HCL 4 MG/2ML IJ SOLN
INTRAMUSCULAR | Status: AC
  Filled 2020-02-05: qty 2

## 2020-02-05 MED ORDER — ONDANSETRON HCL 4 MG/2ML IJ SOLN: 4.0000 mg | Freq: Four times a day (QID) | INTRAMUSCULAR | Status: DC | PRN

## 2020-02-05 MED ORDER — SODIUM CHLORIDE 0.9 % IV SOLN: Freq: Once | INTRAVENOUS | Status: AC

## 2020-02-05 MED ORDER — LIDOCAINE HCL (PF) 2 % IJ SOLN
INTRAMUSCULAR | Status: AC
  Filled 2020-02-05: qty 5

## 2020-02-05 MED ORDER — ROSUVASTATIN CALCIUM 5 MG PO TABS
5.0000 mg | ORAL_TABLET | Freq: Every day | ORAL | Status: DC
Start: 2020-02-05 — End: 2020-02-07
  Administered 2020-02-05 – 2020-02-07 (×3): 5 mg via ORAL
  Filled 2020-02-05 (×3): qty 1

## 2020-02-05 MED ORDER — INSULIN ASPART 100 UNIT/ML ~~LOC~~ SOLN
0.0000 [IU] | Freq: Three times a day (TID) | SUBCUTANEOUS | Status: DC
  Administered 2020-02-05: 1 [IU] via SUBCUTANEOUS
  Administered 2020-02-06: 2 [IU] via SUBCUTANEOUS
  Administered 2020-02-06: 1 [IU] via SUBCUTANEOUS
  Administered 2020-02-06: 3 [IU] via SUBCUTANEOUS
  Administered 2020-02-07: 2 [IU] via SUBCUTANEOUS

## 2020-02-05 MED ORDER — PROPOFOL 10 MG/ML IV BOLUS
INTRAVENOUS | Status: DC | PRN
  Administered 2020-02-05: 160 mg via INTRAVENOUS

## 2020-02-05 MED ORDER — FENTANYL CITRATE (PF) 100 MCG/2ML IJ SOLN
INTRAMUSCULAR | Status: AC
  Filled 2020-02-05: qty 2

## 2020-02-05 MED ORDER — ACETAMINOPHEN 650 MG RE SUPP: 650.0000 mg | Freq: Four times a day (QID) | RECTAL | Status: DC | PRN

## 2020-02-05 SURGICAL SUPPLY — 14 items
BAG URO CATCHER STRL LF (MISCELLANEOUS) ×2 IMPLANT
BASKET ZERO TIP NITINOL 2.4FR (BASKET) IMPLANT
CATH INTERMIT  6FR 70CM (CATHETERS) ×2 IMPLANT
CLOTH BEACON ORANGE TIMEOUT ST (SAFETY) ×2 IMPLANT
GLOVE SURG ENC TEXT LTX SZ7.5 (GLOVE) ×2 IMPLANT
GOWN STRL REUS W/TWL LRG LVL3 (GOWN DISPOSABLE) ×2 IMPLANT
GUIDEWIRE ANG ZIPWIRE 038X150 (WIRE) ×2 IMPLANT
GUIDEWIRE STR DUAL SENSOR (WIRE) IMPLANT
KIT TURNOVER KIT A (KITS) ×2 IMPLANT
MANIFOLD NEPTUNE II (INSTRUMENTS) ×2 IMPLANT
PACK CYSTO (CUSTOM PROCEDURE TRAY) ×2 IMPLANT
STENT POLARIS 5FRX26 (STENTS) ×4 IMPLANT
TUBING CONNECTING 10 (TUBING) ×2 IMPLANT
TUBING UROLOGY SET (TUBING) IMPLANT

## 2020-02-05 NOTE — H&P (Addendum)
History and Physical  Shawn Hernandez BZJ:[id number] DOB: 06-23-1950 DOA: 02/05/2020   PCP: Shawn Seashore, Hernandez   Patient coming from: Home  Chief Complaint: LLQ pain  HPI:  Shawn Hernandez is a 69 y.o. male with medical history of diabetes mellitus type 2, hypertension, paroxysmal atrial fibrillation on apixaban, CKD stage III, coronary artery disease, hyperlipidemia presenting with left lower quadrant abdominal pain that began on Shawn evening of 02/03/2020.  Shawn patient tried using some over-Shawn-counter laxatives.  He did have a bowel movement, but this did not improve his pain.  He had some nausea without any emesis.  He had a temperature 100.3 F on Shawn evening of 02/04/2020.  He has not had any hematochezia melena.  However, because of worsening abdominal pain, Shawn patient presented for further evaluation.  He denies any headache, chest pain, shortness breath, hemoptysis, hematemesis, dysuria. In Shawn emergency department, Shawn patient had low-grade temperature of 99.2 F.  He was hemodynamically stable.  Oxygen saturation was 95% on room air.  CT of Shawn abdomen and pelvis showed a proximal left ureteral stone measuring 4.5 x 4 mm with left hydronephrosis and perinephric edema.  BMP showed a sodium 132, potassium 3.8, serum creatinine 2.72.  LFTs were unremarkable.  WBC 12.0, hemoglobin 14.3, platelets 203,000.  Shawn patient was started on intravenous fluids.  Urology was consulted and recommended transfer to Shawn Hernandez for possible ureteral stent.  Assessment/Plan: Acute on chronic renal failure--CKD 3a -Primarily due to obstructive uropathy from left ureteral stone. -start IVF -Baseline creatinine 1.4-1.5 -Presented with serum creatinine 2.72 -02/05/2020 CT abd/pelvis--proximal left ureteral stone 4.5 x 4 mm with left hydronephrosis and perinephric edema -urology consulted-->transfer to Shawn Hernandez;  May need ureteral stent if no improvement in next 24 hours -hold olmesartan  Left ureteral  stone -CT abdomen and pelvis as discussed above -Judicious opioids -Obtain urine culture -Urology consulted as discussed above  Paroxysmal atrial fibrillation -Currently in sinus rhythm -Holding apixaban for possible operative intervention -Restart dronedarone  Essential hypertension -hold olmesartan due to AKI  Diabetes mellitus type 2 -08/17/2019 hemoglobin A1c 7.6 -Holding Trulicity, Metformin, glimepiride -ISS  Coronary artery disease -No chest pain presently - 2 bare-metal stents to Shawn OM in 2010 with residual 50% LAD and 70% RCA stenosis -Continue Zetia and fenofibrate  Hyperlipidemia -Continue Zetia and Crestor      Past Medical History:  Diagnosis Date  . Allergy to iodine   . Arthritis    discomfort in hands and arms  . Atrial flutter, paroxysmal (Shawn Hernandez)   . Coronary artery disease   . Diabetes type 2, controlled ([place]) 10/2016  . Diverticulosis   . Hypercholesterolemia   . Hypertension   . Psoriatic arthritis ([place])   . Sinusitis    Past Surgical History:  Procedure Laterality Date  . A-FLUTTER ABLATION N/A 10/17/2017   Procedure: A-FLUTTER ABLATION;  Surgeon: Shawn Haw, Hernandez;  Location: [facility];  Service: Cardiovascular;  Laterality: N/A;  . COLON SURGERY  2001   2 feet removed re- diverticulosis  . CORONARY STENT PLACEMENT  2011   sequential bare-metal  . INGUINAL HERNIA REPAIR    . OTHER SURGICAL HISTORY     appendectomy   Social History:  reports that he has never smoked. He has never used smokeless tobacco. No history on file for alcohol use and drug use.   Family History  Problem Relation Age of Onset  . Heart disease Mother   . Heart disease Father   .  Heart disease Brother   . Diabetes Brother      Allergies  Allergen Reactions  . Ivp Dye [Iodinated Diagnostic Agents] Hives  . Lisinopril Other (See Comments)    cough  . Statins Other (See Comments)    myalgias  . Iodine Rash     Prior to Admission  medications   Medication Sig Start Date End Date Taking? Authorizing Provider  apixaban (ELIQUIS) 5 MG TABS tablet Take 1 tablet (5 mg total) by mouth 2 (two) times daily. 05/19/19   Hernandez, Shawn Hernandez  blood glucose meter kit and supplies Dispense based on patient and insurance preference. Use up to four times daily as directed. (FOR ICD-10 E11.10). 10/21/16   Shawn Iba, Hernandez  dronedarone (MULTAQ) 400 MG tablet Take 1 tablet (400 mg total) by mouth 2 (two) times daily with a meal. 01/06/20   Hernandez, Shawn Hernandez  ezetimibe (ZETIA) 10 MG tablet TAKE 1 TABLET BY MOUTH EVERY DAY 01/25/20   Hernandez, Shawn Hernandez  fenofibrate (TRICOR) 145 MG tablet Take 1 tablet (145 mg total) by mouth daily. 11/18/19   Hernandez, Shawn Hernandez  glimepiride (AMARYL) 4 MG tablet Take 4 mg by mouth daily with breakfast.    Provider, Historical, Hernandez  Magnesium 400 MG TABS Take 1 tablet by mouth daily.     Provider, Historical, Hernandez  metFORMIN (GLUCOPHAGE-XR) 500 MG 24 hr tablet Take 500 mg by mouth daily with breakfast.    Provider, Historical, Hernandez  olmesartan (BENICAR) 5 MG tablet Take 5 mg by mouth daily. 07/07/19   Provider, Historical, Hernandez  rosuvastatin (CRESTOR) 5 MG tablet Take 1 tablet (5 mg total) by mouth daily. 11/06/19 10/31/20  Hernandez, Shawn Hernandez  traMADol (ULTRAM) 50 MG tablet Take 1 tablet (50 mg total) by mouth every 6 (six) hours as needed for moderate pain. 08/20/19   Aline August, Hernandez  TRULICITY 2.95 JO/8.4ZY SOPN Inject 0.75 mg into Shawn skin every Friday.  08/27/17   Provider, Historical, Hernandez    Review of Systems:  Constitutional:  No weight loss, night sweats,  fatigue.  Head&Eyes: No headache.  No vision loss.  No eye pain or scotoma ENT:  No Difficulty swallowing,Tooth/dental problems,Sore throat,  No ear ache, post nasal drip,  Cardio-vascular:  No chest pain, Orthopnea, PND, swelling in lower extremities,  dizziness, palpitations  GI:  No   vomiting, diarrhea, loss of appetite, hematochezia,  melena, heartburn, indigestion, Resp:  No shortness of breath with exertion or at rest. No cough. No coughing up of blood .No wheezing.No chest wall deformity  Skin:  no rash or lesions.  GU:  no dysuria, change in color of urine, no urgency or frequency. No flank pain.  Musculoskeletal:  No joint pain or swelling. No decreased range of motion. No back pain.  Psych:  No change in mood or affect. No depression or anxiety. Neurologic: No headache, no dysesthesia, no focal weakness, no vision loss. No syncope  Physical Exam: Vitals:   02/05/20 1100 02/05/20 1130 02/05/20 1200 02/05/20 1230  BP: (!) 150/81 (!) 144/76 131/65 (!) 143/78  Pulse: 77 91 80 81  Resp:  _0 Temp:      TempSrc:      SpO2: 94% 98% 96% 97%  Weight:      Height:       General:  A&O x 3, NAD, nontoxic, pleasant/cooperative Head/Eye: No conjunctival hemorrhage, no icterus, [place]/AT, No nystagmus ENT:  No icterus,  No  thrush, good dentition, no pharyngeal exudate Neck:  No masses, no lymphadenpathy, no bruits CV:  RRR, no rub, no gallop, no S3 Lung:  CTAB, good air movement, no wheeze, no rhonchi Abdomen: soft/LLQ pain, +BS, nondistended, no peritoneal signs Ext: No cyanosis, No rashes, No petechiae, No lymphangitis, No edema Neuro: CNII-XII intact, strength 4/5 in bilateral upper and lower extremities, no dysmetria  Labs on Admission:  Basic Metabolic Panel: Recent Labs  Lab 02/05/20 0905  NA 132*  K 3.8  CL 101  CO2 23  GLUCOSE 153*  BUN 22  CREATININE 2.72*  CALCIUM 9.0   Liver Function Tests: Recent Labs  Lab 02/05/20 0905  AST 19  ALT 21  ALKPHOS 39  BILITOT 1.0  PROT 7.3  ALBUMIN 4.3   Recent Labs  Lab 02/05/20 0905  LIPASE 36   No results for input(s): AMMONIA in Shawn last 168 hours. CBC: Recent Labs  Lab 02/05/20 0905  WBC 12.0*  NEUTROABS 8.5*  HGB 14.3  HCT 42.7  MCV 93.2  PLT 303   Coagulation Profile: No results for input(s): INR, PROTIME in Shawn last 168  hours. Cardiac Enzymes: No results for input(s): CKTOTAL, CKMB, CKMBINDEX, TROPONINI in Shawn last 168 hours. BNP: Invalid input(s): POCBNP CBG: No results for input(s): GLUCAP in Shawn last 168 hours. Urine analysis:    Component Value Date/Time   COLORURINE YELLOW 02/05/2020 [place] 02/05/2020 0835   LABSPEC 1.012 02/05/2020 0835   PHURINE 6.0 02/05/2020 0835   GLUCOSEU NEGATIVE 02/05/2020 0835   HGBUR LARGE (A) 02/05/2020 0835   BILIRUBINUR NEGATIVE 02/05/2020 0835   KETONESUR NEGATIVE 02/05/2020 0835   PROTEINUR NEGATIVE 02/05/2020 0835   NITRITE NEGATIVE 02/05/2020 0835   LEUKOCYTESUR NEGATIVE 02/05/2020 0835   Sepsis Labs: _0 (procalcitonin:4,lacticidven:4) ) Recent Results (from Shawn past 240 hour(s))  Resp Panel by RT-PCR (Flu A&B, Covid) Nasopharyngeal Swab     Status: None   Collection Time: 02/05/20 11:17 AM   Specimen: Nasopharyngeal Swab; Nasopharyngeal(NP) swabs in vial transport medium  Result Value Ref Range Status   SARS Coronavirus 2 by RT PCR NEGATIVE NEGATIVE Final    Comment: (NOTE) SARS-CoV-2 target nucleic acids are NOT DETECTED.  Shawn SARS-CoV-2 RNA is generally detectable in upper respiratory specimens during Shawn acute phase of infection. Shawn lowest concentration of SARS-CoV-2 viral copies this assay can detect is 138 copies/mL. A negative result does not preclude SARS-Cov-2 infection and should not be used as Shawn sole basis for treatment or other patient management decisions. A negative result may occur with  improper specimen collection/handling, submission of specimen other than nasopharyngeal swab, presence of viral mutation(s) within Shawn areas targeted by this assay, and inadequate number of viral copies(<138 copies/mL). A negative result must be combined with clinical observations, patient history, and epidemiological information. Shawn expected result is Negative.  Fact Sheet for Patients:   EntrepreneurPulse.com.au  Fact Sheet for Healthcare Providers:  IncredibleEmployment.be  This test is no t yet approved or cleared by [place] FDA and  has been authorized for detection and/or diagnosis of SARS-CoV-2 by FDA under an Emergency Use Authorization (EUA). This EUA will remain  in effect (meaning this test can be used) for Shawn duration of Shawn COVID-19 declaration under Section 564(b)(1) of Shawn Act, 21 U.S.C.section 360bbb-3(b)(1), unless Shawn authorization is terminated  or revoked sooner.       Influenza A by PCR NEGATIVE NEGATIVE Final   Influenza B by PCR NEGATIVE NEGATIVE Final    Comment: (  NOTE) Shawn Xpert Xpress SARS-CoV-2/FLU/RSV plus assay is intended as an aid in Shawn diagnosis of influenza from Nasopharyngeal swab specimens and should not be used as a sole basis for treatment. Nasal washings and aspirates are unacceptable for Xpert Xpress SARS-CoV-2/FLU/RSV testing.  Fact Sheet for Patients: EntrepreneurPulse.com.au  Fact Sheet for Healthcare Providers: IncredibleEmployment.be  This test is not yet approved or cleared by [place] FDA and has been authorized for detection and/or diagnosis of SARS-CoV-2 by FDA under an Emergency Use Authorization (EUA). This EUA will remain in effect (meaning this test can be used) for Shawn duration of Shawn COVID-19 declaration under Section 564(b)(1) of Shawn Act, 21 U.S.C. section 360bbb-3(b)(1), unless Shawn authorization is terminated or revoked.  Performed at [facility], [address] [place], [place] [address]      Radiological Exams on Admission: CT Abdomen Pelvis Wo Contrast  Result Date: 02/05/2020 CLINICAL DATA:  LEFT-sided abdominal pain since Wednesday night. EXAM: CT ABDOMEN AND PELVIS WITHOUT CONTRAST TECHNIQUE: Multidetector CT imaging of Shawn abdomen and pelvis was performed following Shawn standard protocol without IV  contrast. COMPARISON:  CT abdomen dated 10/19/2019 FINDINGS: Lower chest: Mild bibasilar atelectasis. Hepatobiliary: Liver is diffusely low in density indicating fatty infiltration. No focal liver abnormality is seen. Gallbladder is unremarkable. No bile duct dilatation. Pancreas: Unremarkable. No pancreatic ductal dilatation or surrounding inflammatory changes. Spleen: Normal in size without focal abnormality. Adrenals/Urinary Tract: Moderate LEFT-sided hydronephrosis. Proximal LEFT ureteral stone measures 4.5 x 4 mm. No additional ureteral stone. Bladder is unremarkable, partially decompressed. 2 mm LEFT renal stone. 8 mm and 4 mm nonobstructing RIGHT renal stones. Stomach/Bowel: Surgical changes of a partial colectomy at Shawn level of Shawn sigmoid colon. No dilated large or small bowel loops. Mild diverticulosis of Shawn LEFT colon without focal inflammation to suggest acute diverticulitis. No evidence of bowel wall inflammation. Stomach is unremarkable. Vascular/Lymphatic: Aortic atherosclerosis. No enlarged lymph nodes are seen. Reproductive: Prostate gland is not enlarged. Other: No free fluid or abscess collection. No free intraperitoneal air. Midline supraumbilical abdominal wall hernia which contains fat only. Musculoskeletal: No acute or suspicious osseous finding. Trabecular thickening of Shawn LEFT-sided pubic rami and LEFT acetabulum suggesting some degree of Paget's disease. Mild degenerative spondylosis of Shawn thoracolumbar spine. IMPRESSION: 1. Proximal LEFT ureteral stone measures 4.5 x 4 mm, causing moderate LEFT-sided hydronephrosis and perinephric edema. 2. Bilateral nephrolithiasis. 3. Fatty infiltration of Shawn liver. 4. Colonic diverticulosis without evidence of acute diverticulitis. 5. Midline supraumbilical abdominal wall hernia which contains fat only. 6. 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 Aortic Atherosclerosis (ICD10-I70.0). Electronically Signed   By: Franki Cabot Hernandez.D.   On:  02/05/2020 11:40        Time spent:60 minutes Code Status:   FULL Family Communication:  No Family at bedside Disposition Plan: expect 2 day hospitalization Consults called: urology DVT Prophylaxis: apixaban  Orson Eva, DO  [facility] Pager [phone]  If 7PM-7AM, please contact night-coverage [email or web address] Password [facility] 02/05/2020, 12:55 PM

## 2020-02-05 NOTE — Brief Op Note (Signed)
02/05/2020  10:23 PM  PATIENT:  Shawn Hernandez  69 y.o. male  PRE-OPERATIVE DIAGNOSIS:  BILATERAL STONES,RENAL FAILURE  POST-OPERATIVE DIAGNOSIS:  BILATERAL STONES,RENAL FAILURE  PROCEDURE:  Procedure(s): CYSTOSCOPY WITH RETROGRADE PYELOGRAM/URETERAL STENT PLACEMENT (Bilateral)  SURGEON:  Surgeon(s) and Role:    * Sebastian Ache, MD - Primary  PHYSICIAN ASSISTANT:   ASSISTANTS: none   ANESTHESIA:   general  EBL:  minimal   BLOOD ADMINISTERED:none  DRAINS: none   LOCAL MEDICATIONS USED:  NONE  SPECIMEN:  No Specimen  DISPOSITION OF SPECIMEN:  N/A  COUNTS:  YES  TOURNIQUET:  * No tourniquets in log *  DICTATION: .Other Dictation: Dictation Number [id number]  PLAN OF CARE: Admit to inpatient   PATIENT DISPOSITION:  PACU - hemodynamically stable.   Delay start of Pharmacological VTE agent (>24hrs) due to surgical blood loss or risk of bleeding: yes

## 2020-02-05 NOTE — ED Triage Notes (Signed)
Pt presents to ED with complaints of left sided abdominal pain since Wednesday night. Pt denies N/V/D. Last [facility] Wednesday.

## 2020-02-05 NOTE — Anesthesia Procedure Notes (Signed)
Procedure Name: LMA Insertion Date/Time: 02/05/2020 10:00 PM Performed by: Yolonda Kida, CRNA Pre-anesthesia Checklist: Patient identified, Emergency Drugs available, Suction available and Patient being monitored Patient Re-evaluated:Patient Re-evaluated prior to induction Oxygen Delivery Method: Circle system utilized Preoxygenation: Pre-oxygenation with 100% oxygen Induction Type: IV induction LMA: LMA inserted LMA Size: 5.0 Number of attempts: 1 Placement Confirmation: positive ETCO2 and breath sounds checked- equal and bilateral Tube secured with: Tape Dental Injury: Teeth and Oropharynx as per pre-operative assessment

## 2020-02-05 NOTE — ED Provider Notes (Signed)
[facility] EMERGENCY DEPARTMENT Provider Note   CSN: [id number] Arrival date & time: 02/05/20  0805     History Chief Complaint  Patient presents with  . Abdominal Pain    Shawn Hernandez is a 69 y.o. male.  He has a history of diabetes atrial flutter on anticoagulation and diverticulitis requiring resection of bowel.  Complaining of 3 days of left lower quadrant abdominal pain and having no bowel movements.  Associated with a low-grade fever temperature 100.3.  Some nausea.  No vomiting.  Tried laxative without improvement.  Feels he has a bowel obstruction.  He is passing gas.  The history is provided by the patient.  Abdominal Pain Pain location:  LLQ Pain quality: aching   Pain radiates to:  Back Pain severity:  Moderate Onset quality:  Gradual Timing:  Constant Progression:  Worsening Chronicity:  New Context: not trauma   Relieved by:  Nothing Worsened by:  Nothing Ineffective treatments: laxative. Associated symptoms: constipation, fever, flatus and nausea   Associated symptoms: no chest pain, no cough, no diarrhea, no dysuria, no hematemesis, no hematochezia, no hematuria, no shortness of breath, no sore throat and no vomiting        Past Medical History:  Diagnosis Date  . Allergy to iodine   . Arthritis    discomfort in hands and arms  . Atrial flutter, paroxysmal ([place])   . Coronary artery disease   . Diabetes type 2, controlled ([place]) 10/2016  . Diverticulosis   . Hypercholesterolemia   . Hypertension   . Psoriatic arthritis ([place])   . Sinusitis     Patient Active Problem List   Diagnosis Date Noted  . Diverticulitis of small intestine with complication 64/15/8309  . CKD (chronic kidney disease), stage III ([place]) 08/16/2019  . Statin myopathy 02/04/2019  . Type 2 diabetes mellitus without complication ([place]) 40/76/8088  . DKA (diabetic ketoacidoses) 10/19/2016  . Nausea and vomiting 10/19/2016  . Acute kidney injury ([place]) 10/19/2016  .  Hyponatremia 10/19/2016  . Coronary artery disease   . Hypertension associated with diabetes ([place])   . Hyperlipidemia associated with type 2 diabetes mellitus ([place])   . Atrial flutter, paroxysmal ([place])   . Sinusitis   . Diverticulosis   . Arthritis     Past Surgical History:  Procedure Laterality Date  . A-FLUTTER ABLATION N/A 10/17/2017   Procedure: A-FLUTTER ABLATION;  Surgeon: Constance Haw, MD;  Location: [place] [facility];  Service: Cardiovascular;  Laterality: N/A;  . COLON SURGERY  2001   2 feet removed re- diverticulosis  . CORONARY STENT PLACEMENT  2011   sequential bare-metal  . INGUINAL HERNIA REPAIR    . OTHER SURGICAL HISTORY     appendectomy       Family History  Problem Relation Age of Onset  . Heart disease Mother   . Heart disease Father   . Heart disease Brother   . Diabetes Brother     Social History   Tobacco Use  . Smoking status: Never Smoker  . Smokeless tobacco: Never Used    Home Medications Prior to Admission medications   Medication Sig Start Date End Date Taking? Authorizing Provider  apixaban (ELIQUIS) 5 MG TABS tablet Take 1 tablet (5 mg total) by mouth 2 (two) times daily. 05/19/19   Camnitz, Ocie Doyne, MD  blood glucose meter kit and supplies Dispense based on patient and insurance preference. Use up to four times daily as directed. (FOR ICD-10 E11.10). 10/21/16  Johnson, Clanford L, MD  dronedarone (MULTAQ) 400 MG tablet Take 1 tablet (400 mg total) by mouth 2 (two) times daily with a meal. 01/06/20   [place], Peter M, MD  ezetimibe (ZETIA) 10 MG tablet TAKE 1 TABLET BY MOUTH EVERY DAY 01/25/20   [place], Peter M, MD  fenofibrate (TRICOR) 145 MG tablet Take 1 tablet (145 mg total) by mouth daily. 11/18/19   [place], Peter M, MD  glimepiride (AMARYL) 4 MG tablet Take 4 mg by mouth daily with breakfast.    [provider]  Magnesium 400 MG TABS Take 1 tablet by mouth daily.     [provider]  metFORMIN  (GLUCOPHAGE-XR) 500 MG 24 hr tablet Take 500 mg by mouth daily with breakfast.    [provider]  olmesartan (BENICAR) 5 MG tablet Take 5 mg by mouth daily. 07/07/19   [provider]  rosuvastatin (CRESTOR) 5 MG tablet Take 1 tablet (5 mg total) by mouth daily. 11/06/19 10/31/20  [place], Peter M, MD  traMADol (ULTRAM) 50 MG tablet Take 1 tablet (50 mg total) by mouth every 6 (six) hours as needed for moderate pain. 08/20/19   Aline August, MD  TRULICITY 2.77 OE/4.2PN SOPN Inject 0.75 mg into the skin every Friday.  08/27/17   [provider]    Allergies    Ivp dye [iodinated diagnostic agents], Lisinopril, Statins, and Iodine  Review of Systems   Review of Systems  Constitutional: Positive for fever.  HENT: Negative for sore throat.   Eyes: Negative for visual disturbance.  Respiratory: Negative for cough and shortness of breath.   Cardiovascular: Negative for chest pain.  Gastrointestinal: Positive for abdominal pain, constipation, flatus and nausea. Negative for diarrhea, hematemesis, hematochezia and vomiting.  Genitourinary: Negative for dysuria and hematuria.  Musculoskeletal: Positive for back pain.  Skin: Negative for rash.  Neurological: Negative for headaches.    Physical Exam Updated Vital Signs BP (!) 152/73 (BP Location: Right Arm)   Pulse 85   Temp 99.2 F (37.3 C) (Oral)   Resp 18   Ht 6' (1.829 m)   Wt 111.1 kg   SpO2 95%   BMI 33.23 kg/m   Physical Exam Vitals and nursing note reviewed.  Constitutional:      Appearance: Normal appearance. He is well-developed and well-nourished.  HENT:     Head: Normocephalic and atraumatic.  Eyes:     Conjunctiva/sclera: Conjunctivae normal.  Cardiovascular:     Rate and Rhythm: Normal rate and regular rhythm.     Heart sounds: No murmur heard.   Pulmonary:     Effort: Pulmonary effort is normal. No respiratory distress.     Breath sounds: Normal breath sounds.  Abdominal:      Palpations: Abdomen is soft. There is no mass.     Tenderness: There is abdominal tenderness (LLQ). There is no guarding or rebound.  Musculoskeletal:        General: No deformity, signs of injury or edema. Normal range of motion.     Cervical back: Neck supple.  Skin:    General: Skin is warm and dry.  Neurological:     General: No focal deficit present.     Mental Status: He is alert.     Gait: Gait normal.  Psychiatric:        Mood and Affect: Mood and affect normal.     ED Results / Procedures / Treatments   Labs (all labs ordered are listed, but only abnormal results  are displayed) Labs Reviewed  CBC WITH DIFFERENTIAL/PLATELET - Abnormal; Notable for the following components:      Result Value   WBC 12.0 (*)    Neutro Abs 8.5 (*)    Monocytes Absolute 1.2 (*)    All other components within normal limits  COMPREHENSIVE METABOLIC PANEL - Abnormal; Notable for the following components:   Sodium 132 (*)    Glucose, Bld 153 (*)    Creatinine, Ser 2.72 (*)    GFR, Estimated 24 (*)    All other components within normal limits  URINALYSIS, ROUTINE W REFLEX MICROSCOPIC - Abnormal; Notable for the following components:   Hgb urine dipstick LARGE (*)    RBC / HPF >50 (*)    All other components within normal limits  RESP PANEL BY RT-PCR (FLU A&B, COVID) ARPGX2  CULTURE, BLOOD (ROUTINE X 2)  SARS CORONAVIRUS 2 (TAT 6-24 HRS)  CULTURE, BLOOD (ROUTINE X 2)  URINE CULTURE  LIPASE, BLOOD  HEMOGLOBIN P9X  BASIC METABOLIC PANEL  CBC    EKG None  Radiology CT Abdomen Pelvis Wo Contrast  Result Date: 02/05/2020 CLINICAL DATA:  LEFT-sided abdominal pain since Wednesday night. EXAM: CT ABDOMEN AND PELVIS WITHOUT CONTRAST TECHNIQUE: Multidetector CT imaging of the abdomen and pelvis was performed following the standard protocol without IV contrast. COMPARISON:  CT abdomen dated 10/19/2019 FINDINGS: Lower chest: Mild bibasilar atelectasis. Hepatobiliary: Liver is diffusely low in  density indicating fatty infiltration. No focal liver abnormality is seen. Gallbladder is unremarkable. No bile duct dilatation. Pancreas: Unremarkable. No pancreatic ductal dilatation or surrounding inflammatory changes. Spleen: Normal in size without focal abnormality. Adrenals/Urinary Tract: Moderate LEFT-sided hydronephrosis. Proximal LEFT ureteral stone measures 4.5 x 4 mm. No additional ureteral stone. Bladder is unremarkable, partially decompressed. 2 mm LEFT renal stone. 8 mm and 4 mm nonobstructing RIGHT renal stones. Stomach/Bowel: Surgical changes of a partial colectomy at the level of the sigmoid colon. No dilated large or small bowel loops. Mild diverticulosis of the LEFT colon without focal inflammation to suggest acute diverticulitis. No evidence of bowel wall inflammation. Stomach is unremarkable. Vascular/Lymphatic: Aortic atherosclerosis. No enlarged lymph nodes are seen. Reproductive: Prostate gland is not enlarged. Other: No free fluid or abscess collection. No free intraperitoneal air. Midline supraumbilical abdominal wall hernia which contains fat only. Musculoskeletal: No acute or suspicious osseous finding. Trabecular thickening of the LEFT-sided pubic rami and LEFT acetabulum suggesting some degree of Paget's disease. Mild degenerative spondylosis of the thoracolumbar spine. IMPRESSION: 1. Proximal LEFT ureteral stone measures 4.5 x 4 mm, causing moderate LEFT-sided hydronephrosis and perinephric edema. 2. Bilateral nephrolithiasis. 3. Fatty infiltration of the liver. 4. Colonic diverticulosis without evidence of acute diverticulitis. 5. Midline supraumbilical abdominal wall hernia which contains fat only. 6. 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 Aortic Atherosclerosis (ICD10-I70.0). Electronically Signed   By: Franki Cabot M.D.   On: 02/05/2020 11:40    Procedures Procedures (including critical care time)  Medications Ordered in ED Medications  Barium Sulfate 2.1 % SUSP  (has no administration in time range)  dronedarone (MULTAQ) tablet 400 mg (400 mg Oral Given 02/05/20 1616)  ezetimibe (ZETIA) tablet 10 mg (10 mg Oral Given 02/05/20 1616)  fenofibrate tablet 160 mg (160 mg Oral Given 02/05/20 1616)  rosuvastatin (CRESTOR) tablet 5 mg (5 mg Oral Given 02/05/20 1616)  acetaminophen (TYLENOL) tablet 650 mg (has no administration in time range)    Or  acetaminophen (TYLENOL) suppository 650 mg (has no administration in time range)  ondansetron (ZOFRAN)  tablet 4 mg (has no administration in time range)    Or  ondansetron (ZOFRAN) injection 4 mg (has no administration in time range)  HYDROmorphone (DILAUDID) injection 0.5 mg (has no administration in time range)  0.9 % NaCl with KCl 20 mEq/ L  infusion ( Intravenous New Bag/Given 02/05/20 1527)  insulin aspart (novoLOG) injection 0-9 Units (1 Units Subcutaneous Given 02/05/20 1709)  insulin aspart (novoLOG) injection 0-5 Units (has no administration in time range)  sodium chloride 0.9 % bolus 1,000 mL (0 mLs Intravenous Stopped 02/05/20 1019)  ondansetron (ZOFRAN) injection 4 mg (4 mg Intravenous Given 02/05/20 0853)  morphine 4 MG/ML injection 4 mg (4 mg Intravenous Given 02/05/20 0853)  morphine 4 MG/ML injection 4 mg (4 mg Intravenous Given 02/05/20 1236)  0.9 %  sodium chloride infusion ( Intravenous New Bag/Given 02/05/20 1236)    ED Course  I have reviewed the triage vital signs and the nursing notes.  Pertinent labs & imaging results that were available during my care of the patient were reviewed by me and considered in my medical decision making (see chart for details).  Clinical Course as of 02/05/20 1751  Fri Feb 05, 2020  1033 Patient's labs coming back with a new AKI creatinine 2.7.  Baseline looks closer to 1.5.  He also has an elevated white count.  He also has greater than 50 reds in his urine.  He has had no urinary symptoms and has not noticed any color change or blood in his urine.  He said  he has heard that before that he has blood in his urine sometimes. [MB]  1211 Discussed with Dr. Alyson Ingles urology.  He says he does not think he needs any emergent intervention right now but he agrees with admitting for hydration and following his renal function.  He may need stenting tomorrow if not improving.  [facility] because there are spread then with holiday coverage. [MB]  1212 Discussed with Dr. Carles Collet [facility] hospitalist who will evaluate the patient for admission. [MB]    Clinical Course User Index [MB] Hayden Rasmussen, MD   MDM Rules/Calculators/A&P                         This patient complains of constipation, abdominal pain, back pain; this involves an extensive number of treatment Options and is a complaint that carries with it a high risk of complications and Morbidity. The differential includes colitis, diverticulitis, obstruction, perforation, abdominal aneurysm, renal colic  I ordered, reviewed and interpreted labs, which included a white count, normal hemoglobin, chemistries fairly normal other than mildly elevated glucose and markedly elevated creatinine Covid testing negative lipase, urinalysis with hematuria I ordered medication IV fluids IV nausea and pain medicine I ordered imaging studies which included CT abdomen and pelvis without contrast and I independently    visualized and interpreted imaging which showed 4 mm proximal left ureteral stone with hydro- Previous records obtained and reviewed in epic, was admitted for diverticulitis last year I consulted urology Dr. Alyson Ingles and [facility] hospitalist Dr. Carles Collet and discussed lab and imaging findings  Critical Interventions: None  After the interventions stated above, I reevaluated the patient and found patient to be minimally symptomatic.  Reviewed results with him.  He is comfortable plan for admission to [place] for further hydration and monitoring of his renal function.  He potentially will  need ureteral stenting if renal function not improving.   Final Clinical  Impression(s) / ED Diagnoses Final diagnoses:  Ureterolithiasis  AKI (acute kidney injury) [facility])    Rx / DC Orders ED Discharge Orders    None       Hayden Rasmussen, MD 02/05/20 1753

## 2020-02-05 NOTE — Transfer of Care (Signed)
Immediate Anesthesia Transfer of Care Note  Patient: Shawn Hernandez  Procedure(s) Performed: CYSTOSCOPY WITH RETROGRADE PYELOGRAM/URETERAL STENT PLACEMENT (Bilateral Urethra)  Patient Location: PACU  Anesthesia Type:General  Level of Consciousness: drowsy and patient cooperative  Airway & Oxygen Therapy: Patient Spontanous Breathing and Patient connected to face mask oxygen  Post-op Assessment: Report given to RN and Post -op Vital signs reviewed and stable  Post vital signs: Reviewed and stable  Last Vitals:  Vitals Value Taken Time  BP 127/72 02/05/20 2231  Temp    Pulse 83 02/05/20 2233  Resp 19 02/05/20 2233  SpO2 100 % 02/05/20 2233  Vitals shown include unvalidated device data.  Last Pain:  Vitals:   02/05/20 2059  TempSrc: Oral  PainSc:          Complications: No complications documented.

## 2020-02-05 NOTE — Consult Note (Signed)
Reason for Consult: Left Ureteral / Bilateral Renal Stones, Acute Renal Failure  Referring Physician: Orson Eva MD  Shawn Hernandez is an 69 y.o. male.   HPI:   1 - Left Ureteral / Bilateral Renal Stones - left 60mm proxial stone with mild hydro and scattered Rt renal stones (no hydro, abour 44mm total volume) by ER CT at [facility] on eval few days left flank pain. No fevers. UA without infectious parameters.   2 - Acute on Chronic Renal Failure -  Cr 2.5, normal K up from baseline Cr 1.5's by Er labs.   PMH sig for AFib/Eliquus, partial colectomy with re-anastamosis, left inguinal hernia repair with mesh, DM2, obesity. His PCP is [name] MD.   Today " Shawn Hernandez " is seen for opinion on above. Last meal 2 days ago. C19 screen negative.   Past Medical History:  Diagnosis Date  . Allergy to iodine   . Arthritis    discomfort in hands and arms  . Atrial flutter, paroxysmal ([place])   . Coronary artery disease   . Diabetes type 2, controlled ([place]) 10/2016  . Diverticulosis   . Hypercholesterolemia   . Hypertension   . Psoriatic arthritis ([place])   . Sinusitis     Past Surgical History:  Procedure Laterality Date  . A-FLUTTER ABLATION N/A 10/17/2017   Procedure: A-FLUTTER ABLATION;  Surgeon: Constance Haw, MD;  Location: [place];  Service: Cardiovascular;  Laterality: N/A;  . COLON SURGERY  2001   2 feet removed re- diverticulosis  . CORONARY STENT PLACEMENT  2011   sequential bare-metal  . INGUINAL HERNIA REPAIR    . OTHER SURGICAL HISTORY     appendectomy    Family History  Problem Relation Age of Onset  . Heart disease Mother   . Heart disease Father   . Heart disease Brother   . Diabetes Brother     Social History:  reports that he has never smoked. He has never used smokeless tobacco. No history on file for alcohol use and drug use.  Allergies:  Allergies  Allergen Reactions  . Ivp Dye [Iodinated Diagnostic Agents] Hives  . Lisinopril Other  (See Comments)    cough  . Statins Other (See Comments)    myalgias  . Iodine Rash    Medications: I have reviewed the patient's current medications.  Results for orders placed or performed during the hospital encounter of 02/05/20 (from the past 48 hour(s))  Urinalysis, Routine w reflex microscopic Urine, Random     Status: Abnormal   Collection Time: 02/05/20  8:35 AM  Result Value Ref Range   Color, Urine YELLOW YELLOW   APPearance CLEAR CLEAR   Specific Gravity, Urine 1.012 1.005 - 1.030   pH 6.0 5.0 - 8.0   Glucose, UA NEGATIVE NEGATIVE mg/dL   Hgb urine dipstick LARGE (A) NEGATIVE   Bilirubin Urine NEGATIVE NEGATIVE   Ketones, ur NEGATIVE NEGATIVE mg/dL   Protein, ur NEGATIVE NEGATIVE mg/dL   Nitrite NEGATIVE NEGATIVE   Leukocytes,Ua NEGATIVE NEGATIVE   RBC / HPF >50 (H) 0 - 5 RBC/hpf   WBC, UA 0-5 0 - 5 WBC/hpf   Bacteria, UA NONE SEEN NONE SEEN   Mucus PRESENT     Comment: Performed at [facility], [address], [place], [place] [address]  CBC with Differential     Status: Abnormal   Collection Time: 02/05/20  9:05 AM  Result Value Ref Range   WBC 12.0 (H) 4.0 -  10.5 K/uL   RBC 4.58 4.22 - 5.81 MIL/uL   Hemoglobin 14.3 13.0 - 17.0 g/dL   HCT 94.1 74.0 - 81.4 %   MCV 93.2 80.0 - 100.0 fL   MCH 31.2 26.0 - 34.0 pg   MCHC 33.5 30.0 - 36.0 g/dL   RDW 48.1 85.6 - 31.4 %   Platelets 303 150 - 400 K/uL   nRBC 0.0 0.0 - 0.2 %   Neutrophils Relative % 70 %   Neutro Abs 8.5 (H) 1.7 - 7.7 K/uL   Lymphocytes Relative 17 %   Lymphs Abs 2.0 0.7 - 4.0 K/uL   Monocytes Relative 10 %   Monocytes Absolute 1.2 (H) 0.1 - 1.0 K/uL   Eosinophils Relative 2 %   Eosinophils Absolute 0.2 0.0 - 0.5 K/uL   Basophils Relative 0 %   Basophils Absolute 0.1 0.0 - 0.1 K/uL   Immature Granulocytes 1 %   Abs Immature Granulocytes 0.07 0.00 - 0.07 K/uL    Comment: Performed at [facility], [address] [place], [place] [address]  Comprehensive metabolic panel     Status: Abnormal    Collection Time: 02/05/20  9:05 AM  Result Value Ref Range   Sodium 132 (L) 135 - 145 mmol/L   Potassium 3.8 3.5 - 5.1 mmol/L   Chloride 101 98 - 111 mmol/L   CO2 23 22 - 32 mmol/L   Glucose, Bld 153 (H) 70 - 99 mg/dL    Comment: Glucose reference range applies only to samples taken after fasting for at least 8 hours.   BUN 22 8 - 23 mg/dL   Creatinine, Ser 3.78 (H) 0.61 - 1.24 mg/dL   Calcium 9.0 8.9 - 58.8 mg/dL   Total Protein 7.3 6.5 - 8.1 g/dL   Albumin 4.3 3.5 - 5.0 g/dL   AST 19 15 - 41 U/L   ALT 21 0 - 44 U/L   Alkaline Phosphatase 39 38 - 126 U/L   Total Bilirubin 1.0 0.3 - 1.2 mg/dL   GFR, Estimated 24 (L) >60 mL/min    Comment: (NOTE) Calculated using the CKD-EPI Creatinine Equation (2021)    Anion gap 8 5 - 15    Comment: Performed at [facility], [address] [place], [place] [address]  Lipase, blood     Status: None   Collection Time: 02/05/20  9:05 AM  Result Value Ref Range   Lipase 36 11 - 51 U/L    Comment: Performed at [facility], [address], [place], [place] [address]  Resp Panel by RT-PCR (Flu A&B, Covid) Nasopharyngeal Swab     Status: None   Collection Time: 02/05/20 11:17 AM   Specimen: Nasopharyngeal Swab; Nasopharyngeal(NP) swabs in vial transport medium  Result Value Ref Range   SARS Coronavirus 2 by RT PCR NEGATIVE NEGATIVE    Comment: (NOTE) SARS-CoV-2 target nucleic acids are NOT DETECTED.  The SARS-CoV-2 RNA is generally detectable in upper respiratory specimens during the acute phase of infection. The lowest concentration of SARS-CoV-2 viral copies this assay can detect is 138 copies/mL. A negative result does not preclude SARS-Cov-2 infection and should not be used as the sole basis for treatment or other patient management decisions. A negative result may occur with  improper specimen collection/handling, submission of specimen other than nasopharyngeal swab, presence of viral mutation(s) within the areas targeted by this assay,  and inadequate number of viral copies(<138 copies/mL). A negative result must be combined with clinical observations, patient history, and epidemiological  information. The expected result is Negative.  Fact Sheet for Patients:  BloggerCourse.com  Fact Sheet for Healthcare Providers:  SeriousBroker.it  This test is no t yet approved or cleared by the [place] FDA and  has been authorized for detection and/or diagnosis of SARS-CoV-2 by FDA under an Emergency Use Authorization (EUA). This EUA will remain  in effect (meaning this test can be used) for the duration of the COVID-19 declaration under Section 564(b)(1) of the Act, 21 U.S.C.section 360bbb-3(b)(1), unless the authorization is terminated  or revoked sooner.       Influenza A by PCR NEGATIVE NEGATIVE   Influenza B by PCR NEGATIVE NEGATIVE    Comment: (NOTE) The Xpert Xpress SARS-CoV-2/FLU/RSV plus assay is intended as an aid in the diagnosis of influenza from Nasopharyngeal swab specimens and should not be used as a sole basis for treatment. Nasal washings and aspirates are unacceptable for Xpert Xpress SARS-CoV-2/FLU/RSV testing.  Fact Sheet for Patients: BloggerCourse.com  Fact Sheet for Healthcare Providers: SeriousBroker.it  This test is not yet approved or cleared by the [place] FDA and has been authorized for detection and/or diagnosis of SARS-CoV-2 by FDA under an Emergency Use Authorization (EUA). This EUA will remain in effect (meaning this test can be used) for the duration of the COVID-19 declaration under Section 564(b)(1) of the Act, 21 U.S.C. section 360bbb-3(b)(1), unless the authorization is terminated or revoked.  Performed at [facility], [address] [place], [place] [address]   Culture, blood (Routine X 2) w Reflex to ID Panel     Status: None (Preliminary result)   Collection Time:  02/05/20  1:28 PM   Specimen: BLOOD  Result Value Ref Range   Specimen Description BLOOD LEFT ANTECUBITAL    Special Requests      BOTTLES DRAWN AEROBIC AND ANAEROBIC Blood Culture adequate volume Performed at [facility], [address] [place], [place] [address]    Culture PENDING    Report Status PENDING     CT Abdomen Pelvis Wo Contrast  Result Date: 02/05/2020 CLINICAL DATA:  LEFT-sided abdominal pain since Wednesday night. EXAM: CT ABDOMEN AND PELVIS WITHOUT CONTRAST TECHNIQUE: Multidetector CT imaging of the abdomen and pelvis was performed following the standard protocol without IV contrast. COMPARISON:  CT abdomen dated 10/19/2019 FINDINGS: Lower chest: Mild bibasilar atelectasis. Hepatobiliary: Liver is diffusely low in density indicating fatty infiltration. No focal liver abnormality is seen. Gallbladder is unremarkable. No bile duct dilatation. Pancreas: Unremarkable. No pancreatic ductal dilatation or surrounding inflammatory changes. Spleen: Normal in size without focal abnormality. Adrenals/Urinary Tract: Moderate LEFT-sided hydronephrosis. Proximal LEFT ureteral stone measures 4.5 x 4 mm. No additional ureteral stone. Bladder is unremarkable, partially decompressed. 2 mm LEFT renal stone. 8 mm and 4 mm nonobstructing RIGHT renal stones. Stomach/Bowel: Surgical changes of a partial colectomy at the level of the sigmoid colon. No dilated large or small bowel loops. Mild diverticulosis of the LEFT colon without focal inflammation to suggest acute diverticulitis. No evidence of bowel wall inflammation. Stomach is unremarkable. Vascular/Lymphatic: Aortic atherosclerosis. No enlarged lymph nodes are seen. Reproductive: Prostate gland is not enlarged. Other: No free fluid or abscess collection. No free intraperitoneal air. Midline supraumbilical abdominal wall hernia which contains fat only. Musculoskeletal: No acute or suspicious osseous finding. Trabecular thickening of the LEFT-sided  pubic rami and LEFT acetabulum suggesting some degree of Paget's disease. Mild degenerative spondylosis of the thoracolumbar spine. IMPRESSION: 1. Proximal LEFT ureteral stone measures 4.5 x 4 mm, causing moderate LEFT-sided hydronephrosis and  perinephric edema. 2. Bilateral nephrolithiasis. 3. Fatty infiltration of the liver. 4. Colonic diverticulosis without evidence of acute diverticulitis. 5. Midline supraumbilical abdominal wall hernia which contains fat only. 6. 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 Aortic Atherosclerosis (ICD10-I70.0). Electronically Signed   By: Franki Cabot M.D.   On: 02/05/2020 11:40    Review of Systems Blood pressure (!) 151/89, pulse 78, temperature 98.2 F (36.8 C), temperature source Oral, resp. rate 16, height 6' (1.829 m), weight 111.1 kg, SpO2 99 %. Physical Exam  NAD Wears glasses Non-labored breathing on RA Minimal CVAT at present Obese abdomen No c/c/e.  Non-focal neurol AOx3  Assessment/Plan:  1 - Left Ureteral / Bilateral Renal Stones - discussed options of cysto bilateral stent in semi urgent setting for renal decompression followed by bilateral ureteroscopy to stone free as most definitive option v. Judicious trial of medical passage v. Unilateral treatment.   He opts for bilateral stents today and I agree, then bilateral ureteroscopy in elective setting after renal function recovery.   2 - Acute on Chronic Renal Failure -  Some unilateral obstruction + intrisic medical renal disease. Rec plan for decompression as per above.     [name] 02/05/2020, 3:53 PM

## 2020-02-05 NOTE — ED Notes (Signed)
[facility] at bedside 

## 2020-02-05 NOTE — Anesthesia Preprocedure Evaluation (Addendum)
Anesthesia Evaluation  Patient identified by MRN, date of birth, ID band Patient awake    Reviewed: Allergy & Precautions, NPO status , Patient's Chart, lab work & pertinent test results  Airway Mallampati: III  TM Distance: >3 FB Neck ROM: Full    Dental no notable dental hx. (+) Teeth Intact, Dental Advisory Given   Pulmonary neg pulmonary ROS,    Pulmonary exam normal breath sounds clear to auscultation       Cardiovascular Exercise Tolerance: Good hypertension, Pt. on medications and Pt. on home beta blockers + CAD  Normal cardiovascular exam+ dysrhythmias Atrial Fibrillation  Rhythm:Regular Rate:Normal  Myo Perfusion 19'  1. EF 54%, low normal to mildly reduced.  2. No evidence for ischemia or infarction on perfusion images.      Neuro/Psych 10/21 Carotid dopplers are normal. Normal vertebral flow  Neuromuscular disease negative psych ROS   GI/Hepatic negative GI ROS, Neg liver ROS,   Endo/Other  diabetes, Type 2, Oral Hypoglycemic Agents  Renal/GU Renal disease     Musculoskeletal  (+) Arthritis , Osteoarthritis,    Abdominal (+) + obese,   Peds  Hematology   Anesthesia Other Findings   Reproductive/Obstetrics                            Lab Results  Component Value Date   CREATININE 2.72 (H) 02/05/2020   BUN 22 02/05/2020   NA 132 (L) 02/05/2020   K 3.8 02/05/2020   CL 101 02/05/2020   CO2 23 02/05/2020    Lab Results  Component Value Date   WBC 12.0 (H) 02/05/2020   HGB 14.3 02/05/2020   HCT 42.7 02/05/2020   MCV 93.2 02/05/2020   PLT 303 02/05/2020    Anesthesia Physical  Anesthesia Plan  ASA: III and emergent  Anesthesia Plan: General   Post-op Pain Management:    Induction: Intravenous  PONV Risk Score and Plan: 3 and Treatment may vary due to age or medical condition, Dexamethasone and Ondansetron  Airway Management Planned: Oral ETT and  LMA  Additional Equipment:   Intra-op Plan:   Post-operative Plan: Extubation in OR  Informed Consent: I have reviewed the patients History and Physical, chart, labs and discussed the procedure including the risks, benefits and alternatives for the proposed anesthesia with the patient or authorized representative who has indicated his/her understanding and acceptance.     Dental advisory given  Plan Discussed with: CRNA  Anesthesia Plan Comments:         Anesthesia Quick Evaluation

## 2020-02-05 NOTE — ED Notes (Signed)
Called [facility] for transport to [facility].

## 2020-02-05 NOTE — ED Notes (Signed)
Dr Tat at bedside, informed that Wynona Meals was negative.

## 2020-02-06 DIAGNOSIS — I10 Essential (primary) hypertension: Secondary | ICD-10-CM

## 2020-02-06 DIAGNOSIS — I2510 Atherosclerotic heart disease of native coronary artery without angina pectoris: Secondary | ICD-10-CM

## 2020-02-06 DIAGNOSIS — E785 Hyperlipidemia, unspecified: Secondary | ICD-10-CM

## 2020-02-06 DIAGNOSIS — K5900 Constipation, unspecified: Secondary | ICD-10-CM | POA: Diagnosis not present

## 2020-02-06 DIAGNOSIS — N201 Calculus of ureter: Secondary | ICD-10-CM | POA: Diagnosis not present

## 2020-02-06 DIAGNOSIS — N179 Acute kidney failure, unspecified: Secondary | ICD-10-CM | POA: Diagnosis not present

## 2020-02-06 DIAGNOSIS — N139 Obstructive and reflux uropathy, unspecified: Secondary | ICD-10-CM | POA: Diagnosis not present

## 2020-02-06 DIAGNOSIS — E1169 Type 2 diabetes mellitus with other specified complication: Secondary | ICD-10-CM

## 2020-02-06 LAB — BASIC METABOLIC PANEL
Anion gap: 11 (ref 5–15)
BUN: 22 mg/dL (ref 8–23)
CO2: 23 mmol/L (ref 22–32)
Calcium: 8.8 mg/dL — ABNORMAL LOW (ref 8.9–10.3)
Chloride: 100 mmol/L (ref 98–111)
Creatinine, Ser: 2.62 mg/dL — ABNORMAL HIGH (ref 0.61–1.24)
GFR, Estimated: 25 mL/min — ABNORMAL LOW (ref >60)
Glucose, Bld: 227 mg/dL — ABNORMAL HIGH (ref 70–99)
Potassium: 4.7 mmol/L (ref 3.5–5.1)
Sodium: 134 mmol/L — ABNORMAL LOW (ref 135–145)

## 2020-02-06 LAB — HEMOGLOBIN A1C
Hgb A1c MFr Bld: 6.5 % — ABNORMAL HIGH (ref 4.8–5.6)
Mean Plasma Glucose: 139.85 mg/dL

## 2020-02-06 LAB — CBC
HCT: 39.2 % (ref 39.0–52.0)
Hemoglobin: 13.1 g/dL (ref 13.0–17.0)
MCH: 31.3 pg (ref 26.0–34.0)
MCHC: 33.4 g/dL (ref 30.0–36.0)
MCV: 93.8 fL (ref 80.0–100.0)
Platelets: 274 10*3/uL (ref 150–400)
RBC: 4.18 MIL/uL — ABNORMAL LOW (ref 4.22–5.81)
RDW: 11.8 % (ref 11.5–15.5)
WBC: 11.1 10*3/uL — ABNORMAL HIGH (ref 4.0–10.5)
nRBC: 0 % (ref 0.0–0.2)

## 2020-02-06 LAB — GLUCOSE, CAPILLARY
Glucose-Capillary: 248 mg/dL — ABNORMAL HIGH (ref 70–99)
Glucose-Capillary: 147 mg/dL — ABNORMAL HIGH (ref 70–99)

## 2020-02-06 MED ORDER — ALUM & MAG HYDROXIDE-SIMETH 200-200-20 MG/5 ML NICU TOPICAL: 1.0000 "application " | TOPICAL | Status: DC | PRN

## 2020-02-06 MED ORDER — AMLODIPINE BESYLATE 5 MG PO TABS
5.0000 mg | ORAL_TABLET | Freq: Every day | ORAL | Status: DC
  Administered 2020-02-06 – 2020-02-07 (×2): 5 mg via ORAL
  Filled 2020-02-06 (×2): qty 1

## 2020-02-06 MED ORDER — SODIUM CHLORIDE 0.9 % IV SOLN
2.0000 g | Freq: Every day | INTRAVENOUS | Status: DC
  Administered 2020-02-06 – 2020-02-07 (×2): 2 g via INTRAVENOUS
  Filled 2020-02-06: qty 2
  Filled 2020-02-06: qty 1

## 2020-02-06 MED ORDER — POLYETHYLENE GLYCOL 3350 17 G PO PACK
17.0000 g | PACK | Freq: Two times a day (BID) | ORAL | Status: DC
  Administered 2020-02-06 – 2020-02-07 (×3): 17 g via ORAL
  Filled 2020-02-06 (×3): qty 1

## 2020-02-06 MED ORDER — BISACODYL 10 MG RE SUPP
10.0000 mg | Freq: Once | RECTAL | Status: AC
  Administered 2020-02-06: 10 mg via RECTAL
  Filled 2020-02-06: qty 1

## 2020-02-06 MED ORDER — SODIUM CHLORIDE 0.9 % IV SOLN: INTRAVENOUS | Status: DC

## 2020-02-06 MED ORDER — INSULIN GLARGINE 100 UNIT/ML ~~LOC~~ SOLN
5.0000 [IU] | Freq: Every day | SUBCUTANEOUS | Status: DC
  Administered 2020-02-06 – 2020-02-07 (×2): 5 [IU] via SUBCUTANEOUS
  Filled 2020-02-06 (×2): qty 0.05

## 2020-02-06 MED ORDER — SENNOSIDES-DOCUSATE SODIUM 8.6-50 MG PO TABS
1.0000 | ORAL_TABLET | Freq: Two times a day (BID) | ORAL | Status: DC
  Administered 2020-02-06 – 2020-02-07 (×3): 1 via ORAL
  Filled 2020-02-06 (×3): qty 1

## 2020-02-06 MED ORDER — ALUM & MAG HYDROXIDE-SIMETH 200-200-20 MG/5ML PO SUSP
30.0000 mL | ORAL | Status: DC | PRN
  Administered 2020-02-06 (×2): 30 mL via ORAL
  Filled 2020-02-06 (×2): qty 30

## 2020-02-06 NOTE — Progress Notes (Signed)
PROGRESS NOTE    Shawn Hernandez  QIH:[id number] DOB: July 16, 1950 DOA: 02/05/2020 PCP: Georgianne Fick, MD    Chief Complaint  Patient presents with  . Abdominal Pain    Brief Narrative:  HPI per Dr. Berna Spare is a 70 y.o. male with medical history of diabetes mellitus type 2, hypertension, paroxysmal atrial fibrillation on apixaban, CKD stage III, coronary artery disease, hyperlipidemia presenting with left lower quadrant abdominal pain that began on the evening of 02/03/2020.  The patient tried using some over-the-counter laxatives.  He did have a bowel movement, but this did not improve his pain.  He had some nausea without any emesis.  He had a temperature 100.3 F on the evening of 02/04/2020.  He has not had any hematochezia melena.  However, because of worsening abdominal pain, the patient presented for further evaluation.  He denies any headache, chest pain, shortness breath, hemoptysis, hematemesis, dysuria. In the emergency department, the patient had low-grade temperature of 99.2 F.  He was hemodynamically stable.  Oxygen saturation was 95% on room air.  CT of the abdomen and pelvis showed a proximal left ureteral stone measuring 4.5 x 4 mm with left hydronephrosis and perinephric edema.  BMP showed a sodium 132, potassium 3.8, serum creatinine 2.72.  LFTs were unremarkable.  WBC 12.0, hemoglobin 14.3, platelets 203,000.  The patient was started on intravenous fluids.  Urology was consulted and recommended transfer to [facility] for possible ureteral stent.  Assessment & Plan:   Active Problems:   Hyponatremia   Acute renal failure superimposed on stage 3b chronic kidney disease, unspecified acute renal failure type (HCC)   Acute renal failure superimposed on chronic kidney disease, unspecified CKD stage, unspecified acute renal failure type (HCC)   Acute renal failure superimposed on stage 3a chronic kidney disease (HCC)   Left ureteral stone   Obstructive uropathy    Acute renal failure (ARF) (HCC)   1 acute kidney injury on chronic kidney disease stage IIIa Secondary to obstructive uropathy secondary to left ureteral stone.  Baseline creatinine 1.4-1.5. CT abdomen and pelvis done on admission with proximal left ureteral stone 4.5 x 4 mm with left hydronephrosis and perinephric edema.  Urinalysis nitrite negative, leukocytes negative, 0-5 WBCs, greater than 50 RBCs.  Blood cultures pending.  Urine cultures pending.  Place empirically on IV Rocephin.  Creatinine slowly trending down with hydration.  Continue IV fluids.  Continue to hold ARB.  Supportive care.  2.  Left ureteral stone with hydronephrosis/bilateral renal stones Noted on CT abdomen and pelvis.  Patient had presented with flank pain, hematuria, acute kidney injury on chronic kidney disease.  CT abdomen and pelvis done with proximal left ureteral stone 4.5 x 4 mm with left hydronephrosis and perinephric edema.  Patient pancultured.  Patient seen in consultation by urology and patient underwent cystoscopy with retrograde pyelogram/ureteral stent placement bilaterally per Dr. Berneice Heinrich (02/05/2020 ).  Place empirically on IV Rocephin.  Per urology.  3.  Paroxysmal atrial fibrillation Currently in sinus rhythm.  Dronedarone resumed.  Apixaban on hold on admission in anticipation of stent placement.  Urology to advise when apixaban may be resumed.  4.  Hypertension Continue to hold ARB.  Start Norvasc 5 mg daily.  Follow.  5.  Well-controlled diabetes mellitus type II CBG 248 this morning.  Hemoglobin A1c 6.5(02/06/2020).  Continue to hold oral hypoglycemic agents.  Start Lantus 5 units daily.  Sliding scale insulin.  Follow.  6.  Coronary artery disease Stable.  Patient  status post 2 bare-metal stents to OM in 2010 with residual 50% LAD and 70% RCA stenosis.  Continue Zetia, fenofibrate.  Apixaban on hold and urology to advise when it may be resumed.  Outpatient follow-up.  7.  Hyperlipidemia Continue  Zetia, Crestor.  6.  Constipation Dulcolax suppository x1.  MiraLAX twice daily, Senokot-S twice daily.   DVT prophylaxis: SCDs Code Status: Full Family Communication: Updated patient, no family at bedside Disposition:   Status is: Inpatient    Dispo: The patient is from: Home              Anticipated d/c is to: Home              Anticipated d/c date is: 2 to 3 days              Patient currently status post bilateral ureteral stent placement, on IV antibiotics, not stable for discharge.       Consultants:   Urology: Dr. Tresa Moore 02/05/2020  Procedures:   CT abdomen and pelvis 02/05/2020  Cystoscopy with retrograde pyelogram/ureteral stent placement bilaterally per Dr. Tresa Moore 02/05/2020  Antimicrobials:   IV Rocephin 02/06/2020>>>>   Subjective: Patient sitting up in bed.  Denies any flank pain.  Complaining of lower abdominal discomfort/bloating.  Complain of constipation.  States last bowel movement was 5 days ago.  Denies any nausea or vomiting.  No shortness of breath.  No chest pain.  Overall feeling better than on admission.  Patient with some urine output with hematuria.  Objective: Vitals:   02/06/20 0207 02/06/20 0400 02/06/20 0400 02/06/20 0949  BP: (!) 170/82 (!) 145/80 (!) 145/80 (!) 174/85  Pulse: 82 81 80 79  Resp: 16 16 16    Temp: 98.8 F (37.1 C) 98.2 F (36.8 C) 98.2 F (36.8 C) 97.6 F (36.4 C)  TempSrc: Oral Oral Oral Oral  SpO2: 90% 91% 92% 95%  Weight:      Height:        Intake/Output Summary (Last 24 hours) at 02/06/2020 1117 Last data filed at 02/06/2020 1000 Gross per 24 hour  Intake 1315.38 ml  Output 450 ml  Net 865.38 ml   Filed Weights   02/05/20 0818  Weight: 111.1 kg    Examination:  General exam: Appears calm and comfortable  Respiratory system: Clear to auscultation. Respiratory effort normal. Cardiovascular system: S1 & S2 heard, RRR. No JVD, murmurs, rubs, gallops or clicks. No pedal edema. Gastrointestinal system:  Abdomen is nondistended, soft and some decreased tenderness in the suprapubic region.  No rebound.  No guarding.  Normal bowel sounds. Central nervous system: Alert and oriented. No focal neurological deficits. Extremities: Symmetric 5 x 5 power. Skin: No rashes, lesions or ulcers Psychiatry: Judgement and insight appear normal. Mood & affect appropriate.     Data Reviewed: I have personally reviewed following labs and imaging studies  CBC: Recent Labs  Lab 02/05/20 0905 02/06/20 0345  WBC 12.0* 11.1*  NEUTROABS 8.5*  --   HGB 14.3 13.1  HCT 42.7 39.2  MCV 93.2 93.8  PLT 303 [id number]    Basic Metabolic Panel: Recent Labs  Lab 02/05/20 0905 02/06/20 0345  NA 132* 134*  K 3.8 4.7  CL 101 100  CO2 23 23  GLUCOSE 153* 227*  BUN 22 22  CREATININE 2.72* 2.62*  CALCIUM 9.0 8.8*    GFR: Estimated Creatinine Clearance: 33.8 mL/min (A) (by C-G formula based on SCr of 2.62 mg/dL (H)).  Liver Function Tests: Recent Labs  Lab 02/05/20 0905  AST 19  ALT 21  ALKPHOS 39  BILITOT 1.0  PROT 7.3  ALBUMIN 4.3    CBG: Recent Labs  Lab 02/05/20 2233 02/06/20 0731  GLUCAP 123* 248*     Recent Results (from the past 240 hour(s))  Resp Panel by RT-PCR (Flu A&B, Covid) Nasopharyngeal Swab     Status: None   Collection Time: 02/05/20 11:17 AM   Specimen: Nasopharyngeal Swab; Nasopharyngeal(NP) swabs in vial transport medium  Result Value Ref Range Status   SARS Coronavirus 2 by RT PCR NEGATIVE NEGATIVE Final    Comment: (NOTE) SARS-CoV-2 target nucleic acids are NOT DETECTED.  The SARS-CoV-2 RNA is generally detectable in upper respiratory specimens during the acute phase of infection. The lowest concentration of SARS-CoV-2 viral copies this assay can detect is 138 copies/mL. A negative result does not preclude SARS-Cov-2 infection and should not be used as the sole basis for treatment or other patient management decisions. A negative result may occur with  improper  specimen collection/handling, submission of specimen other than nasopharyngeal swab, presence of viral mutation(s) within the areas targeted by this assay, and inadequate number of viral copies(<138 copies/mL). A negative result must be combined with clinical observations, patient history, and epidemiological information. The expected result is Negative.  Fact Sheet for Patients:  EntrepreneurPulse.com.au  Fact Sheet for Healthcare Providers:  IncredibleEmployment.be  This test is no t yet approved or cleared by the [place] FDA and  has been authorized for detection and/or diagnosis of SARS-CoV-2 by FDA under an Emergency Use Authorization (EUA). This EUA will remain  in effect (meaning this test can be used) for the duration of the COVID-19 declaration under Section 564(b)(1) of the Act, 21 U.S.C.section 360bbb-3(b)(1), unless the authorization is terminated  or revoked sooner.       Influenza A by PCR NEGATIVE NEGATIVE Final   Influenza B by PCR NEGATIVE NEGATIVE Final    Comment: (NOTE) The Xpert Xpress SARS-CoV-2/FLU/RSV plus assay is intended as an aid in the diagnosis of influenza from Nasopharyngeal swab specimens and should not be used as a sole basis for treatment. Nasal washings and aspirates are unacceptable for Xpert Xpress SARS-CoV-2/FLU/RSV testing.  Fact Sheet for Patients: EntrepreneurPulse.com.au  Fact Sheet for Healthcare Providers: IncredibleEmployment.be  This test is not yet approved or cleared by the [place] FDA and has been authorized for detection and/or diagnosis of SARS-CoV-2 by FDA under an Emergency Use Authorization (EUA). This EUA will remain in effect (meaning this test can be used) for the duration of the COVID-19 declaration under Section 564(b)(1) of the Act, 21 U.S.C. section 360bbb-3(b)(1), unless the authorization is terminated or revoked.  Performed at  [facility], [address], [place], [place] [address]   Culture, blood (Routine X 2) w Reflex to ID Panel     Status: None (Preliminary result)   Collection Time: 02/05/20  1:28 PM   Specimen: BLOOD  Result Value Ref Range Status   Specimen Description BLOOD LEFT ANTECUBITAL  Final   Special Requests   Final    BOTTLES DRAWN AEROBIC AND ANAEROBIC Blood Culture adequate volume   Culture   Final    NO GROWTH < 24 HOURS Performed at [facility], [address] [place], [place] [address]    Report Status PENDING  Incomplete  Culture, blood (Routine X 2) w Reflex to ID Panel     Status: None (Preliminary result)   Collection Time: 02/05/20  1:30 PM   Specimen:  BLOOD  Result Value Ref Range Status   Specimen Description BLOOD  Final   Special Requests NONE  Final   Culture   Final    NO GROWTH < 24 HOURS Performed at [facility], [address] [place], [place] [address]    Report Status PENDING  Incomplete         Radiology Studies: CT Abdomen Pelvis Wo Contrast  Result Date: 02/05/2020 CLINICAL DATA:  LEFT-sided abdominal pain since Wednesday night. EXAM: CT ABDOMEN AND PELVIS WITHOUT CONTRAST TECHNIQUE: Multidetector CT imaging of the abdomen and pelvis was performed following the standard protocol without IV contrast. COMPARISON:  CT abdomen dated 10/19/2019 FINDINGS: Lower chest: Mild bibasilar atelectasis. Hepatobiliary: Liver is diffusely low in density indicating fatty infiltration. No focal liver abnormality is seen. Gallbladder is unremarkable. No bile duct dilatation. Pancreas: Unremarkable. No pancreatic ductal dilatation or surrounding inflammatory changes. Spleen: Normal in size without focal abnormality. Adrenals/Urinary Tract: Moderate LEFT-sided hydronephrosis. Proximal LEFT ureteral stone measures 4.5 x 4 mm. No additional ureteral stone. Bladder is unremarkable, partially decompressed. 2 mm LEFT renal stone. 8 mm and 4 mm nonobstructing RIGHT renal stones.  Stomach/Bowel: Surgical changes of a partial colectomy at the level of the sigmoid colon. No dilated large or small bowel loops. Mild diverticulosis of the LEFT colon without focal inflammation to suggest acute diverticulitis. No evidence of bowel wall inflammation. Stomach is unremarkable. Vascular/Lymphatic: Aortic atherosclerosis. No enlarged lymph nodes are seen. Reproductive: Prostate gland is not enlarged. Other: No free fluid or abscess collection. No free intraperitoneal air. Midline supraumbilical abdominal wall hernia which contains fat only. Musculoskeletal: No acute or suspicious osseous finding. Trabecular thickening of the LEFT-sided pubic rami and LEFT acetabulum suggesting some degree of Paget's disease. Mild degenerative spondylosis of the thoracolumbar spine. IMPRESSION: 1. Proximal LEFT ureteral stone measures 4.5 x 4 mm, causing moderate LEFT-sided hydronephrosis and perinephric edema. 2. Bilateral nephrolithiasis. 3. Fatty infiltration of the liver. 4. Colonic diverticulosis without evidence of acute diverticulitis. 5. Midline supraumbilical abdominal wall hernia which contains fat only. 6. 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 Aortic Atherosclerosis (ICD10-I70.0). Electronically Signed   By: Franki Cabot M.D.   On: 02/05/2020 11:40   DG C-Arm 1-60 Min-No Report  Result Date: 02/05/2020 Fluoroscopy was utilized by the requesting physician.  No radiographic interpretation.        Scheduled Meds: . amLODipine  5 mg Oral Daily  . bisacodyl  10 mg Rectal Once  . dronedarone  400 mg Oral BID WC  . ezetimibe  10 mg Oral Daily  . fenofibrate  160 mg Oral Daily  . insulin aspart  0-5 Units Subcutaneous QHS  . insulin aspart  0-9 Units Subcutaneous TID WC  . insulin glargine  5 Units Subcutaneous Daily  . polyethylene glycol  17 g Oral BID  . rosuvastatin  5 mg Oral Daily  . senna-docusate  1 tablet Oral BID   Continuous Infusions: . sodium chloride 100 mL/hr at  02/06/20 1001  . cefTRIAXone (ROCEPHIN)  IV 2 g (02/06/20 1009)     LOS: 1 day    Time spent: 40 minutes    Irine Seal, MD [facility]   To contact the attending provider between 7A-7P or the covering provider during after hours 7P-7A, please log into the web site [email or web address] and access using universal [place] password for that web site. If you do not have the password, please call the hospital operator.  02/06/2020, 11:17 AM

## 2020-02-06 NOTE — Anesthesia Postprocedure Evaluation (Signed)
Anesthesia Post Note  Patient: Shawn Hernandez  Procedure(s) Performed: CYSTOSCOPY WITH RETROGRADE PYELOGRAM/URETERAL STENT PLACEMENT (Bilateral Urethra)     Patient location during evaluation: PACU Anesthesia Type: General Level of consciousness: awake and alert Pain management: pain level controlled Vital Signs Assessment: post-procedure vital signs reviewed and stable Respiratory status: spontaneous breathing, nonlabored ventilation, respiratory function stable and patient connected to nasal cannula oxygen Cardiovascular status: blood pressure returned to baseline and stable Postop Assessment: no apparent nausea or vomiting Anesthetic complications: no   No complications documented.  Last Vitals:  Vitals:   02/06/20 0400 02/06/20 0400  BP: (!) 145/80 (!) 145/80  Pulse: 81 80  Resp: 16 16  Temp: 36.8 C 36.8 C  SpO2: 91% 92%    Last Pain:  Vitals:   02/06/20 0530  TempSrc:   PainSc: Asleep                 Demontrae Gilbert

## 2020-02-06 NOTE — Progress Notes (Signed)
1 Day Post-Op Subjective: Patient reports feeling much better no further pain in the flank area.  Continues to have some hematuria as expected but voiding satisfactorily and seems to be tolerating the stents.  Objective: Vital signs in last 24 hours: Temp:  [98.2 F (36.8 C)-99.6 F (37.6 C)] 98.2 F (36.8 C) (01/01 0400) Pulse Rate:  [71-95] 80 (01/01 0400) Resp:  [13-18] 16 (01/01 0400) BP: (112-183)/(60-98) 145/80 (01/01 0400) SpO2:  [90 %-99 %] 92 % (01/01 0400) Weight:  [111.1 kg] 111.1 kg (12/31 0818)  Intake/Output from previous day: 12/31 0701 - 01/01 0700 In: 2315.4 [I.V.:1265.4; IV Piggyback:1050] Out: 0  Intake/Output this shift: No intake/output data recorded.  Physical Exam:  General:alert and cooperative GI: not done Male genitalia: not done   Lab Results: Recent Labs    02/05/20 0905 02/06/20 0345  HGB 14.3 13.1  HCT 42.7 39.2   BMET Recent Labs    02/05/20 0905 02/06/20 0345  NA 132* 134*  K 3.8 4.7  CL 101 100  CO2 23 23  GLUCOSE 153* 227*  BUN 22 22  CREATININE 2.72* 2.62*  CALCIUM 9.0 8.8*   No results for input(s): LABPT, INR in the last 72 hours. No results for input(s): LABURIN in the last 72 hours. Results for orders placed or performed during the hospital encounter of 02/05/20  Resp Panel by RT-PCR (Flu A&B, Covid) Nasopharyngeal Swab     Status: None   Collection Time: 02/05/20 11:17 AM   Specimen: Nasopharyngeal Swab; Nasopharyngeal(NP) swabs in vial transport medium  Result Value Ref Range Status   SARS Coronavirus 2 by RT PCR NEGATIVE NEGATIVE Final    Comment: (NOTE) SARS-CoV-2 target nucleic acids are NOT DETECTED.  The SARS-CoV-2 RNA is generally detectable in upper respiratory specimens during the acute phase of infection. The lowest concentration of SARS-CoV-2 viral copies this assay can detect is 138 copies/mL. A negative result does not preclude SARS-Cov-2 infection and should not be used as the sole basis for  treatment or other patient management decisions. A negative result may occur with  improper specimen collection/handling, submission of specimen other than nasopharyngeal swab, presence of viral mutation(s) within the areas targeted by this assay, and inadequate number of viral copies(<138 copies/mL). A negative result must be combined with clinical observations, patient history, and epidemiological information. The expected result is Negative.  Fact Sheet for Patients:  BloggerCourse.comhttps://www.fda.gov/media/152166/download  Fact Sheet for Healthcare Providers:  SeriousBroker.ithttps://www.fda.gov/media/152162/download  This test is no t yet approved or cleared by the [place] FDA and  has been authorized for detection and/or diagnosis of SARS-CoV-2 by FDA under an Emergency Use Authorization (EUA). This EUA will remain  in effect (meaning this test can be used) for the duration of the COVID-19 declaration under Section 564(b)(1) of the Act, 21 U.S.C.section 360bbb-3(b)(1), unless the authorization is terminated  or revoked sooner.       Influenza A by PCR NEGATIVE NEGATIVE Final   Influenza B by PCR NEGATIVE NEGATIVE Final    Comment: (NOTE) The Xpert Xpress SARS-CoV-2/FLU/RSV plus assay is intended as an aid in the diagnosis of influenza from Nasopharyngeal swab specimens and should not be used as a sole basis for treatment. Nasal washings and aspirates are unacceptable for Xpert Xpress SARS-CoV-2/FLU/RSV testing.  Fact Sheet for Patients: BloggerCourse.comhttps://www.fda.gov/media/152166/download  Fact Sheet for Healthcare Providers: SeriousBroker.ithttps://www.fda.gov/media/152162/download  This test is not yet approved or cleared by the [place] FDA and has been authorized for detection and/or diagnosis of SARS-CoV-2 by FDA under  an Emergency Use Authorization (EUA). This EUA will remain in effect (meaning this test can be used) for the duration of the COVID-19 declaration under Section 564(b)(1) of the Act, 21  U.S.C. section 360bbb-3(b)(1), unless the authorization is terminated or revoked.  Performed at [facility], [address] [place], [place] [address]   Culture, blood (Routine X 2) w Reflex to ID Panel     Status: None (Preliminary result)   Collection Time: 02/05/20  1:28 PM   Specimen: BLOOD  Result Value Ref Range Status   Specimen Description BLOOD LEFT ANTECUBITAL  Final   Special Requests   Final    BOTTLES DRAWN AEROBIC AND ANAEROBIC Blood Culture adequate volume   Culture   Final    NO GROWTH < 24 HOURS Performed at [facility], [address] [place], [place] [address]    Report Status PENDING  Incomplete  Culture, blood (Routine X 2) w Reflex to ID Panel     Status: None (Preliminary result)   Collection Time: 02/05/20  1:30 PM   Specimen: BLOOD  Result Value Ref Range Status   Specimen Description BLOOD  Final   Special Requests NONE  Final   Culture   Final    NO GROWTH < 24 HOURS Performed at [facility], [address], [place], [place] [address]    Report Status PENDING  Incomplete    Studies/Results: CT Abdomen Pelvis Wo Contrast  Result Date: 02/05/2020 CLINICAL DATA:  LEFT-sided abdominal pain since Wednesday night. EXAM: CT ABDOMEN AND PELVIS WITHOUT CONTRAST TECHNIQUE: Multidetector CT imaging of the abdomen and pelvis was performed following the standard protocol without IV contrast. COMPARISON:  CT abdomen dated 10/19/2019 FINDINGS: Lower chest: Mild bibasilar atelectasis. Hepatobiliary: Liver is diffusely low in density indicating fatty infiltration. No focal liver abnormality is seen. Gallbladder is unremarkable. No bile duct dilatation. Pancreas: Unremarkable. No pancreatic ductal dilatation or surrounding inflammatory changes. Spleen: Normal in size without focal abnormality. Adrenals/Urinary Tract: Moderate LEFT-sided hydronephrosis. Proximal LEFT ureteral stone measures 4.5 x 4 mm. No additional ureteral stone. Bladder is unremarkable, partially  decompressed. 2 mm LEFT renal stone. 8 mm and 4 mm nonobstructing RIGHT renal stones. Stomach/Bowel: Surgical changes of a partial colectomy at the level of the sigmoid colon. No dilated large or small bowel loops. Mild diverticulosis of the LEFT colon without focal inflammation to suggest acute diverticulitis. No evidence of bowel wall inflammation. Stomach is unremarkable. Vascular/Lymphatic: Aortic atherosclerosis. No enlarged lymph nodes are seen. Reproductive: Prostate gland is not enlarged. Other: No free fluid or abscess collection. No free intraperitoneal air. Midline supraumbilical abdominal wall hernia which contains fat only. Musculoskeletal: No acute or suspicious osseous finding. Trabecular thickening of the LEFT-sided pubic rami and LEFT acetabulum suggesting some degree of Paget's disease. Mild degenerative spondylosis of the thoracolumbar spine. IMPRESSION: 1. Proximal LEFT ureteral stone measures 4.5 x 4 mm, causing moderate LEFT-sided hydronephrosis and perinephric edema. 2. Bilateral nephrolithiasis. 3. Fatty infiltration of the liver. 4. Colonic diverticulosis without evidence of acute diverticulitis. 5. Midline supraumbilical abdominal wall hernia which contains fat only. 6. 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 Aortic Atherosclerosis (ICD10-I70.0). Electronically Signed   By: Franki Cabot M.D.   On: 02/05/2020 11:40   DG C-Arm 1-60 Min-No Report  Result Date: 02/05/2020 Fluoroscopy was utilized by the requesting physician.  No radiographic interpretation.    Assessment/Plan S/p bilateral stents for AKI with left ureteral stone/renal function slightly improved. Cont to monitor renal function, will need definitive ureteroscopy with  laser lithotripsy once renal function improved     LOS: 1 day   Belva Agee 02/06/2020, 8:12 AM

## 2020-02-07 ENCOUNTER — Encounter (HOSPITAL_COMMUNITY): Payer: Self-pay | Admitting: Urology

## 2020-02-07 DIAGNOSIS — N189 Chronic kidney disease, unspecified: Secondary | ICD-10-CM | POA: Diagnosis not present

## 2020-02-07 DIAGNOSIS — N179 Acute kidney failure, unspecified: Secondary | ICD-10-CM | POA: Diagnosis not present

## 2020-02-07 LAB — RENAL FUNCTION PANEL
Albumin: 3.4 g/dL — ABNORMAL LOW (ref 3.5–5.0)
Anion gap: 8 (ref 5–15)
BUN: 24 mg/dL — ABNORMAL HIGH (ref 8–23)
CO2: 25 mmol/L (ref 22–32)
Calcium: 8.7 mg/dL — ABNORMAL LOW (ref 8.9–10.3)
Chloride: 105 mmol/L (ref 98–111)
Creatinine, Ser: 2 mg/dL — ABNORMAL HIGH (ref 0.61–1.24)
GFR, Estimated: 35 mL/min — ABNORMAL LOW (ref >60)
Glucose, Bld: 128 mg/dL — ABNORMAL HIGH (ref 70–99)
Phosphorus: 2.6 mg/dL (ref 2.5–4.6)
Potassium: 4.3 mmol/L (ref 3.5–5.1)
Sodium: 138 mmol/L (ref 135–145)

## 2020-02-07 LAB — URINE CULTURE
Culture: NO GROWTH
Culture: <10000 — AB

## 2020-02-07 LAB — CBC WITH DIFFERENTIAL/PLATELET
Abs Immature Granulocytes: 0.04 10*3/uL (ref 0.00–0.07)
Basophils Absolute: 0.1 10*3/uL (ref 0.0–0.1)
Basophils Relative: 1 %
Eosinophils Absolute: 0.1 10*3/uL (ref 0.0–0.5)
Eosinophils Relative: 1 %
HCT: 36.3 % — ABNORMAL LOW (ref 39.0–52.0)
Hemoglobin: 12.1 g/dL — ABNORMAL LOW (ref 13.0–17.0)
Immature Granulocytes: 0 %
Lymphocytes Relative: 26 %
Lymphs Abs: 2.7 10*3/uL (ref 0.7–4.0)
MCH: 30.9 pg (ref 26.0–34.0)
MCHC: 33.3 g/dL (ref 30.0–36.0)
MCV: 92.6 fL (ref 80.0–100.0)
Monocytes Absolute: 0.9 10*3/uL (ref 0.1–1.0)
Monocytes Relative: 8 %
Neutro Abs: 6.8 10*3/uL (ref 1.7–7.7)
Neutrophils Relative %: 64 %
Platelets: 271 10*3/uL (ref 150–400)
RBC: 3.92 MIL/uL — ABNORMAL LOW (ref 4.22–5.81)
RDW: 11.8 % (ref 11.5–15.5)
WBC: 10.6 10*3/uL — ABNORMAL HIGH (ref 4.0–10.5)
nRBC: 0 % (ref 0.0–0.2)

## 2020-02-07 LAB — GLUCOSE, CAPILLARY
Glucose-Capillary: 117 mg/dL — ABNORMAL HIGH (ref 70–99)
Glucose-Capillary: 166 mg/dL — ABNORMAL HIGH (ref 70–99)

## 2020-02-07 LAB — MAGNESIUM: Magnesium: 2 mg/dL (ref 1.7–2.4)

## 2020-02-07 NOTE — Discharge Summary (Signed)
Physician Discharge Summary  Shawn Hernandez BJY:[id number] DOB: August 17, 1950 DOA: 02/05/2020  PCP: Shawn Seashore, MD  Admit date: 02/05/2020 Discharge date: 02/07/2020  Admitted From: Home  Disposition:  Home   Recommendations for Outpatient Follow-up:  1. Follow up with PCP Dr. Ashby Hernandez in 3 days 2. Dr. Ashby Hernandez: Please obtain BMP on Thursday and restart olmesartan when appropriate 3. Dr. Ashby Hernandez: Please restart apixaban this week if hematuria clears and no lithotripsy is imminent, otherwise hold until lithotripsy      Home Health: None  Equipment/Devices: None  Discharge Condition: Good  CODE STATUS: FULL Diet recommendation: Cardaic  Brief/Interim Summary: Shawn Hernandez is a 70 y.o. M with DM, HTN, PAF on apixaban and dronedarone, CKD IIIa baseline Cr 1.3-1.5 who presented with LLQ pain.  In the ER, found to have low grade temp, CT abdomen showed hydronephrosis and impacted left renal stone.  Creatinine 2.7.          PRINCIPAL HOSPITAL DIAGNOSIS: Acute kidney injury due to nephrolithiasis with hydronephrosis        Discharge Diagnoses:   Acute kidney injury on CKD stage IIIa Left renal stone with hydronephrosis Bilateral renal stones S/p cystoscopy and bilateral ureteral stent placement on 12/31 by Shawn Hernandez Creatinine 2.7 on admission.  Admitted, started on fluids and IV antibiotic, and taken to the OR by Dr. Bess Hernandez for bilateral stent placement.  Postoperatively, still with some hematuria, but renal function improving to 2.  Urine culture no growth.  Patient will follow up with urology within 2 weeks for lithotripsy.     Paroxysmal atrial fibrillation Patient's gross hematuria persists, so I will defer resuming apixaban for now.  If urine clears quickly, defer resumption of apixaban to PCP later this week.      Diabetes, well controlled, with chronic kidney disease Continue excellent home regimen.  Coronary artery disease Hold  losartan given AKI.             Discharge Instructions  Discharge Instructions    Diet - low sodium heart healthy   Complete by: As directed    Discharge instructions   Complete by: As directed    From Shawn Hernandez: You were admitted for a kidney stone and kidney injury  You had a stent placed around the stone, and you should follow up with Shawn Hernandez at [facility] (contact info below) as soon as able within 2 weeks Call his office tomorrow to let them know Shawn Hernandez placed a stent this weekend and ask when you should be seen   For now: Because your urine is still quite bloody --> do not restart your Eliquis Because your kidney function is not all the way back to nromal --> do not take your olmesartan/Benicar  Go see Dr. Ashby Hernandez THIS WEEK (call his office tomorrow to schedule) and ask them to check your blood level (hemoglobin) and your kidney function (creatinine)  Ask Dr. Ashby Hernandez if you should restart your Eliquis  Also ask him if you should restart Benicar    For pain for the kidney stone: take acetaminophen 500 or 1000 mg (1 or 2 tabs) up to three times per day Avoid NSAIDs as you do    For constipation: Take MiraLAX once or twice daily   Lastly, PUSH FLUIDS Drink plenty of fluids Drink at least 80 oz of fluids for the next few days, then back down to at least 64 oz of fluid per day  (A good way to do this is find a 16  oz bottle and keep a log of drinking AT LEAST four of those bottles per day) Your urine should be light yellow!   Increase activity slowly   Complete by: As directed      Allergies as of 02/07/2020      Reactions   Ivp Dye [iodinated Diagnostic Agents] Hives   Lisinopril Other (See Comments)   cough   Statins Other (See Comments)   myalgias   Iodine Rash      Medication List    STOP taking these medications   apixaban 5 MG Tabs tablet Commonly known as: ELIQUIS   olmesartan 5 MG tablet Commonly known as: BENICAR    traMADol 50 MG tablet Commonly known as: Ultram     TAKE these medications   blood glucose meter kit and supplies Dispense based on patient and insurance preference. Use up to four times daily as directed. (FOR ICD-10 E11.10).   ezetimibe 10 MG tablet Commonly known as: ZETIA TAKE 1 TABLET BY MOUTH EVERY DAY   fenofibrate 145 MG tablet Commonly known as: TRICOR Take 1 tablet (145 mg total) by mouth daily.   glimepiride 4 MG tablet Commonly known as: AMARYL Take 4 mg by mouth daily with breakfast.   Magnesium 400 MG Tabs Take 1 tablet by mouth daily.   metFORMIN 500 MG 24 hr tablet Commonly known as: GLUCOPHAGE-XR Take 500 mg by mouth daily with breakfast.   Multaq 400 MG tablet Generic drug: dronedarone Take 1 tablet (400 mg total) by mouth 2 (two) times daily with a meal.   rosuvastatin 5 MG tablet Commonly known as: CRESTOR Take 1 tablet (5 mg total) by mouth daily.   Trulicity 0.03 LD/4.4CQ Sopn Generic drug: Dulaglutide Inject 0.75 mg into the skin every Friday.       Follow-up Information    Shawn Seashore, MD. Schedule an appointment as soon as possible for a visit in 4 day(s).   Specialty: Internal Medicine Why: and get labs done Contact information: [address] [place] [address] [phone]        Shawn Frock, MD. Schedule an appointment as soon as possible for a visit in 2 week(s).   Specialty: Urology Contact information: [address] [place] [address] [phone]              Allergies  Allergen Reactions  . Ivp Dye [Iodinated Diagnostic Agents] Hives  . Lisinopril Other (See Comments)    cough  . Statins Other (See Comments)    myalgias  . Iodine Rash    Consultations:  Urology   Procedures/Studies: CT Abdomen Pelvis Wo Contrast  Result Date: 02/05/2020 CLINICAL DATA:  LEFT-sided abdominal pain since Wednesday night. EXAM: CT ABDOMEN AND PELVIS WITHOUT CONTRAST TECHNIQUE:  Multidetector CT imaging of the abdomen and pelvis was performed following the standard protocol without IV contrast. COMPARISON:  CT abdomen dated 10/19/2019 FINDINGS: Lower chest: Mild bibasilar atelectasis. Hepatobiliary: Liver is diffusely low in density indicating fatty infiltration. No focal liver abnormality is seen. Gallbladder is unremarkable. No bile duct dilatation. Pancreas: Unremarkable. No pancreatic ductal dilatation or surrounding inflammatory changes. Spleen: Normal in size without focal abnormality. Adrenals/Urinary Tract: Moderate LEFT-sided hydronephrosis. Proximal LEFT ureteral stone measures 4.5 x 4 mm. No additional ureteral stone. Bladder is unremarkable, partially decompressed. 2 mm LEFT renal stone. 8 mm and 4 mm nonobstructing RIGHT renal stones. Stomach/Bowel: Surgical changes of a partial colectomy at the level of the sigmoid colon. No dilated large or small bowel loops.  Mild diverticulosis of the LEFT colon without focal inflammation to suggest acute diverticulitis. No evidence of bowel wall inflammation. Stomach is unremarkable. Vascular/Lymphatic: Aortic atherosclerosis. No enlarged lymph nodes are seen. Reproductive: Prostate gland is not enlarged. Other: No free fluid or abscess collection. No free intraperitoneal air. Midline supraumbilical abdominal wall hernia which contains fat only. Musculoskeletal: No acute or suspicious osseous finding. Trabecular thickening of the LEFT-sided pubic rami and LEFT acetabulum suggesting some degree of Paget's disease. Mild degenerative spondylosis of the thoracolumbar spine. IMPRESSION: 1. Proximal LEFT ureteral stone measures 4.5 x 4 mm, causing moderate LEFT-sided hydronephrosis and perinephric edema. 2. Bilateral nephrolithiasis. 3. Fatty infiltration of the liver. 4. Colonic diverticulosis without evidence of acute diverticulitis. 5. Midline supraumbilical abdominal wall hernia which contains fat only. 6. 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 Aortic Atherosclerosis (ICD10-I70.0). Electronically Signed   By: Franki Cabot M.D.   On: 02/05/2020 11:40   DG C-Arm 1-60 Min-No Report  Result Date: 02/05/2020 Fluoroscopy was utilized by the requesting physician.  No radiographic interpretation.       Subjective: Feeling well.  Appetite good.  Constipation resolved.  Hematuria persists.  No fever, no confusion, no vomiting.     Discharge Exam: Vitals:   02/07/20 0558 02/07/20 1333  BP: 129/72 128/78  Pulse: 64 68  Resp: 17 14  Temp: 98 F (36.7 C) 98.3 F (36.8 C)  SpO2: 95% 97%   Vitals:   02/06/20 1312 02/06/20 2157 02/07/20 0558 02/07/20 1333  BP: 134/69 133/77 129/72 128/78  Pulse: 69 64 64 68  Resp: _0 Temp: 97.9 F (36.6 C) 97.7 F (36.5 C) 98 F (36.7 C) 98.3 F (36.8 C)  TempSrc: Oral   Oral  SpO2: 97% 95% 95% 97%  Weight:      Height:        General: Pt is alert, awake, not in acute distress Cardiovascular: RRR, nl S1-S2, no murmurs appreciated.   No LE edema.   Respiratory: Normal respiratory rate and rhythm.  CTAB without rales or wheezes. Abdominal: Abdomen soft and non-tender.  No distension or HSM.   Neuro/Psych: Strength symmetric in upper and lower extremities.  Judgment and insight appear normal.   The results of significant diagnostics from this hospitalization (including imaging, microbiology, ancillary and laboratory) are listed below for reference.     Microbiology: Recent Results (from the past 240 hour(s))  Resp Panel by RT-PCR (Flu A&B, Covid) Nasopharyngeal Swab     Status: None   Collection Time: 02/05/20 11:17 AM   Specimen: Nasopharyngeal Swab; Nasopharyngeal(NP) swabs in vial transport medium  Result Value Ref Range Status   SARS Coronavirus 2 by RT PCR NEGATIVE NEGATIVE Final    Comment: (NOTE) SARS-CoV-2 target nucleic acids are NOT DETECTED.  The SARS-CoV-2 RNA is generally detectable in upper respiratory specimens during the acute phase of  infection. The lowest concentration of SARS-CoV-2 viral copies this assay can detect is 138 copies/mL. A negative result does not preclude SARS-Cov-2 infection and should not be used as the sole basis for treatment or other patient management decisions. A negative result may occur with  improper specimen collection/handling, submission of specimen other than nasopharyngeal swab, presence of viral mutation(s) within the areas targeted by this assay, and inadequate number of viral copies(<138 copies/mL). A negative result must be combined with clinical observations, patient history, and epidemiological information. The expected result is Negative.  Fact Sheet for Patients:  EntrepreneurPulse.com.au  Fact Sheet for Healthcare Providers:  IncredibleEmployment.be  This test is no t yet approved or cleared by the [place] and  has been authorized for detection and/or diagnosis of SARS-CoV-2 by FDA under an Emergency Use Authorization (EUA). This EUA will remain  in effect (meaning this test can be used) for the duration of the COVID-19 declaration under Section 564(b)(1) of the Act, 21 U.S.C.section 360bbb-3(b)(1), unless the authorization is terminated  or revoked sooner.       Influenza A by PCR NEGATIVE NEGATIVE Final   Influenza B by PCR NEGATIVE NEGATIVE Final    Comment: (NOTE) The Xpert Xpress SARS-CoV-2/FLU/RSV plus assay is intended as an aid in the diagnosis of influenza from Nasopharyngeal swab specimens and should not be used as a sole basis for treatment. Nasal washings and aspirates are unacceptable for Xpert Xpress SARS-CoV-2/FLU/RSV testing.  Fact Sheet for Patients: EntrepreneurPulse.com.au  Fact Sheet for Healthcare Providers: IncredibleEmployment.be  This test is not yet approved or cleared by the [place] FDA and has been authorized for detection and/or diagnosis of SARS-CoV-2  by FDA under an Emergency Use Authorization (EUA). This EUA will remain in effect (meaning this test can be used) for the duration of the COVID-19 declaration under Section 564(b)(1) of the Act, 21 U.S.C. section 360bbb-3(b)(1), unless the authorization is terminated or revoked.  Performed at [facility], [address] [place], [place] [address]   Culture, blood (Routine X 2) w Reflex to ID Panel     Status: None (Preliminary result)   Collection Time: 02/05/20  1:28 PM   Specimen: BLOOD  Result Value Ref Range Status   Specimen Description BLOOD LEFT ANTECUBITAL  Final   Special Requests   Final    BOTTLES DRAWN AEROBIC AND ANAEROBIC Blood Culture adequate volume   Culture   Final    NO GROWTH 2 DAYS Performed at [facility], [address] [place], [place] [address]    Report Status PENDING  Incomplete  Culture, blood (Routine X 2) w Reflex to ID Panel     Status: None (Preliminary result)   Collection Time: 02/05/20  1:30 PM   Specimen: BLOOD  Result Value Ref Range Status   Specimen Description BLOOD  Final   Special Requests NONE  Final   Culture   Final    NO GROWTH 2 DAYS Performed at [facility], [address], [place], [place] [address]    Report Status PENDING  Incomplete  Culture, Urine     Status: Abnormal   Collection Time: 02/05/20  2:55 PM   Specimen: Urine, Clean Catch  Result Value Ref Range Status   Specimen Description   Final    URINE, CLEAN CATCH Performed at [facility], [place] [address], [place], [place] [address]    Special Requests   Final    NONE Performed at [facility], [place] [address] [place], [place] [address]    Culture (A)  Final    <10,000 COLONIES/mL INSIGNIFICANT GROWTH Performed at [place] [address] [place], [place] [address]    Report Status 02/07/2020 FINAL  Final     Labs: BNP (last 3 results) No results for input(s): BNP in the last 8760 hours. Basic Metabolic  Panel: Recent Labs  Lab 02/05/20 0905 02/06/20 0345 02/07/20 0511  NA 132* 134* 138  K 3.8 4.7 4.3  CL 101 100 105  CO2 _0 GLUCOSE 153* 227* 128*  BUN 22 22 24*  CREATININE 2.72* 2.62*  2.00*  CALCIUM 9.0 8.8* 8.7*  MG  --   --  2.0  PHOS  --   --  2.6   Liver Function Tests: Recent Labs  Lab 02/05/20 0905 02/07/20 0511  AST 19  --   ALT 21  --   ALKPHOS 39  --   BILITOT 1.0  --   PROT 7.3  --   ALBUMIN 4.3 3.4*   Recent Labs  Lab 02/05/20 0905  LIPASE 36   No results for input(s): AMMONIA in the last 168 hours. CBC: Recent Labs  Lab 02/05/20 0905 02/06/20 0345 02/07/20 0511  WBC 12.0* 11.1* 10.6*  NEUTROABS 8.5*  --  6.8  HGB 14.3 13.1 12.1*  HCT 42.7 39.2 36.3*  MCV 93.2 93.8 92.6  PLT 303 274 271   Cardiac Enzymes: No results for input(s): CKTOTAL, CKMB, CKMBINDEX, TROPONINI in the last 168 hours. BNP: Invalid input(s): POCBNP CBG: Recent Labs  Lab 02/05/20 2233 02/06/20 0731 02/06/20 2159 02/07/20 0752 02/07/20 1131  GLUCAP 123* 248* 147* 117* 166*   D-Dimer No results for input(s): DDIMER in the last 72 hours. Hgb A1c Recent Labs    02/06/20 0345  HGBA1C 6.5*   Lipid Profile No results for input(s): CHOL, HDL, LDLCALC, TRIG, CHOLHDL, LDLDIRECT in the last 72 hours. Thyroid function studies No results for input(s): TSH, T4TOTAL, T3FREE, THYROIDAB in the last 72 hours.  Invalid input(s): FREET3 Anemia work up No results for input(s): VITAMINB12, FOLATE, FERRITIN, TIBC, IRON, RETICCTPCT in the last 72 hours. Urinalysis    Component Value Date/Time   COLORURINE YELLOW 02/05/2020 [place] 02/05/2020 0835   LABSPEC 1.012 02/05/2020 0835   PHURINE 6.0 02/05/2020 0835   GLUCOSEU NEGATIVE 02/05/2020 0835   HGBUR LARGE (A) 02/05/2020 0835   BILIRUBINUR NEGATIVE 02/05/2020 0835   KETONESUR NEGATIVE 02/05/2020 0835   PROTEINUR NEGATIVE 02/05/2020 0835   NITRITE NEGATIVE 02/05/2020 0835   LEUKOCYTESUR NEGATIVE  02/05/2020 0835   Sepsis Labs Invalid input(s): PROCALCITONIN,  WBC,  LACTICIDVEN Microbiology Recent Results (from the past 240 hour(s))  Resp Panel by RT-PCR (Flu A&B, Covid) Nasopharyngeal Swab     Status: None   Collection Time: 02/05/20 11:17 AM   Specimen: Nasopharyngeal Swab; Nasopharyngeal(NP) swabs in vial transport medium  Result Value Ref Range Status   SARS Coronavirus 2 by RT PCR NEGATIVE NEGATIVE Final    Comment: (NOTE) SARS-CoV-2 target nucleic acids are NOT DETECTED.  The SARS-CoV-2 RNA is generally detectable in upper respiratory specimens during the acute phase of infection. The lowest concentration of SARS-CoV-2 viral copies this assay can detect is 138 copies/mL. A negative result does not preclude SARS-Cov-2 infection and should not be used as the sole basis for treatment or other patient management decisions. A negative result may occur with  improper specimen collection/handling, submission of specimen other than nasopharyngeal swab, presence of viral mutation(s) within the areas targeted by this assay, and inadequate number of viral copies(<138 copies/mL). A negative result must be combined with clinical observations, patient history, and epidemiological information. The expected result is Negative.  Fact Sheet for Patients:  EntrepreneurPulse.com.au  Fact Sheet for Healthcare Providers:  IncredibleEmployment.be  This test is no t yet approved or cleared by the [place] FDA and  has been authorized for detection and/or diagnosis of SARS-CoV-2 by FDA under an Emergency Use Authorization (EUA). This EUA will remain  in effect (meaning this test can be used) for the duration of the COVID-19 declaration under Section 564(b)(1)  of the Act, 21 U.S.C.section 360bbb-3(b)(1), unless the authorization is terminated  or revoked sooner.       Influenza A by PCR NEGATIVE NEGATIVE Final   Influenza B by PCR NEGATIVE  NEGATIVE Final    Comment: (NOTE) The Xpert Xpress SARS-CoV-2/FLU/RSV plus assay is intended as an aid in the diagnosis of influenza from Nasopharyngeal swab specimens and should not be used as a sole basis for treatment. Nasal washings and aspirates are unacceptable for Xpert Xpress SARS-CoV-2/FLU/RSV testing.  Fact Sheet for Patients: EntrepreneurPulse.com.au  Fact Sheet for Healthcare Providers: IncredibleEmployment.be  This test is not yet approved or cleared by the [place] FDA and has been authorized for detection and/or diagnosis of SARS-CoV-2 by FDA under an Emergency Use Authorization (EUA). This EUA will remain in effect (meaning this test can be used) for the duration of the COVID-19 declaration under Section 564(b)(1) of the Act, 21 U.S.C. section 360bbb-3(b)(1), unless the authorization is terminated or revoked.  Performed at [facility], [address] [place], [place] [address]   Culture, blood (Routine X 2) w Reflex to ID Panel     Status: None (Preliminary result)   Collection Time: 02/05/20  1:28 PM   Specimen: BLOOD  Result Value Ref Range Status   Specimen Description BLOOD LEFT ANTECUBITAL  Final   Special Requests   Final    BOTTLES DRAWN AEROBIC AND ANAEROBIC Blood Culture adequate volume   Culture   Final    NO GROWTH 2 DAYS Performed at [facility], [address], [place], [place] [address]    Report Status PENDING  Incomplete  Culture, blood (Routine X 2) w Reflex to ID Panel     Status: None (Preliminary result)   Collection Time: 02/05/20  1:30 PM   Specimen: BLOOD  Result Value Ref Range Status   Specimen Description BLOOD  Final   Special Requests NONE  Final   Culture   Final    NO GROWTH 2 DAYS Performed at [facility], [address], [place], [place] [address]    Report Status PENDING  Incomplete  Culture, Urine     Status: Abnormal   Collection Time: 02/05/20  2:55 PM   Specimen: Urine, Clean  Catch  Result Value Ref Range Status   Specimen Description   Final    URINE, CLEAN CATCH Performed at [facility], [place] [address], [place], [place] [address]    Special Requests   Final    NONE Performed at [facility], [place] [address], [place], [place] [address]    Culture (A)  Final    <10,000 COLONIES/mL INSIGNIFICANT GROWTH Performed at [place] [address], [place], [place] [address]    Report Status 02/07/2020 FINAL  Final     Time coordinating discharge: 45 minutes The [place] controlled substances registry was reviewed for this patient      SIGNED:   Edwin Dada, MD  [facility] 02/07/2020, 2:39 PM

## 2020-02-07 NOTE — Progress Notes (Signed)
2 Days Post-Op Subjective: Patient reports feeling much better.  Had bowel movement yesterday and abdomen softer continues to pass flatus.  Minimal discomfort from the stents voiding satisfactorily.  Creatinine improved now down to 2.0.  Cultures negative so far..  Objective: Vital signs in last 24 hours: Temp:  [97.6 F (36.4 C)-98 F (36.7 C)] 98 F (36.7 C) (01/02 0558) Pulse Rate:  [64-79] 64 (01/02 0558) Resp:  [16-18] 17 (01/02 0558) BP: (129-174)/(69-85) 129/72 (01/02 0558) SpO2:  [95 %-97 %] 95 % (01/02 0558)  Intake/Output from previous day: 01/01 0701 - 01/02 0700 In: 2277.6 [P.O.:240; I.V.:1937.6; IV Piggyback:100] Out: 3125 [Urine:3125] Intake/Output this shift: No intake/output data recorded.  Physical Exam:  General:alert and cooperative     Lab Results: Recent Labs    02/05/20 0905 02/06/20 0345 02/07/20 0511  HGB 14.3 13.1 12.1*  HCT 42.7 39.2 36.3*   BMET Recent Labs    02/06/20 0345 02/07/20 0511  NA 134* 138  K 4.7 4.3  CL 100 105  CO2 23 25  GLUCOSE 227* 128*  BUN 22 24*  CREATININE 2.62* 2.00*  CALCIUM 8.8* 8.7*   No results for input(s): LABPT, INR in the last 72 hours. No results for input(s): LABURIN in the last 72 hours. Results for orders placed or performed during the hospital encounter of 02/05/20  Resp Panel by RT-PCR (Flu A&B, Covid) Nasopharyngeal Swab     Status: None   Collection Time: 02/05/20 11:17 AM   Specimen: Nasopharyngeal Swab; Nasopharyngeal(NP) swabs in vial transport medium  Result Value Ref Range Status   SARS Coronavirus 2 by RT PCR NEGATIVE NEGATIVE Final    Comment: (NOTE) SARS-CoV-2 target nucleic acids are NOT DETECTED.  The SARS-CoV-2 RNA is generally detectable in upper respiratory specimens during the acute phase of infection. The lowest concentration of SARS-CoV-2 viral copies this assay can detect is 138 copies/mL. A negative result does not preclude SARS-Cov-2 infection and should not be used as  the sole basis for treatment or other patient management decisions. A negative result may occur with  improper specimen collection/handling, submission of specimen other than nasopharyngeal swab, presence of viral mutation(s) within the areas targeted by this assay, and inadequate number of viral copies(<138 copies/mL). A negative result must be combined with clinical observations, patient history, and epidemiological information. The expected result is Negative.  Fact Sheet for Patients:  BloggerCourse.com  Fact Sheet for Healthcare Providers:  SeriousBroker.it  This test is no t yet approved or cleared by the [place] FDA and  has been authorized for detection and/or diagnosis of SARS-CoV-2 by FDA under an Emergency Use Authorization (EUA). This EUA will remain  in effect (meaning this test can be used) for the duration of the COVID-19 declaration under Section 564(b)(1) of the Act, 21 U.S.C.section 360bbb-3(b)(1), unless the authorization is terminated  or revoked sooner.       Influenza A by PCR NEGATIVE NEGATIVE Final   Influenza B by PCR NEGATIVE NEGATIVE Final    Comment: (NOTE) The Xpert Xpress SARS-CoV-2/FLU/RSV plus assay is intended as an aid in the diagnosis of influenza from Nasopharyngeal swab specimens and should not be used as a sole basis for treatment. Nasal washings and aspirates are unacceptable for Xpert Xpress SARS-CoV-2/FLU/RSV testing.  Fact Sheet for Patients: BloggerCourse.com  Fact Sheet for Healthcare Providers: SeriousBroker.it  This test is not yet approved or cleared by the [place] FDA and has been authorized for detection and/or diagnosis of SARS-CoV-2 by FDA under an  Emergency Use Authorization (EUA). This EUA will remain in effect (meaning this test can be used) for the duration of the COVID-19 declaration under Section 564(b)(1) of  the Act, 21 U.S.C. section 360bbb-3(b)(1), unless the authorization is terminated or revoked.  Performed at [facility], [address] [place], [place] [address]   Culture, blood (Routine X 2) w Reflex to ID Panel     Status: None (Preliminary result)   Collection Time: 02/05/20  1:28 PM   Specimen: BLOOD  Result Value Ref Range Status   Specimen Description BLOOD LEFT ANTECUBITAL  Final   Special Requests   Final    BOTTLES DRAWN AEROBIC AND ANAEROBIC Blood Culture adequate volume   Culture   Final    NO GROWTH 2 DAYS Performed at [facility], [address] [place], [place] [address]    Report Status PENDING  Incomplete  Culture, blood (Routine X 2) w Reflex to ID Panel     Status: None (Preliminary result)   Collection Time: 02/05/20  1:30 PM   Specimen: BLOOD  Result Value Ref Range Status   Specimen Description BLOOD  Final   Special Requests NONE  Final   Culture   Final    NO GROWTH 2 DAYS Performed at [facility], [address], [place], [place] [address]    Report Status PENDING  Incomplete  Culture, Urine     Status: Abnormal   Collection Time: 02/05/20  2:55 PM   Specimen: Urine, Clean Catch  Result Value Ref Range Status   Specimen Description   Final    URINE, CLEAN CATCH Performed at [facility], [place] [address], [place], [place] [address]    Special Requests   Final    NONE Performed at [facility], [place] [address], [place], [place] [address]    Culture (A)  Final    <10,000 COLONIES/mL INSIGNIFICANT GROWTH Performed at [facility] [address] [place], [place] [address]    Report Status 02/07/2020 FINAL  Final    Studies/Results: CT Abdomen Pelvis Wo Contrast  Result Date: 02/05/2020 CLINICAL DATA:  LEFT-sided abdominal pain since Wednesday night. EXAM: CT ABDOMEN AND PELVIS WITHOUT CONTRAST TECHNIQUE: Multidetector CT imaging of the abdomen and pelvis was performed following the standard protocol  without IV contrast. COMPARISON:  CT abdomen dated 10/19/2019 FINDINGS: Lower chest: Mild bibasilar atelectasis. Hepatobiliary: Liver is diffusely low in density indicating fatty infiltration. No focal liver abnormality is seen. Gallbladder is unremarkable. No bile duct dilatation. Pancreas: Unremarkable. No pancreatic ductal dilatation or surrounding inflammatory changes. Spleen: Normal in size without focal abnormality. Adrenals/Urinary Tract: Moderate LEFT-sided hydronephrosis. Proximal LEFT ureteral stone measures 4.5 x 4 mm. No additional ureteral stone. Bladder is unremarkable, partially decompressed. 2 mm LEFT renal stone. 8 mm and 4 mm nonobstructing RIGHT renal stones. Stomach/Bowel: Surgical changes of a partial colectomy at the level of the sigmoid colon. No dilated large or small bowel loops. Mild diverticulosis of the LEFT colon without focal inflammation to suggest acute diverticulitis. No evidence of bowel wall inflammation. Stomach is unremarkable. Vascular/Lymphatic: Aortic atherosclerosis. No enlarged lymph nodes are seen. Reproductive: Prostate gland is not enlarged. Other: No free fluid or abscess collection. No free intraperitoneal air. Midline supraumbilical abdominal wall hernia which contains fat only. Musculoskeletal: No acute or suspicious osseous finding. Trabecular thickening of the LEFT-sided pubic rami and LEFT acetabulum suggesting some degree of Paget's disease. Mild degenerative spondylosis of the thoracolumbar spine. IMPRESSION: 1. Proximal LEFT ureteral  stone measures 4.5 x 4 mm, causing moderate LEFT-sided hydronephrosis and perinephric edema. 2. Bilateral nephrolithiasis. 3. Fatty infiltration of the liver. 4. Colonic diverticulosis without evidence of acute diverticulitis. 5. Midline supraumbilical abdominal wall hernia which contains fat only. 6. 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 Aortic Atherosclerosis (ICD10-I70.0). Electronically Signed   By: Franki Cabot M.D.    On: 02/05/2020 11:40   DG C-Arm 1-60 Min-No Report  Result Date: 02/05/2020 Fluoroscopy was utilized by the requesting physician.  No radiographic interpretation.    Assessment/Plan: S/p bilateral stents for AKI with left ureteral stone/renal function slightly improved.CR now 2.0 Cont to monitor renal function, will need definitive ureteroscopy with laser lithotripsy once renal function improved.  As patient clinically improved and creatinine moving in the right direction feel he could safely be discharged home with outpatient follow-up with Dr. Tresa Moore for definitive management of his stones. Follow up urine/blood cultures    LOS: 2 days   Remi Haggard 02/07/2020, 7:55 AM

## 2020-02-08 ENCOUNTER — Other Ambulatory Visit: Payer: Self-pay

## 2020-02-08 MED ORDER — MULTAQ 400 MG PO TABS: 400.0000 mg | ORAL_TABLET | Freq: Two times a day (BID) | ORAL | 3 refills | Status: DC

## 2020-02-08 NOTE — Op Note (Signed)
NAME: Shawn Hernandez, Shawn Hernandez MEDICAL RECORD DJ:[id number] ACCOUNT [id number] DATE OF BIRTH:12/05/1950 FACILITY: [facility] LOCATION: [facility] PHYSICIAN:Jac Romulus, MD  OPERATIVE REPORT  DATE OF PROCEDURE:  02/05/2020  PREOPERATIVE DIAGNOSES:  Left ureteral and bilateral renal stones, acute renal failure.  PROCEDURE: 1.  Cystoscopy with bilateral retrograde pyelograms, interpretation. 2.  Insertion of bilateral ureteral stents, 5 x 26 Polaris, no tether.  ESTIMATED BLOOD LOSS:  Nil.  COMPLICATIONS:  None.  SPECIMENS:  None.  FINDINGS: 1.  Unremarkable urinary bladder. 2.  Mild left hydronephrosis from left ureteral stone. 3.  Successful placement of bilateral ureteral stents, proximal end in renal pelvis, distal end in urinary bladder.  INDICATIONS:  The patient is a 70 year old man with history of mild renal insufficiency.  He was found on workup for colicky flank pain and nausea to have a left proximal ureteral stone as well as acute on chronic renal failure.  Despite the small size  of the stone, unilateral obstruction, his renal compromise is quite concerning.  He does have a history of diabetes.  Options were discussed for management including recommended path of renal decompression today with stenting and then definitive  management with bilateral ureteroscopy with goal of stone free in the elective setting and he wished to proceed with stenting tonight.  Informed consent was obtained and placed in medical record.  DESCRIPTION OF PROCEDURE:  The patient being himself, verified the procedure being cystoscopy, bilateral stent placement was confirmed.  Procedure timeout was performed.  Intravenous antibiotics administered.  General anesthesia induced.  The patient was  placed into a low lithotomy position.  Sterile field was created, prepped and draped base of the penis, perineum and proximal thighs using iodine.  Cystourethroscopy was performed using 21-French rigid cystoscope with offset  lens.  Inspection of  anterior and posterior urethra was unremarkable.  Inspection unilateral no diverticula, calcifications, papillary lesions.  The right ureteral orifice was cannulated.  A 6-French renal catheter and right retrograde pyelogram was obtained.  Right retrograde pyelogram demonstrated a single right ureter, single system right kidney.  No filling defects or narrowing noted.  A 0.03 ZIPwire was advanced to lower pole over which a new 5 x 26 Polaris-type stent was placed using fluoroscopic  guidance.  Good proximal and distal planes were noted.  Next, again using the cystoscope, the left ureteral orifice was cannulated with 6-French renal catheter and left retrograde pyelogram was obtained.  Left retrograde pyelogram demonstrated a single left ureter, single system left kidney.  There was mild hydronephrosis to what appeared to be mobile filling defect in proximal ureter consistent with a known stone.  A 0.03 ZIPwire was advanced to the  level of the upper pole and a separate 5 x 26 Polaris-type stent was placed using fluoroscopic guidance.  Good proximal and distal planes were noted.  Bladder was entered per cystoscope.  Procedure was then terminated.  The patient tolerated the  procedure well.  No immediate apparent complications.  The patient was taken to the postanesthesia care unit in stable condition.  Notably, the patient does have a low-grade fever noted immediately preop and postop consistent with possible early  pyelonephritis.  We will keep him on intravenous antibiotics for now and this even more so further as the indication of stenting.  IN/NUANCE  D:02/05/2020 T:02/06/2020 JOB:[id number]

## 2020-02-10 LAB — GLUCOSE, CAPILLARY
Glucose-Capillary: 97 mg/dL (ref 70–99)
Glucose-Capillary: 176 mg/dL — ABNORMAL HIGH (ref 70–99)
Glucose-Capillary: 137 mg/dL — ABNORMAL HIGH (ref 70–99)

## 2020-02-10 LAB — CULTURE, BLOOD (ROUTINE X 2)
Culture: NO GROWTH
Culture: NO GROWTH
Special Requests: ADEQUATE

## 2020-02-11 DIAGNOSIS — N390 Urinary tract infection, site not specified: Secondary | ICD-10-CM | POA: Diagnosis not present

## 2020-02-11 DIAGNOSIS — N200 Calculus of kidney: Secondary | ICD-10-CM | POA: Diagnosis not present

## 2020-02-11 DIAGNOSIS — R319 Hematuria, unspecified: Secondary | ICD-10-CM | POA: Diagnosis not present

## 2020-02-11 DIAGNOSIS — E538 Deficiency of other specified B group vitamins: Secondary | ICD-10-CM | POA: Diagnosis not present

## 2020-02-15 ENCOUNTER — Other Ambulatory Visit: Payer: Self-pay | Admitting: Urology

## 2020-02-18 DIAGNOSIS — E1165 Type 2 diabetes mellitus with hyperglycemia: Secondary | ICD-10-CM | POA: Diagnosis not present

## 2020-02-18 DIAGNOSIS — I480 Paroxysmal atrial fibrillation: Secondary | ICD-10-CM | POA: Diagnosis not present

## 2020-02-18 DIAGNOSIS — N1831 Chronic kidney disease, stage 3a: Secondary | ICD-10-CM | POA: Diagnosis not present

## 2020-02-18 NOTE — Telephone Encounter (Signed)
Patient assistance for [employer] for the coming year 2022 faxed to [facility] patient assistance at fax # [phone].

## 2020-02-24 NOTE — Patient Instructions (Addendum)
DUE TO COVID-19 ONLY ONE VISITOR IS ALLOWED TO COME WITH YOU AND STAY IN THE WAITING ROOM ONLY DURING PRE OP AND PROCEDURE DAY OF SURGERY. THE 1 VISITOR  MAY VISIT WITH YOU AFTER SURGERY IN YOUR PRIVATE ROOM DURING VISITING HOURS ONLY!  YOU NEED TO HAVE A COVID 19 TEST ON: 02/27/20 @ 11:00 AM , THIS TEST MUST BE DONE BEFORE SURGERY,  COVID TESTING SITE [place] [address], IT IS ON THE RIGHT GOING OUT [address] APPROXIMATELY  2 MINUTES PAST [employer] ON THE RIGHT. ONCE YOUR COVID TEST IS COMPLETED,  PLEASE BEGIN THE QUARANTINE INSTRUCTIONS AS OUTLINED IN YOUR HANDOUT.                Shawn Hernandez    Your procedure is scheduled on: 03/02/20   Report to [facility] Main  Entrance   Report to admitting at: 9:45 AM     Call this number if you have problems the morning of surgery [phone]    Remember: Do not eat solid food :After Midnight. Clear liquids until: 8:45 am.  BRUSH YOUR TEETH MORNING OF SURGERY AND RINSE YOUR MOUTH OUT, NO CHEWING GUM CANDY OR MINTS.    Take these medicines the morning of surgery with A SIP OF WATER: droneradone.   How to Manage Your Diabetes Before and After Surgery  Why is it important to control my blood sugar before and after surgery?  Improving blood sugar levels before and after surgery helps healing and can limit problems.  A way of improving blood sugar control is eating a healthy diet by: o  Eating less sugar and carbohydrates o  Increasing activity/exercise o  Talking with your doctor about reaching your blood sugar goals  High blood sugars (greater than 180 mg/dL) can raise your risk of infections and slow your recovery, so you will need to focus on controlling your diabetes during the weeks before surgery.  Make sure that the doctor who takes care of your diabetes knows about your planned surgery including the date and location.  How do I manage my blood sugar before surgery?  Check your  blood sugar at least 4 times a day, starting 2 days before surgery, to make sure that the level is not too high or low. o Check your blood sugar the morning of your surgery when you wake up and every 2 hours until you get to the Short Stay unit.  If your blood sugar is less than 70 mg/dL, you will need to treat for low blood sugar: o Do not take insulin. o Treat a low blood sugar (less than 70 mg/dL) with  cup of clear juice (cranberry or apple), 4 glucose tablets, OR glucose gel. o Recheck blood sugar in 15 minutes after treatment (to make sure it is greater than 70 mg/dL). If your blood sugar is not greater than 70 mg/dL on recheck, call [phone] for further instructions.  Report your blood sugar to the short stay nurse when you get to Short Stay.   If you are admitted to the hospital after surgery: o Your blood sugar will be checked by the staff and you will probably be given insulin after surgery (instead of oral diabetes medicines) to make sure you have good blood sugar levels. o The goal for blood sugar control after surgery is 80-180 mg/dL.   WHAT DO I DO ABOUT MY DIABETES MEDICATION?   Do not take oral diabetes medicines (pills) the morning  of surgery.   THE DAY BEFORE SURGERY, take glimepiride as usual      THE MORNING OF SURGERY,    DO NOT TAKE ANY DIABETIC MEDICATIONS DAY OF YOUR SURGERY    The day of surgery, do not take other diabetes injectables, including Byetta (exenatide), Bydureon (exenatide ER), Victoza (liraglutide), or Trulicity (dulaglutide).              You may not have any metal on your body including hair pins and              piercings  Do not wear jewelry, lotions, powders or perfumes, deodorant             Men may shave face and neck.   Do not bring valuables to the hospital. [place].  Contacts, dentures or bridgework may not be worn into surgery.  Leave suitcase in the car. After surgery it  may be brought to your room.     Patients discharged the day of surgery will not be allowed to drive home. IF YOU ARE HAVING SURGERY AND GOING HOME THE SAME DAY, YOU MUST HAVE AN ADULT TO DRIVE YOU HOME AND BE WITH YOU FOR 24 HOURS. YOU MAY GO HOME BY TAXI OR UBER OR ORTHERWISE, BUT AN ADULT MUST ACCOMPANY YOU HOME AND STAY WITH YOU FOR 24 HOURS.  Name and phone number of your driver:  Special Instructions: N/A              Please read over the following fact sheets you were given: _____________________________________________________________________        [facility] - Preparing for Surgery Before surgery, you can play an important role.  Because skin is not sterile, your skin needs to be as free of germs as possible.  You can reduce the number of germs on your skin by washing with CHG (chlorahexidine gluconate) soap before surgery.  CHG is an antiseptic cleaner which kills germs and bonds with the skin to continue killing germs even after washing. Please DO NOT use if you have an allergy to CHG or antibacterial soaps.  If your skin becomes reddened/irritated stop using the CHG and inform your nurse when you arrive at Short Stay. Do not shave (including legs and underarms) for at least 48 hours prior to the first CHG shower.  You may shave your face/neck. Please follow these instructions carefully:  1.  Shower with CHG Soap the night before surgery and the  morning of Surgery.  2.  If you choose to wash your hair, wash your hair first as usual with your  normal  shampoo.  3.  After you shampoo, rinse your hair and body thoroughly to remove the  shampoo.                           4.  Use CHG as you would any other liquid soap.  You can apply chg directly  to the skin and wash                       Gently with a scrungie or clean washcloth.  5.  Apply the CHG Soap to your body ONLY FROM THE NECK DOWN.   Do not use on face/ open  Wound or open sores. Avoid contact with  eyes, ears mouth and genitals (private parts).                       Wash face,  Genitals (private parts) with your normal soap.             6.  Wash thoroughly, paying special attention to the area where your surgery  will be performed.  7.  Thoroughly rinse your body with warm water from the neck down.  8.  DO NOT shower/wash with your normal soap after using and rinsing off  the CHG Soap.                9.  Pat yourself dry with a clean towel.            10.  Wear clean pajamas.            11.  Place clean sheets on your bed the night of your first shower and do not  sleep with pets. Day of Surgery : Do not apply any lotions/deodorants the morning of surgery.  Please wear clean clothes to the hospital/surgery center.  FAILURE TO FOLLOW THESE INSTRUCTIONS MAY RESULT IN THE CANCELLATION OF YOUR SURGERY PATIENT SIGNATURE_________________________________  NURSE SIGNATURE__________________________________  ________________________________________________________________________

## 2020-02-26 ENCOUNTER — Encounter (HOSPITAL_COMMUNITY): Payer: Self-pay

## 2020-02-26 ENCOUNTER — Encounter (HOSPITAL_COMMUNITY)
Admission: RE | Admit: 2020-02-26 | Discharge: 2020-02-26 | Disposition: A | Discharge status: Home / Self Care | Payer: PPO | Source: Ambulatory Visit | Attending: Urology | Admitting: Urology

## 2020-02-26 ENCOUNTER — Other Ambulatory Visit: Payer: Self-pay

## 2020-02-26 ENCOUNTER — Telehealth: Payer: Self-pay | Admitting: Cardiology

## 2020-02-26 DIAGNOSIS — Z7901 Long term (current) use of anticoagulants: Secondary | ICD-10-CM | POA: Diagnosis not present

## 2020-02-26 DIAGNOSIS — I2510 Atherosclerotic heart disease of native coronary artery without angina pectoris: Secondary | ICD-10-CM | POA: Diagnosis not present

## 2020-02-26 DIAGNOSIS — Z79899 Other long term (current) drug therapy: Secondary | ICD-10-CM | POA: Insufficient documentation

## 2020-02-26 DIAGNOSIS — N183 Chronic kidney disease, stage 3 unspecified: Secondary | ICD-10-CM | POA: Diagnosis not present

## 2020-02-26 DIAGNOSIS — I129 Hypertensive chronic kidney disease with stage 1 through stage 4 chronic kidney disease, or unspecified chronic kidney disease: Secondary | ICD-10-CM | POA: Diagnosis not present

## 2020-02-26 DIAGNOSIS — E1122 Type 2 diabetes mellitus with diabetic chronic kidney disease: Secondary | ICD-10-CM | POA: Diagnosis not present

## 2020-02-26 DIAGNOSIS — N202 Calculus of kidney with calculus of ureter: Secondary | ICD-10-CM | POA: Insufficient documentation

## 2020-02-26 DIAGNOSIS — Z01812 Encounter for preprocedural laboratory examination: Secondary | ICD-10-CM | POA: Diagnosis not present

## 2020-02-26 DIAGNOSIS — Z7984 Long term (current) use of oral hypoglycemic drugs: Secondary | ICD-10-CM | POA: Insufficient documentation

## 2020-02-26 HISTORY — DX: Personal history of urinary calculi: Z87.442

## 2020-02-26 HISTORY — DX: Cardiac arrhythmia, unspecified: I49.9

## 2020-02-26 LAB — BASIC METABOLIC PANEL
Anion gap: 10 (ref 5–15)
BUN: 17 mg/dL (ref 8–23)
CO2: 24 mmol/L (ref 22–32)
Calcium: 9.6 mg/dL (ref 8.9–10.3)
Chloride: 103 mmol/L (ref 98–111)
Creatinine, Ser: 1.8 mg/dL — ABNORMAL HIGH (ref 0.61–1.24)
GFR, Estimated: 40 mL/min — ABNORMAL LOW (ref >60)
Glucose, Bld: 130 mg/dL — ABNORMAL HIGH (ref 70–99)
Potassium: 4.2 mmol/L (ref 3.5–5.1)
Sodium: 137 mmol/L (ref 135–145)

## 2020-02-26 LAB — GLUCOSE, CAPILLARY: Glucose-Capillary: 134 mg/dL — ABNORMAL HIGH (ref 70–99)

## 2020-02-26 LAB — CBC
HCT: 41 % (ref 39.0–52.0)
Hemoglobin: 13.8 g/dL (ref 13.0–17.0)
MCH: 30.9 pg (ref 26.0–34.0)
MCHC: 33.7 g/dL (ref 30.0–36.0)
MCV: 91.7 fL (ref 80.0–100.0)
Platelets: 342 10*3/uL (ref 150–400)
RBC: 4.47 MIL/uL (ref 4.22–5.81)
RDW: 12 % (ref 11.5–15.5)
WBC: 8.2 10*3/uL (ref 4.0–10.5)
nRBC: 0 % (ref 0.0–0.2)

## 2020-02-26 NOTE — Telephone Encounter (Signed)
Patient wanted to let Dr. Martinique know that he spoke with his dr at [facility] and they have decided for him to continue with Eliquis.

## 2020-02-26 NOTE — Progress Notes (Signed)
COVID Vaccine Completed: Yes Date COVID Vaccine completed: 12/12/19 [place] COVID vaccine manufacturer: Moderna      PCP - Dr. [name] Cardiologist - Dr. Peter Martinique  Chest x-ray -  EKG - 08/17/19 EPIC Stress Test -  ECHO -  Cardiac Cath -  Pacemaker/ICD device last checked: A1-C: 6.5 : 02/16/20 Sleep Study -  CPAP -   Fasting Blood Sugar -  Checks Blood Sugar _____ times a day  Blood Thinner Instructions: Pt. Was advised to call cardiologist and ask for Eliquis instructions. Aspirin Instructions: Last Dose:  Anesthesia review: Hx: DIA,A flutter,HTN  Patient denies shortness of breath, fever, cough and chest pain at PAT appointment   Patient verbalized understanding of instructions that were given to them at the PAT appointment. Patient was also instructed that they will need to review over the PAT instructions again at home before surgery.

## 2020-02-26 NOTE — Telephone Encounter (Signed)
I did not need this encounter. °

## 2020-02-27 ENCOUNTER — Other Ambulatory Visit (HOSPITAL_COMMUNITY)
Admission: RE | Admit: 2020-02-27 | Discharge: 2020-02-27 | Disposition: A | Discharge status: Home / Self Care | Payer: PPO | Source: Ambulatory Visit | Attending: Urology | Admitting: Urology

## 2020-02-27 DIAGNOSIS — Z20822 Contact with and (suspected) exposure to covid-19: Secondary | ICD-10-CM | POA: Diagnosis not present

## 2020-02-27 DIAGNOSIS — Z01812 Encounter for preprocedural laboratory examination: Secondary | ICD-10-CM | POA: Diagnosis not present

## 2020-02-27 LAB — SARS CORONAVIRUS 2 (TAT 6-24 HRS): SARS Coronavirus 2: NEGATIVE

## 2020-02-29 NOTE — Anesthesia Preprocedure Evaluation (Addendum)
Anesthesia Evaluation  Patient identified by MRN, date of birth, ID band Patient awake    Reviewed: Allergy & Precautions, NPO status , Patient's Chart, lab work & pertinent test results  History of Anesthesia Complications Negative for: history of anesthetic complications  Airway Mallampati: II  TM Distance: >3 FB Neck ROM: Full    Dental  (+) Dental Advisory Given   Pulmonary neg pulmonary ROS,    Pulmonary exam normal        Cardiovascular hypertension, + CAD  Normal cardiovascular exam+ dysrhythmias Atrial Fibrillation      Neuro/Psych negative neurological ROS  negative psych ROS   GI/Hepatic negative GI ROS, Neg liver ROS,   Endo/Other  diabetes, Type 2, Oral Hypoglycemic Agents Obesity   Renal/GU CRFRenal disease     Musculoskeletal  (+) Arthritis ,   Abdominal   Peds  Hematology negative hematology ROS (+)  On eliquis    Anesthesia Other Findings Covid test negative   Reproductive/Obstetrics                           Anesthesia Physical Anesthesia Plan  ASA: III  Anesthesia Plan: General   Post-op Pain Management:    Induction: Intravenous  PONV Risk Score and Plan: 3 and Treatment may vary due to age or medical condition, Ondansetron and Dexamethasone  Airway Management Planned: LMA  Additional Equipment: None  Intra-op Plan:   Post-operative Plan: Extubation in OR  Informed Consent: I have reviewed the patients History and Physical, chart, labs and discussed the procedure including the risks, benefits and alternatives for the proposed anesthesia with the patient or authorized representative who has indicated his/her understanding and acceptance.     Dental advisory given  Plan Discussed with: CRNA and Anesthesiologist  Anesthesia Plan Comments:       Anesthesia Quick Evaluation

## 2020-02-29 NOTE — Progress Notes (Signed)
Anesthesia Chart Review   Case: [id number] Date/Time: 03/02/20 1130   Procedures:      CYSTOSCOPY WITH RETROGRADE PYELOGRAM, URETEROSCOPY AND STENT EXCHANGE (Bilateral ) - 101 MINS     HOLMIUM LASER APPLICATION (Bilateral )   Anesthesia type: General   Pre-op diagnosis: BILATERAL RENAL AND URETERAL STONES   Location: [facility]OM 03 / [facility]   Surgeons: [name], MD      DISCUSSION:69 y.o. never smoker with h/o HTN, CAD (BMS 2010), DM II, PAF(on Eliquis, will continue for procedure; rate controlled on Multaq), CKD Stage III (baseline creatinine 1.3-1.5), bilateral renal and ureteral stones scheduled for above procedure 03/02/2020 with Dr. [name].   Low risk stress test 07/23/17.   Pt last seen by cardiology 11/06/2019, stable at this visit, no changes made.  6 month follow up recommended.    Anticipate pt can proceed with planned procedure barring acute status change.   VS: BP 119/76   Pulse 75   Temp 36.7 C (Oral)   Ht 6' (1.829 m)   Wt 104.3 kg   SpO2 99%   BMI 31.19 kg/m   PROVIDERS: [name], MD is PCP   Martinique, Peter, MD is Cardiologist  LABS: Labs reviewed: Acceptable for surgery. (all labs ordered are listed, but only abnormal results are displayed)  Labs Reviewed  BASIC METABOLIC PANEL - Abnormal; Notable for the following components:      Result Value   Glucose, Bld 130 (*)    Creatinine, Ser 1.80 (*)    GFR, Estimated 40 (*)    All other components within normal limits  GLUCOSE, CAPILLARY - Abnormal; Notable for the following components:   Glucose-Capillary 134 (*)    All other components within normal limits  CBC     IMAGES:   EKG: 08/17/2019 Rate 81 bpm  Sinus rhythm Probable left atrial enlargement Borderline left axis deviation Abnormal R-wave progression, early transition No significant change since last tracing  CV: Stress Test 07/23/2017  The left ventricular ejection fraction is mildly decreased (45-54%).  Nuclear  stress EF: 54%.  There was no ST segment deviation noted during stress.  This is a low risk study.   1. EF 54%, low normal to mildly reduced.  2. No evidence for ischemia or infarction on perfusion images.   Low risk study.   Past Medical History:  Diagnosis Date  . Allergy to iodine   . Arthritis    discomfort in hands and arms  . Atrial flutter, paroxysmal ([place])   . Coronary artery disease   . Diabetes type 2, controlled ([place]) 10/2016  . Diverticulosis   . Dysrhythmia     A flutter  . History of kidney stones   . Hypercholesterolemia   . Hypertension   . Psoriatic arthritis ([place])   . Sinusitis     Past Surgical History:  Procedure Laterality Date  . A-FLUTTER ABLATION N/A 10/17/2017   Procedure: A-FLUTTER ABLATION;  Surgeon: Constance Haw, MD;  Location: [place] [facility];  Service: Cardiovascular;  Laterality: N/A;  . COLON SURGERY  2001   2 feet removed re- diverticulosis  . CORONARY STENT PLACEMENT  2011   sequential bare-metal  . CYSTOSCOPY W/ URETERAL STENT PLACEMENT Bilateral 02/05/2020   Procedure: CYSTOSCOPY WITH RETROGRADE PYELOGRAM/URETERAL STENT PLACEMENT;  Surgeon: [name], MD;  Location: [facility];  Service: Urology;  Laterality: Bilateral;  . INGUINAL HERNIA REPAIR    . OTHER SURGICAL HISTORY     appendectomy  MEDICATIONS: . apixaban (ELIQUIS) 5 MG TABS tablet  . blood glucose meter kit and supplies  . cyanocobalamin (,VITAMIN B-12,) 1000 MCG/ML injection  . diphenhydrAMINE (BENADRYL) 25 MG tablet  . dronedarone (MULTAQ) 400 MG tablet  . Dulaglutide 1.5 MG/0.5ML SOPN  . ezetimibe (ZETIA) 10 MG tablet  . fenofibrate (TRICOR) 145 MG tablet  . glimepiride (AMARYL) 4 MG tablet  . Magnesium 400 MG TABS  . rosuvastatin (CRESTOR) 5 MG tablet   No current facility-administered medications for this encounter.     Konrad Felix, PA-C [facility] [phone]

## 2020-03-01 ENCOUNTER — Telehealth: Payer: Self-pay | Admitting: Cardiology

## 2020-03-01 MED ORDER — GENTAMICIN SULFATE 40 MG/ML IJ SOLN
5.0000 mg/kg | INTRAVENOUS | Status: AC
  Administered 2020-03-02: 440 mg via INTRAVENOUS
  Filled 2020-03-01: qty 11

## 2020-03-01 NOTE — Telephone Encounter (Signed)
New Message:   Please call, need to talk you about his Multaq

## 2020-03-01 NOTE — Telephone Encounter (Signed)
Spoke to patient he stated he spoke to [employer] patient assistance.He was told they need diagnosis code on page 4 of application faxed to them.Advised diagnosis code on page 4  AFlutter- [id number].[id number] 4 of application refaxed to [employer] at # [phone].

## 2020-03-02 ENCOUNTER — Ambulatory Visit (HOSPITAL_COMMUNITY): Payer: PPO | Admitting: Physician Assistant

## 2020-03-02 ENCOUNTER — Encounter (HOSPITAL_COMMUNITY)
Admission: RE | Disposition: A | Discharge status: Home / Self Care | Payer: Self-pay | Source: Home / Self Care | Attending: Urology

## 2020-03-02 ENCOUNTER — Telehealth: Payer: Self-pay | Admitting: Cardiology

## 2020-03-02 ENCOUNTER — Encounter (HOSPITAL_COMMUNITY): Payer: Self-pay | Admitting: Urology

## 2020-03-02 ENCOUNTER — Ambulatory Visit (HOSPITAL_COMMUNITY)
Admission: RE | Admit: 2020-03-02 | Discharge: 2020-03-02 | Disposition: A | Discharge status: Home / Self Care | Payer: PPO | Attending: Urology | Admitting: Urology

## 2020-03-02 ENCOUNTER — Ambulatory Visit (HOSPITAL_COMMUNITY): Payer: PPO | Admitting: Anesthesiology

## 2020-03-02 ENCOUNTER — Ambulatory Visit (HOSPITAL_COMMUNITY): Payer: PPO

## 2020-03-02 DIAGNOSIS — N189 Chronic kidney disease, unspecified: Secondary | ICD-10-CM | POA: Insufficient documentation

## 2020-03-02 DIAGNOSIS — Z87442 Personal history of urinary calculi: Secondary | ICD-10-CM | POA: Insufficient documentation

## 2020-03-02 DIAGNOSIS — Z955 Presence of coronary angioplasty implant and graft: Secondary | ICD-10-CM | POA: Insufficient documentation

## 2020-03-02 DIAGNOSIS — I129 Hypertensive chronic kidney disease with stage 1 through stage 4 chronic kidney disease, or unspecified chronic kidney disease: Secondary | ICD-10-CM | POA: Diagnosis not present

## 2020-03-02 DIAGNOSIS — I4892 Unspecified atrial flutter: Secondary | ICD-10-CM | POA: Insufficient documentation

## 2020-03-02 DIAGNOSIS — I4891 Unspecified atrial fibrillation: Secondary | ICD-10-CM | POA: Insufficient documentation

## 2020-03-02 DIAGNOSIS — Z91041 Radiographic dye allergy status: Secondary | ICD-10-CM | POA: Diagnosis not present

## 2020-03-02 DIAGNOSIS — Z7901 Long term (current) use of anticoagulants: Secondary | ICD-10-CM | POA: Insufficient documentation

## 2020-03-02 DIAGNOSIS — Z8249 Family history of ischemic heart disease and other diseases of the circulatory system: Secondary | ICD-10-CM | POA: Diagnosis not present

## 2020-03-02 DIAGNOSIS — L4050 Arthropathic psoriasis, unspecified: Secondary | ICD-10-CM | POA: Insufficient documentation

## 2020-03-02 DIAGNOSIS — N202 Calculus of kidney with calculus of ureter: Secondary | ICD-10-CM | POA: Diagnosis not present

## 2020-03-02 DIAGNOSIS — N179 Acute kidney failure, unspecified: Secondary | ICD-10-CM | POA: Insufficient documentation

## 2020-03-02 DIAGNOSIS — Z888 Allergy status to other drugs, medicaments and biological substances status: Secondary | ICD-10-CM | POA: Diagnosis not present

## 2020-03-02 DIAGNOSIS — Z9049 Acquired absence of other specified parts of digestive tract: Secondary | ICD-10-CM | POA: Insufficient documentation

## 2020-03-02 DIAGNOSIS — I2510 Atherosclerotic heart disease of native coronary artery without angina pectoris: Secondary | ICD-10-CM | POA: Insufficient documentation

## 2020-03-02 DIAGNOSIS — N1832 Chronic kidney disease, stage 3b: Secondary | ICD-10-CM | POA: Diagnosis not present

## 2020-03-02 DIAGNOSIS — Z833 Family history of diabetes mellitus: Secondary | ICD-10-CM | POA: Diagnosis not present

## 2020-03-02 DIAGNOSIS — E1122 Type 2 diabetes mellitus with diabetic chronic kidney disease: Secondary | ICD-10-CM | POA: Insufficient documentation

## 2020-03-02 DIAGNOSIS — N1831 Chronic kidney disease, stage 3a: Secondary | ICD-10-CM | POA: Diagnosis not present

## 2020-03-02 HISTORY — PX: HOLMIUM LASER APPLICATION: SHX5852

## 2020-03-02 HISTORY — PX: CYSTOSCOPY WITH RETROGRADE PYELOGRAM, URETEROSCOPY AND STENT PLACEMENT: SHX5789

## 2020-03-02 LAB — GLUCOSE, CAPILLARY
Glucose-Capillary: 90 mg/dL (ref 70–99)
Glucose-Capillary: 78 mg/dL (ref 70–99)

## 2020-03-02 SURGERY — CYSTOURETEROSCOPY, WITH RETROGRADE PYELOGRAM AND STENT INSERTION
Anesthesia: General | Laterality: Bilateral

## 2020-03-02 MED ORDER — OXYCODONE HCL 5 MG PO TABS
5.0000 mg | ORAL_TABLET | Freq: Once | ORAL | Status: AC | PRN
  Administered 2020-03-02: 5 mg via ORAL

## 2020-03-02 MED ORDER — OXYCODONE HCL 5 MG/5ML PO SOLN
5.0000 mg | Freq: Once | ORAL | Status: AC | PRN
Start: 2020-03-02 — End: 2020-03-02

## 2020-03-02 MED ORDER — HYDROMORPHONE HCL 1 MG/ML IJ SOLN
0.2500 mg | INTRAMUSCULAR | Status: DC | PRN
  Administered 2020-03-02 (×2): 0.5 mg via INTRAVENOUS

## 2020-03-02 MED ORDER — ONDANSETRON HCL 4 MG/2ML IJ SOLN
INTRAMUSCULAR | Status: AC
  Filled 2020-03-02: qty 2

## 2020-03-02 MED ORDER — FENTANYL CITRATE (PF) 100 MCG/2ML IJ SOLN
INTRAMUSCULAR | Status: AC
  Filled 2020-03-02: qty 2

## 2020-03-02 MED ORDER — OXYCODONE HCL 5 MG PO TABS
ORAL_TABLET | ORAL | Status: AC
  Filled 2020-03-02: qty 1

## 2020-03-02 MED ORDER — LACTATED RINGERS IV SOLN: INTRAVENOUS | Status: DC

## 2020-03-02 MED ORDER — PROPOFOL 10 MG/ML IV BOLUS
INTRAVENOUS | Status: DC | PRN
  Administered 2020-03-02: 150 mg via INTRAVENOUS

## 2020-03-02 MED ORDER — PROPOFOL 10 MG/ML IV BOLUS
INTRAVENOUS | Status: AC
  Filled 2020-03-02: qty 20

## 2020-03-02 MED ORDER — CEPHALEXIN 500 MG PO CAPS: 500.0000 mg | ORAL_CAPSULE | Freq: Two times a day (BID) | ORAL | 0 refills | Status: AC

## 2020-03-02 MED ORDER — ONDANSETRON HCL 4 MG/2ML IJ SOLN
INTRAMUSCULAR | Status: DC | PRN
  Administered 2020-03-02: 4 mg via INTRAVENOUS

## 2020-03-02 MED ORDER — MIDAZOLAM HCL 2 MG/2ML IJ SOLN
INTRAMUSCULAR | Status: AC
  Filled 2020-03-02: qty 2

## 2020-03-02 MED ORDER — LIDOCAINE HCL 1 % IJ SOLN
INTRAMUSCULAR | Status: DC | PRN
  Administered 2020-03-02: 50 mg via INTRADERMAL

## 2020-03-02 MED ORDER — ONDANSETRON HCL 4 MG/2ML IJ SOLN: 4.0000 mg | Freq: Once | INTRAMUSCULAR | Status: DC | PRN

## 2020-03-02 MED ORDER — IOHEXOL 300 MG/ML  SOLN
INTRAMUSCULAR | Status: DC | PRN
  Administered 2020-03-02: 29 mL

## 2020-03-02 MED ORDER — SENNOSIDES-DOCUSATE SODIUM 8.6-50 MG PO TABS: 1.0000 | ORAL_TABLET | Freq: Two times a day (BID) | ORAL | 0 refills | Status: DC

## 2020-03-02 MED ORDER — HYDROMORPHONE HCL 1 MG/ML IJ SOLN
INTRAMUSCULAR | Status: AC
  Filled 2020-03-02: qty 1

## 2020-03-02 MED ORDER — ORAL CARE MOUTH RINSE: 15.0000 mL | Freq: Once | OROMUCOSAL | Status: AC

## 2020-03-02 MED ORDER — FENTANYL CITRATE (PF) 100 MCG/2ML IJ SOLN
INTRAMUSCULAR | Status: DC | PRN
  Administered 2020-03-02: 25 ug via INTRAVENOUS
  Administered 2020-03-02: 50 ug via INTRAVENOUS
  Administered 2020-03-02 (×3): 25 ug via INTRAVENOUS
  Administered 2020-03-02: 15 ug via INTRAVENOUS

## 2020-03-02 MED ORDER — OXYCODONE-ACETAMINOPHEN 5-325 MG PO TABS: 1.0000 | ORAL_TABLET | Freq: Four times a day (QID) | ORAL | 0 refills | Status: DC | PRN

## 2020-03-02 MED ORDER — LIDOCAINE HCL (PF) 2 % IJ SOLN
INTRAMUSCULAR | Status: AC
  Filled 2020-03-02: qty 5

## 2020-03-02 MED ORDER — FENTANYL CITRATE (PF) 100 MCG/2ML IJ SOLN
25.0000 ug | INTRAMUSCULAR | Status: DC | PRN
  Administered 2020-03-02 (×3): 50 ug via INTRAVENOUS

## 2020-03-02 MED ORDER — SODIUM CHLORIDE 0.9 % IR SOLN
Status: DC | PRN
  Administered 2020-03-02: 6000 mL

## 2020-03-02 MED ORDER — MIDAZOLAM HCL 5 MG/5ML IJ SOLN
INTRAMUSCULAR | Status: DC | PRN
  Administered 2020-03-02: 1 mg via INTRAVENOUS

## 2020-03-02 MED ORDER — CHLORHEXIDINE GLUCONATE 0.12 % MT SOLN
15.0000 mL | Freq: Once | OROMUCOSAL | Status: AC
  Administered 2020-03-02: 15 mL via OROMUCOSAL

## 2020-03-02 SURGICAL SUPPLY — 22 items
BAG URO CATCHER STRL LF (MISCELLANEOUS) ×2 IMPLANT
BASKET LASER NITINOL 1.9FR (BASKET) ×2 IMPLANT
CATH INTERMIT  6FR 70CM (CATHETERS) ×2 IMPLANT
CLOTH BEACON ORANGE TIMEOUT ST (SAFETY) ×2 IMPLANT
EXTRACTOR STONE 1.7FRX115CM (UROLOGICAL SUPPLIES) IMPLANT
GLOVE SURG ENC TEXT LTX SZ7.5 (GLOVE) ×2 IMPLANT
GOWN STRL REUS W/TWL LRG LVL3 (GOWN DISPOSABLE) ×4 IMPLANT
GUIDEWIRE ANG ZIPWIRE 038X150 (WIRE) ×4 IMPLANT
GUIDEWIRE STR DUAL SENSOR (WIRE) ×4 IMPLANT
KIT TURNOVER KIT A (KITS) IMPLANT
LASER FIB FLEXIVA PULSE ID 365 (Laser) IMPLANT
LASER FIB FLEXIVA PULSE ID 550 (Laser) IMPLANT
LASER FIB FLEXIVA PULSE ID 910 (Laser) IMPLANT
MANIFOLD NEPTUNE II (INSTRUMENTS) ×2 IMPLANT
PACK CYSTO (CUSTOM PROCEDURE TRAY) ×2 IMPLANT
SHEATH URETERAL 12FRX28CM (UROLOGICAL SUPPLIES) IMPLANT
SHEATH URETERAL 12FRX35CM (MISCELLANEOUS) ×2 IMPLANT
TRACTIP FLEXIVA PULS ID 200XHI (Laser) ×1 IMPLANT
TRACTIP FLEXIVA PULSE ID 200 (Laser) ×1
TUBE FEEDING 8FR 16IN STR KANG (MISCELLANEOUS) ×2 IMPLANT
TUBING CONNECTING 10 (TUBING) ×2 IMPLANT
TUBING UROLOGY SET (TUBING) IMPLANT

## 2020-03-02 NOTE — OR Nursing (Signed)
Stone taken per Dr. Manny 

## 2020-03-02 NOTE — Anesthesia Procedure Notes (Signed)
Procedure Name: LMA Insertion Date/Time: 03/02/2020 11:52 AM Performed by: Garrel Ridgel, CRNA Pre-anesthesia Checklist: Patient identified, Emergency Drugs available, Suction available and Patient being monitored Patient Re-evaluated:Patient Re-evaluated prior to induction Oxygen Delivery Method: Circle system utilized Preoxygenation: Pre-oxygenation with 100% oxygen Induction Type: IV induction Ventilation: Mask ventilation without difficulty LMA: LMA inserted LMA Size: 4.0 Number of attempts: 1 Placement Confirmation: positive ETCO2 Tube secured with: Tape Dental Injury: Teeth and Oropharynx as per pre-operative assessment

## 2020-03-02 NOTE — Anesthesia Postprocedure Evaluation (Signed)
Anesthesia Post Note  Patient: Shawn Hernandez  Procedure(s) Performed: CYSTOSCOPY WITH RETROGRADE PYELOGRAM, URETEROSCOPY,AND STENT EXCHANGE (Bilateral ) HOLMIUM LASER APPLICATION (Bilateral )     Patient location during evaluation: PACU Anesthesia Type: General Level of consciousness: awake and alert Pain management: pain level controlled Vital Signs Assessment: post-procedure vital signs reviewed and stable Respiratory status: spontaneous breathing, nonlabored ventilation and respiratory function stable Cardiovascular status: blood pressure returned to baseline and stable Postop Assessment: no apparent nausea or vomiting Anesthetic complications: no   No complications documented.  Last Vitals:  Vitals:   03/02/20 1415 03/02/20 1425  BP: (!) 142/67 (!) 159/91  Pulse: 77 78  Resp: 13 14  Temp:  36.8 C  SpO2: 98% 98%    Last Pain:  Vitals:   03/02/20 1425  TempSrc:   PainSc: [place]

## 2020-03-02 NOTE — Transfer of Care (Signed)
Immediate Anesthesia Transfer of Care Note  Patient: Shawn Hernandez  Procedure(s) Performed: CYSTOSCOPY WITH RETROGRADE PYELOGRAM, URETEROSCOPY,AND STENT EXCHANGE (Bilateral ) HOLMIUM LASER APPLICATION (Bilateral )  Patient Location: PACU  Anesthesia Type:General  Level of Consciousness: awake, alert , oriented and patient cooperative  Airway & Oxygen Therapy: Patient Spontanous Breathing and Patient connected to face mask oxygen  Post-op Assessment: Report given to RN, Post -op Vital signs reviewed and stable and Patient moving all extremities  Post vital signs: Reviewed and stable  Last Vitals:  Vitals Value Taken Time  BP 156/102 03/02/20 1320  Temp    Pulse 86 03/02/20 1321  Resp 14 03/02/20 1321  SpO2 100 % 03/02/20 1321  Vitals shown include unvalidated device data.  Last Pain:  Vitals:   03/02/20 0948  TempSrc:   PainSc: 0-No pain         Complications: No complications documented.

## 2020-03-02 NOTE — Brief Op Note (Signed)
03/02/2020  1:09 PM  PATIENT:  Shawn Hernandez  70 y.o. male  PRE-OPERATIVE DIAGNOSIS:  BILATERAL RENAL AND URETERAL STONES  POST-OPERATIVE DIAGNOSIS:  BILATERAL RENAL AND URETERAL STONES  PROCEDURE:  Procedure(s) with comments: CYSTOSCOPY WITH RETROGRADE PYELOGRAM, URETEROSCOPY,AND STENT EXCHANGE (Bilateral) - 75 MINS HOLMIUM LASER APPLICATION (Bilateral)  SURGEON:  Surgeon(s) and Role:    [name], MD - Primary  PHYSICIAN ASSISTANT:   ASSISTANTS: none   ANESTHESIA:   general  EBL:  minimal   BLOOD ADMINISTERED:none  DRAINS: none   LOCAL MEDICATIONS USED:  NONE  SPECIMEN:  Source of Specimen:  bilateral urolithiasis  DISPOSITION OF SPECIMEN:  [facility] for compositional analysis  COUNTS:  YES  TOURNIQUET:  * No tourniquets in log *  DICTATION: .Other Dictation: Dictation Number  [id number]  PLAN OF CARE: Discharge to home after PACU  PATIENT DISPOSITION:  PACU - hemodynamically stable.   Delay start of Pharmacological VTE agent (>24hrs) due to surgical blood loss or risk of bleeding: not applicable

## 2020-03-02 NOTE — Discharge Instructions (Signed)
.  1 - You may have urinary urgency (bladder spasms) and bloody urine on / off with stent in place. This is normal.  2 - Remove tethered stents on Friday morning at home by pulling on strings, then blue-white plastic tubing, and discarding. Office is open Friday if any problems arise.   3 - Call MD or go to ER for fever >102, severe pain / nausea / vomiting not relieved by medications, or acute change in medical status

## 2020-03-02 NOTE — H&P (Signed)
Shawn Hernandez is an 69 y.o. male.    Chief Complaint: Pre-Op BILATERAL Ureteroscopic Stone Manipulation  HPI:   1 - Left Ureteral / Bilateral Renal Stones - left 24mm proxial stone with mild hydro and scattered Rt renal stones (no hydro, abour 71mm total volume) by ER CT at [facility] on eval few days left flank pain. No fevers. UA without infectious parameters. Treated with bilateral stents 02/05/20.   2 - Acute on Chronic Renal Failure -  Cr 2.5, normal K up from baseline Cr 1.5's by Er labs.   PMH sig for AFib/Eliquus, partial colectomy with re-anastamosis, left inguinal hernia repair with mesh, DM2, obesity. His PCP is Shawn Seashore MD.   Today " Shawn Hernandez " is seen to proceed with BILATERAL ureteroscopic stone manipulation. C19 screen negative.  Most recent UCX non-clonal.  Past Medical History:  Diagnosis Date  . Allergy to iodine   . Arthritis    discomfort in hands and arms  . Atrial flutter, paroxysmal ([place])   . Coronary artery disease   . Diabetes type 2, controlled ([place]) 10/2016  . Diverticulosis   . Dysrhythmia     A flutter  . History of kidney stones   . Hypercholesterolemia   . Hypertension   . Psoriatic arthritis ([place])   . Sinusitis     Past Surgical History:  Procedure Laterality Date  . A-FLUTTER ABLATION N/A 10/17/2017   Procedure: A-FLUTTER ABLATION;  Surgeon: Constance Haw, MD;  Location: [place] [facility];  Service: Cardiovascular;  Laterality: N/A;  . COLON SURGERY  2001   2 feet removed re- diverticulosis  . CORONARY STENT PLACEMENT  2011   sequential bare-metal  . CYSTOSCOPY W/ URETERAL STENT PLACEMENT Bilateral 02/05/2020   Procedure: CYSTOSCOPY WITH RETROGRADE PYELOGRAM/URETERAL STENT PLACEMENT;  Surgeon: [name], MD;  Location[facility];  Service: Urology;  Laterality: Bilateral;  . INGUINAL HERNIA REPAIR    . OTHER SURGICAL HISTORY     appendectomy    Family History  Problem Relation Age of Onset  . Heart disease Mother    . Heart disease Father   . Heart disease Brother   . Diabetes Brother    Social History:  reports that he has never smoked. He has never used smokeless tobacco. He reports previous alcohol use. He reports that he does not use drugs.  Allergies:  Allergies  Allergen Reactions  . Ivp Dye [Iodinated Diagnostic Agents] Hives  . Lisinopril Other (See Comments)    cough  . Statins Other (See Comments)    myalgias  . Iodine Rash    No medications prior to admission.    No results found for this or any previous visit (from the past 48 hour(s)). No results found.  Review of Systems  There were no vitals taken for this visit. Physical Exam HENT:     Head: Normocephalic.     Nose: Nose normal.  Eyes:     Pupils: Pupils are equal, round, and reactive to light.  Cardiovascular:     Rate and Rhythm: Normal rate.  Pulmonary:     Effort: Pulmonary effort is normal.  Abdominal:     Comments: Stable large truncal obesity.   Genitourinary:    Comments: No CVAT at present.  Musculoskeletal:        General: Normal range of motion.     Cervical back: Normal range of motion.  Skin:    General: Skin is warm.  Neurological:     General: No  focal deficit present.     Mental Status: He is alert.      Assessment/Plan  Proceed as planned with BILATERAL ureteroscopic stone manipulation with goal of stone free. Risks, benefits, alternatives, expected peri-op course discussed previously and reiterated today.   [name], MD 03/02/2020, 7:55 AM

## 2020-03-02 NOTE — Op Note (Signed)
NAME: Shawn Hernandez, Shawn Hernandez MEDICAL RECORD [id number] ACCOUNT [id number] DATE OF BIRTH:1950-05-22 FACILITY: [facility] LOCATION: [facility] PHYSICIAN:Lynette Topete, MD  OPERATIVE REPORT  DATE OF PROCEDURE:  03/02/2020  PREOPERATIVE DIAGNOSES:  Left ureteral and right renal stones, history of acute renal failure.  PROCEDURES: 1.  Cystoscopy, bilateral retrograde pyelograms and interpretation. 2.  Bilateral ureteroscopy with laser lithotripsy. 3.  Exchange of bilateral ureteral stents, 5 x 26 Polaris with tether.  ESTIMATED BLOOD LOSS:  Nil.  COMPLICATIONS:  None.  SPECIMENS:  Right renal and left ureteral stone fragments for composition analysis.  FINDINGS: 1.  Multifocal right renal stone approximately total volume 11 mm2. 2.  Previous left ureteral stone, retrograde positioned into the upper pole kidney. 3.  Complete resolution of all accessible stone fragments larger than one-third mm following laser lithotripsy and basket extraction. 4.  Successful replacement of bilateral ureteral stents, proximal end in the renal pelvis, distal end in the urinary bladder.  INDICATIONS:  The patient is a 70 year old man who was found on workup of colicky flank pain and acute renal failure to have left ureteral and right renal stone last month.  He also had some questionable infectious parameters.  He underwent bilateral  ureteral stenting at that time to allow for renal decompression and clearance of any infection, and he now presents for definitive management with ureteroscopy.  Informed consent was obtained and placed in medical record.  DESCRIPTION OF PROCEDURE:  The patient being Khadar Monger identified, the procedure being bilateral ureteroscopic stone manipulation was confirmed.  Procedure timeout was performed.  Intravenous antibiotics were administered.  General LMA anesthesia  was induced.  The patient was placed into a low lithotomy position.  Sterile field was created, prepped and draped  base of penis, perineum, and proximal thighs using iodine.  Cystourethroscopy was performed using 21-French rigid cystoscope with offset  lens.  Inspection of anterior and posterior urethra was unremarkable.  Inspection of bladder revealed distal end of bilateral ureteral stents in situ.  There was some mild mucosal edema.  Distal end of the left stent was grasped, brought to the level of  the urethral meatus, and a 0.03 ZIPwire was advanced to lower pole and set aside as a safety wire.  Next, distal end of the right stent was grasped, brought to the level of the urethral meatus, and a 0.03 ZIPwire was advanced to lower pole and set aside  as a safety wire.  I then exchanged for open-ended catheter, and right retrograde pyelogram was obtained.  Right retrograde pyelogram demonstrated single right ureter, single system right kidney.  No filling defects or narrowing noted.  ZIPwire was once again exchanged.  Next, left retrograde pyelogram was obtained.  Left retrograde pyelogram demonstrated a single left ureter, single system left kidney.  No filling defects or narrowing noted.  Next, semirigid ureteroscopy was performed of the distal orifice of the left ureter alongside a separate sensor working wire.   No significant mucosal abnormalities were found.  Next, semirigid ureteroscopy was performed of distal four-fifths of the right ureter alongside a separate sensor working wire.  No mucosal abnormalities were found.  An 8-French feeding tube was placed  in the urinary bladder for pressure release, and semirigid scope was exchanged for a 12/14 medium length ureteral access sheath to the level of the proximal ureter on the right side using continuous fluoroscopic guidance and flexible digital ureteroscopy  was performed of the proximal ureter and systematic inspection of the right kidney including all calices x3.  There  were 2 foci of stone, 1 dominant papillary tip calcification right midpole estimated to be  approximately 6 mm and a larger lower pole  stone estimated approximately to be 9 mm or so.  The lower pole stone was grasped with Escape basket and positioned so that it lie next to the mid pole stone and holmium laser energy applied to stones using settings of 0.2 joules and 30 Hz and  approximately 60% of the stone was dusted and 40% fragmented with the fragments amenable to simple basketing with the Escape basket.  Following this, complete resolution of all accessible stone fragments larger than one-third mm on the right side.   Access sheath was removed under continuous vision.  No significant mucosal abnormalities were found.  Next, the access sheath was placed over the left sensor working wire to the level of the proximal left ureter using continuous fluoroscopic guidance and  flexible digital ureteroscopy was performed of the proximal left ureter and systematic inspection left kidney including all calices x3.  There was a single dominant calcification in the upper pole of the kidney, likely corresponding to the previously  retrograde positioned ureteral stone.  This also was much too large for simple basketing.  Holmium laser energy applied to stone using settings of 0.2 joules and 20 Hz. It was fragmented into 3 smaller pieces that were then amenable to basketing and  removed in their entirety.  Access sheath was removed under continuous vision.  No significant mucosal abnormalities were found.  Given the bilateral nature of the procedure today, it was felt that brief interval stenting with tethered stents would be  warranted.  As such, new 5 x 26 Polaris-type stents were placed over remaining safety wires bilaterally using fluoroscopic guidance.  Good proximal and distal planes were noted.  Tethers were left in place and fashioned to the dorsum of penis.  The  procedure was terminated.  The patient tolerated the procedure well.  No immediate perioperative complications.  The patient was taken to  postanesthesia care unit in stable condition.  Plan for discharge home.  IN/NUANCE  D:03/02/2020 T:03/02/2020 JOB:[id number]

## 2020-03-02 NOTE — Telephone Encounter (Signed)
       I went in pt's chart to see if pt was called back. 

## 2020-03-03 ENCOUNTER — Encounter (HOSPITAL_COMMUNITY): Payer: Self-pay | Admitting: Urology

## 2020-03-03 DIAGNOSIS — N202 Calculus of kidney with calculus of ureter: Secondary | ICD-10-CM | POA: Diagnosis not present

## 2020-03-18 ENCOUNTER — Telehealth: Payer: Self-pay | Admitting: Cardiology

## 2020-03-18 MED ORDER — MULTAQ 400 MG PO TABS: 400.0000 mg | ORAL_TABLET | Freq: Two times a day (BID) | ORAL | 3 refills | Status: DC

## 2020-03-18 NOTE — Telephone Encounter (Signed)
°*  STAT* If patient is at the pharmacy, call can be transferred to refill team.   1. Which medications need to be refilled? (please list name of each medication and dose if known)  A new prescription for Multaq  2. Which pharmacy/location (including street and city if local pharmacy) is medication to be sent to? [facility] [address] [place] [address]  3. Do they need a 30 day or 90 day supply?  90 days and refill

## 2020-03-18 NOTE — Addendum Note (Signed)
Addended by: Kathyrn Lass on: 03/18/2020 04:20 PM   Modules accepted: Orders

## 2020-03-18 NOTE — Telephone Encounter (Signed)
Spoke to patient he stated he needs 90 day refill for Multaq sent to his pharmacy.Refill sent to pharmacy.Stated [employer] patient assistance denied Mutaq due to diagnosis aflutter they will approve afib.

## 2020-03-18 NOTE — Telephone Encounter (Signed)
Spoke to patient see previous 03/18/20 telephone note.

## 2020-03-18 NOTE — Telephone Encounter (Signed)
New Message:    Please call, pt says he have a question for you.

## 2020-03-21 NOTE — Telephone Encounter (Signed)
Spoke to patient [employer] patient assistance form corrected with diagnosis Afib faxed to [employer] at fax # [phone].

## 2020-03-25 NOTE — Telephone Encounter (Signed)
Spoke to patient received a letter from [employer] patient assistance Robin Searing has been approved.

## 2020-04-04 ENCOUNTER — Telehealth: Payer: Self-pay

## 2020-04-04 NOTE — Telephone Encounter (Signed)
Patient assistance for Multaq 400 mg approved.Received Multaq 400 mg samples from [employer].Called patient samples left at front desk for pick up.

## 2020-04-12 DIAGNOSIS — E538 Deficiency of other specified B group vitamins: Secondary | ICD-10-CM | POA: Diagnosis not present

## 2020-04-12 DIAGNOSIS — E1165 Type 2 diabetes mellitus with hyperglycemia: Secondary | ICD-10-CM | POA: Diagnosis not present

## 2020-04-12 DIAGNOSIS — E782 Mixed hyperlipidemia: Secondary | ICD-10-CM | POA: Diagnosis not present

## 2020-04-12 DIAGNOSIS — I700 Atherosclerosis of aorta: Secondary | ICD-10-CM | POA: Diagnosis not present

## 2020-04-18 ENCOUNTER — Ambulatory Visit: Payer: PPO | Admitting: Cardiology

## 2020-04-18 ENCOUNTER — Other Ambulatory Visit: Payer: Self-pay

## 2020-04-18 ENCOUNTER — Telehealth: Payer: Self-pay | Admitting: Cardiology

## 2020-04-18 ENCOUNTER — Encounter: Payer: Self-pay | Admitting: Cardiology

## 2020-04-18 VITALS — BP 132/72 | HR 73 | Ht 72.0 in | Wt 233.0 lb

## 2020-04-18 DIAGNOSIS — R270 Ataxia, unspecified: Secondary | ICD-10-CM | POA: Diagnosis not present

## 2020-04-18 DIAGNOSIS — I480 Paroxysmal atrial fibrillation: Secondary | ICD-10-CM | POA: Diagnosis not present

## 2020-04-18 DIAGNOSIS — R531 Weakness: Secondary | ICD-10-CM | POA: Diagnosis not present

## 2020-04-18 DIAGNOSIS — E7800 Pure hypercholesterolemia, unspecified: Secondary | ICD-10-CM | POA: Diagnosis not present

## 2020-04-18 DIAGNOSIS — H8120 Vestibular neuronitis, unspecified ear: Secondary | ICD-10-CM | POA: Diagnosis not present

## 2020-04-18 DIAGNOSIS — N1831 Chronic kidney disease, stage 3a: Secondary | ICD-10-CM | POA: Diagnosis not present

## 2020-04-18 DIAGNOSIS — R42 Dizziness and giddiness: Secondary | ICD-10-CM

## 2020-04-18 DIAGNOSIS — I2510 Atherosclerotic heart disease of native coronary artery without angina pectoris: Secondary | ICD-10-CM | POA: Diagnosis not present

## 2020-04-18 DIAGNOSIS — I10 Essential (primary) hypertension: Secondary | ICD-10-CM | POA: Diagnosis not present

## 2020-04-18 DIAGNOSIS — I129 Hypertensive chronic kidney disease with stage 1 through stage 4 chronic kidney disease, or unspecified chronic kidney disease: Secondary | ICD-10-CM | POA: Diagnosis not present

## 2020-04-18 NOTE — Patient Instructions (Signed)
Stop taking Multaq

## 2020-04-18 NOTE — Telephone Encounter (Signed)
STAT if patient feels like he/she is going to faint   1) Are you dizzy now? Yes. Patient has been dizzy since he had his kidney stone removed. Patient is trying to get in with an ENT  2) Do you feel faint or have you passed out? no  3) Do you have any other symptoms? Patient says any time he coughs or sneezes he feels like his heart moves around in his chest   4) Have you checked your HR and BP (record if available)?  Patient just checked it and states it is ok.   Patient wanted to schedule a f/u appt with Dr. Martinique but no appointments were available until 06/15/20.

## 2020-04-18 NOTE — Telephone Encounter (Signed)
Spoke with the patient who states that he has continued to have dizziness and is planning to follow up with his PCP to get in with an ENT.  Patient states that he is congested right now and whenever he coughs or sneezes he feels that his heart is pounding against his chest. He states that he feels like his heart is moving. He denies any shortness of breath, chest pain or palpitations. He would like to schedule a follow up appointment.

## 2020-04-18 NOTE — Progress Notes (Signed)
Cardiology Office Note   Date:  04/18/2020   ID:  Shawn Hernandez, DOB Jul 26, 1950, MRN [id number]  PCP:  [name], MD  Cardiologist:   Peter Martinique, MD   Chief Complaint  Patient presents with  . Follow-up  . Palpitations  . Dizziness      History of Present Illness: Shawn Hernandez is a 70 y.o. male who I seen as a work in for evaluation of dizziness and palpitations. He has a history of coronary artery disease with 2 bare-metal stents to the OM in 2010 with residual 50% LAD and 70% RCA stenosis, hyperlipidemia with statin intolerance, psoriatic arthritis, obesity, paroxysmal atrial flutter, and diabetes.  He had an atrial flutter ablation on 10/18/2017. On follow up was noted to have paroxysmal Afib and was placed on Multaq.   He was admitted in July with episode of diverticulitis. Managed with antibiotics.   In January he had kidney stone extraction under anesthesia. Ever since then he has noted being off balance and having dizziness when he turns his head. Notes some pounding in his chest but no racing. He notes an intermittent tingle in his right anterior ribs.   Past Medical History:  Diagnosis Date  . Allergy to iodine   . Arthritis    discomfort in hands and arms  . Atrial flutter, paroxysmal ([place])   . Coronary artery disease   . Diabetes type 2, controlled ([place]) 10/2016  . Diverticulosis   . Dysrhythmia     A flutter  . History of kidney stones   . Hypercholesterolemia   . Hypertension   . Psoriatic arthritis ([place])   . Sinusitis     Past Surgical History:  Procedure Laterality Date  . A-FLUTTER ABLATION N/A 10/17/2017   Procedure: A-FLUTTER ABLATION;  Surgeon: Constance Haw, MD;  Location: [facility];  Service: Cardiovascular;  Laterality: N/A;  . COLON SURGERY  2001   2 feet removed re- diverticulosis  . CORONARY STENT PLACEMENT  2011   sequential bare-metal  . CYSTOSCOPY W/ URETERAL STENT PLACEMENT Bilateral 02/05/2020    Procedure: CYSTOSCOPY WITH RETROGRADE PYELOGRAM/URETERAL STENT PLACEMENT;  Surgeon: [name], MD;  Location: [facility];  Service: Urology;  Laterality: Bilateral;  . CYSTOSCOPY WITH RETROGRADE PYELOGRAM, URETEROSCOPY AND STENT PLACEMENT Bilateral 03/02/2020   Procedure: CYSTOSCOPY WITH RETROGRADE PYELOGRAM, URETEROSCOPY,AND STENT EXCHANGE;  Surgeon: [name], MD;  Location: [facility];  Service: Urology;  Laterality: Bilateral;  75 MINS  . HOLMIUM LASER APPLICATION Bilateral 0/97/9641   Procedure: HOLMIUM LASER APPLICATION;  Surgeon: [name], MD;  Location: [facility];  Service: Urology;  Laterality: Bilateral;  . INGUINAL HERNIA REPAIR    . OTHER SURGICAL HISTORY     appendectomy     Current Outpatient Medications  Medication Sig Dispense Refill  . apixaban (ELIQUIS) 5 MG TABS tablet Take 5 mg by mouth 2 (two) times daily.    . blood glucose meter kit and supplies Dispense based on patient and insurance preference. Use up to four times daily as directed. (FOR ICD-10 E11.10). 1 each 0  . cyanocobalamin (,VITAMIN B-12,) 1000 MCG/ML injection Inject 1,000 mcg into the muscle every 3 (three) months.    . diphenhydrAMINE (BENADRYL) 25 MG tablet Take 25 mg by mouth daily as needed for allergies.    . Dulaglutide 1.5 MG/0.5ML SOPN Inject 1.5 mg into the skin every Friday.    . ezetimibe (ZETIA) 10 MG tablet TAKE 1 TABLET BY MOUTH EVERY DAY (Patient taking differently:  Take 10 mg by mouth daily.) 90 tablet 0  . fenofibrate (TRICOR) 145 MG tablet Take 1 tablet (145 mg total) by mouth daily. 90 tablet 3  . glimepiride (AMARYL) 4 MG tablet Take 4 mg by mouth daily with breakfast.    . Magnesium 400 MG TABS Take 400 mg by mouth daily.    Marland Kitchen oxyCODONE-acetaminophen (PERCOCET) 5-325 MG tablet Take 1 tablet by mouth every 6 (six) hours as needed for moderate pain or severe pain. Post-operatively 15 tablet 0  . rosuvastatin (CRESTOR) 5 MG tablet Take 1 tablet (5 mg total) by mouth daily. 90 tablet 3   . senna-docusate (SENOKOT-S) 8.6-50 MG tablet Take 1 tablet by mouth 2 (two) times daily. While taking strong pain meds to prevent constipation 10 tablet 0   No current facility-administered medications for this visit.    Allergies:   Ivp dye [iodinated diagnostic agents], Lisinopril, Statins, and Iodine    Social History:  The patient  reports that he has never smoked. He has never used smokeless tobacco. He reports previous alcohol use. He reports that he does not use drugs.   Family History:  The patient's family history includes Diabetes in his brother; Heart disease in his brother, father, and mother.    ROS:  Please see the history of present illness.   Otherwise, review of systems are positive for none.   All other systems are reviewed and negative.    PHYSICAL EXAM: VS:  BP 132/72 (BP Location: Left Arm, Patient Position: Sitting, Cuff Size: Large)   Pulse 73   Ht 6' (1.829 m)   Wt 233 lb (105.7 kg)   BMI 31.60 kg/m  , BMI Body mass index is 31.6 kg/m. GEN: Well nourished, well developed, in no acute distress  HEENT: normal  Neck: no JVD, carotid bruits, or masses Cardiac: RRR; no murmurs, rubs, or gallops,no edema  Respiratory:  clear to auscultation bilaterally, normal work of breathing GI: soft, nontender, nondistended, + BS MS: no deformity or atrophy  Skin: warm and dry, no rash Neuro:  Strength and sensation are intact Psych: euthymic mood, full affect   EKG:  EKG is ordered today. The ekg ordered today demonstrates NSR with normal Ecg. I have personally reviewed and interpreted this study.    Recent Labs: 02/05/2020: ALT 21 02/07/2020: Magnesium 2.0 02/26/2020: BUN 17; Creatinine, Ser 1.80; Hemoglobin 13.8; Platelets 342; Potassium 4.2; Sodium 137    Lipid Panel    Component Value Date/Time   CHOL 173 10/20/2016 0226   TRIG 201 (H) 10/20/2016 0226   HDL 29 (L) 10/20/2016 0226   CHOLHDL 6.0 10/20/2016 0226   VLDL 40 10/20/2016 0226   LDLCALC 104 (H)  10/20/2016 0226   LDLDIRECT 107.6 08/10/2011 1136   Dated 07/14/19: cholesterol 175, triglycerides 201, HDL 28, LDL 112.  Dated 02/26/20: A1c 6.5%. creatinine 1.8. CBC, LFTs normal Dated 04/12/20 cholesterol 121, triglycerides 154, HDL 26, LDL 68.   Wt Readings from Last 3 Encounters:  04/18/20 233 lb (105.7 kg)  03/02/20 230 lb (104.3 kg)  02/26/20 230 lb (104.3 kg)      Other studies Reviewed: Additional studies/ records that were reviewed today include:  Cardiac event monitor 12/09/2017 Sinus rhythm PACs associated with palpitations Less than 1% atrial fibrillation, fastest heart rate 136 bpm Less than 1% PVCs Less than 1% PACs  Myocardial perfusion study 07/23/2017  The left ventricular ejection fraction is mildly decreased (45-54%).  Nuclear stress EF: 54%.  There was no ST segment  deviation noted during stress.  This is a low risk study.  1. EF 54%, low normal to mildly reduced.  2. No evidence for ischemia or infarction on perfusion images.   Low risk study.    ASSESSMENT AND PLAN:  1.  PAF- no recurrence that we can tell for the past 2.5 years.  Continue apixaban 5 mg tablet twice daily Recommend we stop Multaq at this point and monitor for recurrent symptoms. Hopefully he can do without.  Limit caffeine intake-educated about caffeinated drinks  2.  Coronary artery disease-s/p remote stenting of OM in 2010 with BMS.  Continue Zetia 10 mg daily Continue Crestor.  LDL at goal 68. Low-sodium heart healthy diet-increase fiber and   3.  Hyperlipidemia- LDL 68 Continue Zetia, fenofibrate and low dose Crestor.   4.  Essential hypertension-blood pressure is controlled Continue olmesartan 20 mg daily   5. CKD stage 3b  6. Imbalance/dizziness suspect more inner ear related. Will follow up with ENT. May benefit from vestibular exercises.   Current medicines are reviewed at length with the patient today.  The patient does not have concerns regarding  medicines.  The following changes have been made:  no change  Labs/ tests ordered today include:   No orders of the defined types were placed in this encounter.    Disposition:   FU with me in 6 months  Signed, Peter Martinique, MD  04/18/2020 2:37 PM    [facility] [address] [place], [place], [address] Phone [phone], Fax [phone]

## 2020-04-18 NOTE — Telephone Encounter (Signed)
Spoke to patient he stated since he had kidney stone removed he has been having dizziness and heart pounds.Advised Dr.Jordan has had a cancellation this afternoon.Appointment scheduled at 2:00 pm.

## 2020-04-19 ENCOUNTER — Other Ambulatory Visit: Payer: Self-pay | Admitting: Internal Medicine

## 2020-04-19 DIAGNOSIS — R270 Ataxia, unspecified: Secondary | ICD-10-CM

## 2020-04-19 DIAGNOSIS — R531 Weakness: Secondary | ICD-10-CM

## 2020-04-19 DIAGNOSIS — R42 Dizziness and giddiness: Secondary | ICD-10-CM

## 2020-04-19 DIAGNOSIS — H8120 Vestibular neuronitis, unspecified ear: Secondary | ICD-10-CM

## 2020-04-20 ENCOUNTER — Ambulatory Visit
Admission: RE | Admit: 2020-04-20 | Discharge: 2020-04-20 | Disposition: A | Discharge status: Home / Self Care | Payer: PPO | Source: Ambulatory Visit | Attending: Internal Medicine | Admitting: Internal Medicine

## 2020-04-20 ENCOUNTER — Other Ambulatory Visit: Payer: Self-pay

## 2020-04-20 DIAGNOSIS — I616 Nontraumatic intracerebral hemorrhage, multiple localized: Secondary | ICD-10-CM | POA: Diagnosis not present

## 2020-04-20 DIAGNOSIS — I6782 Cerebral ischemia: Secondary | ICD-10-CM | POA: Diagnosis not present

## 2020-04-20 DIAGNOSIS — R270 Ataxia, unspecified: Secondary | ICD-10-CM

## 2020-04-20 DIAGNOSIS — G9389 Other specified disorders of brain: Secondary | ICD-10-CM | POA: Diagnosis not present

## 2020-04-20 DIAGNOSIS — H8120 Vestibular neuronitis, unspecified ear: Secondary | ICD-10-CM

## 2020-04-20 DIAGNOSIS — I6381 Other cerebral infarction due to occlusion or stenosis of small artery: Secondary | ICD-10-CM | POA: Diagnosis not present

## 2020-04-20 DIAGNOSIS — R531 Weakness: Secondary | ICD-10-CM

## 2020-04-20 DIAGNOSIS — R42 Dizziness and giddiness: Secondary | ICD-10-CM

## 2020-05-04 ENCOUNTER — Other Ambulatory Visit: Payer: Self-pay | Admitting: Cardiology

## 2020-05-05 DIAGNOSIS — I2510 Atherosclerotic heart disease of native coronary artery without angina pectoris: Secondary | ICD-10-CM | POA: Diagnosis not present

## 2020-05-05 DIAGNOSIS — H8120 Vestibular neuronitis, unspecified ear: Secondary | ICD-10-CM | POA: Diagnosis not present

## 2020-05-05 DIAGNOSIS — E782 Mixed hyperlipidemia: Secondary | ICD-10-CM | POA: Diagnosis not present

## 2020-05-05 DIAGNOSIS — I700 Atherosclerosis of aorta: Secondary | ICD-10-CM | POA: Diagnosis not present

## 2020-05-05 DIAGNOSIS — N1832 Chronic kidney disease, stage 3b: Secondary | ICD-10-CM | POA: Diagnosis not present

## 2020-05-05 DIAGNOSIS — I480 Paroxysmal atrial fibrillation: Secondary | ICD-10-CM | POA: Diagnosis not present

## 2020-05-05 DIAGNOSIS — E1169 Type 2 diabetes mellitus with other specified complication: Secondary | ICD-10-CM | POA: Diagnosis not present

## 2020-06-03 ENCOUNTER — Telehealth: Payer: Self-pay | Admitting: Cardiology

## 2020-06-03 NOTE — Telephone Encounter (Signed)
Spoke to patient Eliquis 5 mg samples left a front desk.Stated he will bring patient assistance forms for Eliquis to office.

## 2020-06-03 NOTE — Telephone Encounter (Signed)
Patient calling the office for samples of medication:   1.  What medication and dosage are you requesting samples for? apixaban (ELIQUIS) 5 MG TABS tablet  2.  Are you currently out of this medication? No, has 2 tablets left, only enough for 1 day

## 2020-06-17 ENCOUNTER — Telehealth: Payer: Self-pay | Admitting: Cardiology

## 2020-06-17 NOTE — Telephone Encounter (Signed)
Patient calling to see if nurse can re fax application [phone] for the Eliquis. Patient is also asking the nurse to give him a call.

## 2020-06-20 ENCOUNTER — Other Ambulatory Visit: Payer: Self-pay

## 2020-06-20 MED ORDER — APIXABAN 5 MG PO TABS: 5.0000 mg | ORAL_TABLET | Freq: Two times a day (BID) | ORAL | 3 refills | Status: DC

## 2020-06-20 NOTE — Telephone Encounter (Signed)
Patient returning call.

## 2020-06-20 NOTE — Telephone Encounter (Signed)
Spoke to patient stated he wants to refax Eliquis patient assistance application.Advised a Eliquis patient assistance application was never sent in.Advised to complete his part and bring back to office.I will fax to patient assistance.

## 2020-06-20 NOTE — Addendum Note (Signed)
Addended by: Kathyrn Lass on: 06/20/2020 01:38 PM   Modules accepted: Orders

## 2020-06-20 NOTE — Telephone Encounter (Signed)
Called patient left message on personal voice mail to call me back. 

## 2020-06-21 MED ORDER — APIXABAN 5 MG PO TABS: 5.0000 mg | ORAL_TABLET | Freq: Two times a day (BID) | ORAL | 0 refills | Status: DC

## 2020-06-21 NOTE — Telephone Encounter (Signed)
Patient calling back requesting samples for the eliquis. He states he is out today.

## 2020-06-21 NOTE — Addendum Note (Signed)
Addended by: Raiford Simmonds on: 06/21/2020 09:28 AM   Modules accepted: Orders

## 2020-06-21 NOTE — Telephone Encounter (Signed)
SPOKE TO PATIENT  . INFORMED PATIENT SAMPLES ARE AVAILABLE FOR PICK . 2 WEEKS SUPPLY. PATIENT STATES HE WILL PICK SAMPLES UP TOMORROW HE LIVES IN [facility].  PATIENT STATES HE WILL BRING  PATIENT ASSISTANCE FORM FOR ELIQIUS TO THE OFFICE.

## 2020-06-22 DIAGNOSIS — R21 Rash and other nonspecific skin eruption: Secondary | ICD-10-CM | POA: Diagnosis not present

## 2020-06-22 DIAGNOSIS — L570 Actinic keratosis: Secondary | ICD-10-CM | POA: Diagnosis not present

## 2020-06-22 DIAGNOSIS — D0462 Carcinoma in situ of skin of left upper limb, including shoulder: Secondary | ICD-10-CM | POA: Diagnosis not present

## 2020-06-22 DIAGNOSIS — D2272 Melanocytic nevi of left lower limb, including hip: Secondary | ICD-10-CM | POA: Diagnosis not present

## 2020-06-22 DIAGNOSIS — L821 Other seborrheic keratosis: Secondary | ICD-10-CM | POA: Diagnosis not present

## 2020-06-22 DIAGNOSIS — D485 Neoplasm of uncertain behavior of skin: Secondary | ICD-10-CM | POA: Diagnosis not present

## 2020-06-22 DIAGNOSIS — D2271 Melanocytic nevi of right lower limb, including hip: Secondary | ICD-10-CM | POA: Diagnosis not present

## 2020-06-22 DIAGNOSIS — L218 Other seborrheic dermatitis: Secondary | ICD-10-CM | POA: Diagnosis not present

## 2020-06-22 NOTE — Telephone Encounter (Signed)
Patient assistance forms for Eliquis completed and faxed to [employer] at fax # [phone].

## 2020-07-01 ENCOUNTER — Telehealth: Payer: Self-pay

## 2020-07-01 NOTE — Telephone Encounter (Signed)
Received fax from [facility] patient assistance patient not eligible to receive Eliquis.

## 2020-07-08 ENCOUNTER — Telehealth: Payer: Self-pay | Admitting: Cardiology

## 2020-07-08 NOTE — Telephone Encounter (Signed)
Will need him to see pharmacist about switching  Keone Kamer Martinique MD, [facility]

## 2020-07-08 NOTE — Telephone Encounter (Signed)
Requesting to speak with cheryl having issues with Eliquis supply.  Did not specify any further to use dot phrase. States a callback can be given Monday if need be.

## 2020-07-08 NOTE — Telephone Encounter (Signed)
Spoke to patient stated he was denied patient assistance for Eliquis.Stated Eliquis too expensive.He would like to change to Coumadin.Advised I will send message to [place].

## 2020-07-11 NOTE — Telephone Encounter (Signed)
Called patient left message on personal voice mail Dr.Jordan's advice.Scheduler will call back with pharmacist appointment.

## 2020-07-14 ENCOUNTER — Telehealth: Payer: Self-pay | Admitting: *Deleted

## 2020-07-14 ENCOUNTER — Other Ambulatory Visit: Payer: Self-pay | Admitting: Cardiology

## 2020-07-14 MED ORDER — WARFARIN SODIUM 5 MG PO TABS: 5.0000 mg | ORAL_TABLET | Freq: Every evening | ORAL | 0 refills | Status: DC

## 2020-07-14 NOTE — Telephone Encounter (Signed)
Patient returned call. He has not started warfarin as it was never called into the pharmacy for him. He has a few tablets of Eliquis left. I advised that he start warfarin 5mg  daily tonight. He will finish out the Eliquis he has left. I have rescheduled him apt for today for Monday at 2:30. Offered his apt at [facility] since his provider is there, but he states he prefers [address] bc its closer to his house.

## 2020-07-14 NOTE — Telephone Encounter (Signed)
Pt scheduled for a new patient coumadin appointment. However, when reviewing chart it appears pt has not yet started warfarin and is still on Eliquis.  Per phone note from 6/3, Dr Martinique said for pt to see pharmacist about switching. Spoke to [employer] D, okay for pt to switch to Warfarin 5 mg daily with a 3 day overlap of Eliquis and can check INR next week. Attempted to call pt to talk about transitioning to warfarin, unable to get in touch with pt. LMOM.

## 2020-07-18 ENCOUNTER — Ambulatory Visit (INDEPENDENT_AMBULATORY_CARE_PROVIDER_SITE_OTHER): Payer: PPO

## 2020-07-18 ENCOUNTER — Other Ambulatory Visit: Payer: Self-pay

## 2020-07-18 DIAGNOSIS — Z5181 Encounter for therapeutic drug level monitoring: Secondary | ICD-10-CM | POA: Diagnosis not present

## 2020-07-18 DIAGNOSIS — I4892 Unspecified atrial flutter: Secondary | ICD-10-CM | POA: Diagnosis not present

## 2020-07-18 LAB — POCT INR: INR: 1.4 — AB (ref 2.0–3.0)

## 2020-07-18 NOTE — Progress Notes (Signed)
A full discussion of the nature of anticoagulants has been carried out.  A benefit risk analysis has been presented to the patient, so that they understand the justification for choosing anticoagulation at this time. The need for frequent and regular monitoring, precise dosage adjustment and compliance is stressed.  Side effects of potential bleeding are discussed.  The patient should avoid any OTC items containing aspirin or ibuprofen, and should avoid great swings in general diet.  Avoid alcohol consumption.  Call if any signs of abnormal bleeding.  Next PT/INR test in 1 week 

## 2020-07-18 NOTE — Patient Instructions (Signed)
-   START NEW DOSAGE of warfarin of 1 tablet every day EXCEPT 1.5 TABLETS ON MONDAYS, [place]. - Recheck INR in 1 week  Coumadin clinic [phone]

## 2020-07-21 ENCOUNTER — Telehealth: Payer: Self-pay | Admitting: Pharmacist

## 2020-07-21 NOTE — Telephone Encounter (Signed)
Pt called clinic stating he threw up Tuesday, missed his warfarin dose Wednesday, and threw up today. Takes warfarin in the evening, has been sick earlier in the day. Missing a dose can decrease his INR but N/V/D can increase his INR. Advised pt to continue with advised warfarin dosing and keep follow up INR check on Monday. He verbalized understanding.

## 2020-07-25 ENCOUNTER — Ambulatory Visit (INDEPENDENT_AMBULATORY_CARE_PROVIDER_SITE_OTHER): Payer: PPO

## 2020-07-25 ENCOUNTER — Other Ambulatory Visit: Payer: Self-pay

## 2020-07-25 DIAGNOSIS — R5383 Other fatigue: Secondary | ICD-10-CM | POA: Diagnosis not present

## 2020-07-25 DIAGNOSIS — I4892 Unspecified atrial flutter: Secondary | ICD-10-CM

## 2020-07-25 DIAGNOSIS — R39198 Other difficulties with micturition: Secondary | ICD-10-CM | POA: Diagnosis not present

## 2020-07-25 DIAGNOSIS — E1165 Type 2 diabetes mellitus with hyperglycemia: Secondary | ICD-10-CM | POA: Diagnosis not present

## 2020-07-25 DIAGNOSIS — Z5181 Encounter for therapeutic drug level monitoring: Secondary | ICD-10-CM

## 2020-07-25 DIAGNOSIS — I2510 Atherosclerotic heart disease of native coronary artery without angina pectoris: Secondary | ICD-10-CM | POA: Diagnosis not present

## 2020-07-25 DIAGNOSIS — E782 Mixed hyperlipidemia: Secondary | ICD-10-CM | POA: Diagnosis not present

## 2020-07-25 DIAGNOSIS — Z125 Encounter for screening for malignant neoplasm of prostate: Secondary | ICD-10-CM | POA: Diagnosis not present

## 2020-07-25 LAB — POCT INR: INR: 1.5 — AB (ref 2.0–3.0)

## 2020-07-25 NOTE — Patient Instructions (Signed)
-   take 2 tablets warfarin tonight, then  - START NEW DOSAGE of warfarin of 1.5 tablets every day EXCEPT 1 TABLET ON TUESDAYS AND THURSDAYS - Recheck INR in 1 week  Coumadin clinic [phone]

## 2020-08-02 ENCOUNTER — Ambulatory Visit (INDEPENDENT_AMBULATORY_CARE_PROVIDER_SITE_OTHER): Payer: PPO | Admitting: *Deleted

## 2020-08-02 ENCOUNTER — Other Ambulatory Visit: Payer: Self-pay

## 2020-08-02 DIAGNOSIS — L4050 Arthropathic psoriasis, unspecified: Secondary | ICD-10-CM | POA: Diagnosis not present

## 2020-08-02 DIAGNOSIS — I4892 Unspecified atrial flutter: Secondary | ICD-10-CM

## 2020-08-02 DIAGNOSIS — D692 Other nonthrombocytopenic purpura: Secondary | ICD-10-CM | POA: Diagnosis not present

## 2020-08-02 DIAGNOSIS — I480 Paroxysmal atrial fibrillation: Secondary | ICD-10-CM | POA: Diagnosis not present

## 2020-08-02 DIAGNOSIS — Z0000 Encounter for general adult medical examination without abnormal findings: Secondary | ICD-10-CM | POA: Diagnosis not present

## 2020-08-02 DIAGNOSIS — I129 Hypertensive chronic kidney disease with stage 1 through stage 4 chronic kidney disease, or unspecified chronic kidney disease: Secondary | ICD-10-CM | POA: Diagnosis not present

## 2020-08-02 DIAGNOSIS — Z5181 Encounter for therapeutic drug level monitoring: Secondary | ICD-10-CM | POA: Diagnosis not present

## 2020-08-02 DIAGNOSIS — N1832 Chronic kidney disease, stage 3b: Secondary | ICD-10-CM | POA: Diagnosis not present

## 2020-08-02 DIAGNOSIS — I2510 Atherosclerotic heart disease of native coronary artery without angina pectoris: Secondary | ICD-10-CM | POA: Diagnosis not present

## 2020-08-02 DIAGNOSIS — E1122 Type 2 diabetes mellitus with diabetic chronic kidney disease: Secondary | ICD-10-CM | POA: Diagnosis not present

## 2020-08-02 DIAGNOSIS — I700 Atherosclerosis of aorta: Secondary | ICD-10-CM | POA: Diagnosis not present

## 2020-08-02 DIAGNOSIS — E782 Mixed hyperlipidemia: Secondary | ICD-10-CM | POA: Diagnosis not present

## 2020-08-02 LAB — PROTIME-INR
INR: 8.2 (ref 0.9–1.2)
Prothrombin Time: 75.8 s — ABNORMAL HIGH (ref 9.1–12.0)

## 2020-08-02 LAB — POCT INR: INR: 6.5 — AB (ref 2.0–3.0)

## 2020-08-02 NOTE — Patient Instructions (Addendum)
Description   08/02/20-INR reported from [employer] of 8.2; Called pt and advised pt do not take any Warfarin today, No Warfarin tomorrow, No Warfarin Thursday, No Warfarin Friday then start taking Warfarin 1 tablet daily except 1.5 tablets on Monday, Wednesday, and Friday. Recheck INR on Tuesday. Coumadin Clinic [phone]

## 2020-08-03 DIAGNOSIS — R2681 Unsteadiness on feet: Secondary | ICD-10-CM | POA: Diagnosis not present

## 2020-08-03 DIAGNOSIS — R2689 Other abnormalities of gait and mobility: Secondary | ICD-10-CM | POA: Diagnosis not present

## 2020-08-03 DIAGNOSIS — H8190 Unspecified disorder of vestibular function, unspecified ear: Secondary | ICD-10-CM | POA: Diagnosis not present

## 2020-08-05 DIAGNOSIS — H8190 Unspecified disorder of vestibular function, unspecified ear: Secondary | ICD-10-CM | POA: Diagnosis not present

## 2020-08-05 DIAGNOSIS — R2689 Other abnormalities of gait and mobility: Secondary | ICD-10-CM | POA: Diagnosis not present

## 2020-08-05 DIAGNOSIS — R2681 Unsteadiness on feet: Secondary | ICD-10-CM | POA: Diagnosis not present

## 2020-08-09 ENCOUNTER — Ambulatory Visit (INDEPENDENT_AMBULATORY_CARE_PROVIDER_SITE_OTHER): Payer: PPO | Admitting: *Deleted

## 2020-08-09 ENCOUNTER — Other Ambulatory Visit: Payer: Self-pay

## 2020-08-09 DIAGNOSIS — I4892 Unspecified atrial flutter: Secondary | ICD-10-CM | POA: Diagnosis not present

## 2020-08-09 DIAGNOSIS — Z5181 Encounter for therapeutic drug level monitoring: Secondary | ICD-10-CM

## 2020-08-09 LAB — POCT INR: INR: 1.9 — AB (ref 2.0–3.0)

## 2020-08-09 NOTE — Patient Instructions (Signed)
Description   Today take 1.5 tablets then continue taking Warfarin 1 tablet daily except 1.5 tablets on Monday, Wednesday, and Friday. Recheck INR in 1 week. Coumadin Clinic [phone]

## 2020-08-10 DIAGNOSIS — R2689 Other abnormalities of gait and mobility: Secondary | ICD-10-CM | POA: Diagnosis not present

## 2020-08-10 DIAGNOSIS — R2681 Unsteadiness on feet: Secondary | ICD-10-CM | POA: Diagnosis not present

## 2020-08-10 DIAGNOSIS — H8190 Unspecified disorder of vestibular function, unspecified ear: Secondary | ICD-10-CM | POA: Diagnosis not present

## 2020-08-14 ENCOUNTER — Other Ambulatory Visit: Payer: Self-pay | Admitting: Cardiology

## 2020-08-15 ENCOUNTER — Other Ambulatory Visit: Payer: Self-pay

## 2020-08-15 ENCOUNTER — Other Ambulatory Visit: Payer: Self-pay | Admitting: Cardiology

## 2020-08-15 ENCOUNTER — Ambulatory Visit (INDEPENDENT_AMBULATORY_CARE_PROVIDER_SITE_OTHER): Payer: PPO | Admitting: *Deleted

## 2020-08-15 DIAGNOSIS — Z5181 Encounter for therapeutic drug level monitoring: Secondary | ICD-10-CM

## 2020-08-15 DIAGNOSIS — I4892 Unspecified atrial flutter: Secondary | ICD-10-CM

## 2020-08-15 LAB — POCT INR: INR: 6.3 — AB (ref 2.0–3.0)

## 2020-08-15 LAB — PROTIME-INR
INR: 7.6 (ref 0.9–1.2)
Prothrombin Time: 70.6 s — ABNORMAL HIGH (ref 9.1–12.0)

## 2020-08-15 NOTE — Patient Instructions (Signed)
Description   08/15/20-INR resulted as 7.6 per [employer]; Do not take any Warfarin today, No Warfarin Tuesday, No Warfarin Wednesday then start taking 1 tablet (5mg ) daily. Have some green leafy veggies today and keep consistent. Report to ER with bleeding, falls, or any accidents. Recheck INR in 1 week. Coumadin Clinic [phone]

## 2020-08-22 ENCOUNTER — Ambulatory Visit (INDEPENDENT_AMBULATORY_CARE_PROVIDER_SITE_OTHER): Payer: PPO

## 2020-08-22 ENCOUNTER — Other Ambulatory Visit: Payer: Self-pay

## 2020-08-22 DIAGNOSIS — Z5181 Encounter for therapeutic drug level monitoring: Secondary | ICD-10-CM | POA: Diagnosis not present

## 2020-08-22 DIAGNOSIS — I4892 Unspecified atrial flutter: Secondary | ICD-10-CM | POA: Diagnosis not present

## 2020-08-22 LAB — POCT INR: INR: 2.4 (ref 2.0–3.0)

## 2020-08-22 NOTE — Patient Instructions (Signed)
-   continue taking 1 tablet warfarin daily. Report to ER with bleeding, falls, or any accidents. - Recheck INR in 1 week.  Coumadin Clinic [phone]

## 2020-08-29 ENCOUNTER — Other Ambulatory Visit: Payer: Self-pay

## 2020-08-29 ENCOUNTER — Ambulatory Visit (INDEPENDENT_AMBULATORY_CARE_PROVIDER_SITE_OTHER): Payer: PPO

## 2020-08-29 DIAGNOSIS — Z5181 Encounter for therapeutic drug level monitoring: Secondary | ICD-10-CM | POA: Diagnosis not present

## 2020-08-29 DIAGNOSIS — I4892 Unspecified atrial flutter: Secondary | ICD-10-CM

## 2020-08-29 LAB — POCT INR: INR: 3.6 — AB (ref 2.0–3.0)

## 2020-08-29 NOTE — Patient Instructions (Signed)
Description   Skip today's dosage of Warfarin, then start taking 1 tablet warfarin daily except 1/2 tablet on Fridays.  Recheck INR in 1 week. Coumadin Clinic [phone]

## 2020-09-05 ENCOUNTER — Other Ambulatory Visit: Payer: Self-pay

## 2020-09-05 ENCOUNTER — Ambulatory Visit (INDEPENDENT_AMBULATORY_CARE_PROVIDER_SITE_OTHER): Payer: PPO

## 2020-09-05 DIAGNOSIS — I4892 Unspecified atrial flutter: Secondary | ICD-10-CM | POA: Diagnosis not present

## 2020-09-05 DIAGNOSIS — Z5181 Encounter for therapeutic drug level monitoring: Secondary | ICD-10-CM | POA: Diagnosis not present

## 2020-09-05 LAB — POCT INR: INR: 3.7 — AB (ref 2.0–3.0)

## 2020-09-05 NOTE — Patient Instructions (Signed)
Description   Skip today's dosage of Warfarin, then start taking 1 tablet warfarin daily except 1/2 tablet on Tuesday and Fridays.  Recheck INR in 1 week. Coumadin Clinic [phone]

## 2020-09-12 ENCOUNTER — Ambulatory Visit (INDEPENDENT_AMBULATORY_CARE_PROVIDER_SITE_OTHER): Payer: PPO

## 2020-09-12 ENCOUNTER — Other Ambulatory Visit: Payer: Self-pay

## 2020-09-12 DIAGNOSIS — I4892 Unspecified atrial flutter: Secondary | ICD-10-CM | POA: Diagnosis not present

## 2020-09-12 DIAGNOSIS — Z5181 Encounter for therapeutic drug level monitoring: Secondary | ICD-10-CM | POA: Diagnosis not present

## 2020-09-12 LAB — POCT INR: INR: 2.4 (ref 2.0–3.0)

## 2020-09-12 NOTE — Patient Instructions (Signed)
-   continue taking 1 tablet warfarin daily except 1/2 tablet on Tuesday and Fridays.   - Recheck INR in 2 weeks.  Coumadin Clinic [phone]

## 2020-09-21 ENCOUNTER — Other Ambulatory Visit: Payer: Self-pay | Admitting: Cardiology

## 2020-09-22 ENCOUNTER — Other Ambulatory Visit: Payer: Self-pay | Admitting: Cardiology

## 2020-09-22 ENCOUNTER — Telehealth: Payer: Self-pay | Admitting: Cardiology

## 2020-09-22 NOTE — Telephone Encounter (Signed)
Pt c/o medication issue:  1. Name of Medication:  Eliquis  2. How are you currently taking this medication (dosage and times per day)?   3. Are you having a reaction (difficulty breathing--STAT)?   4. What is your medication issue?   Patient states he is going back on Eliquis and he would like to know if Dr. Martinique has any special instructions on how he should take it.  Please advise.

## 2020-09-22 NOTE — Telephone Encounter (Signed)
Prescription refill request received for warfarin Lov: Martinique 04/18/2020 Next INR check: 8/22 Warfarin tablet strength: '5mg'$ 

## 2020-09-22 NOTE — Telephone Encounter (Signed)
Left message for patient to call back.    Will need to get approval from Dr Martinique to switch from coumadin to Eliquis.  RN ( would like to ask patient  has there been  change  because last time on Eliquis patient was able to obtain patient assistance for Eliquis)

## 2020-09-23 MED ORDER — WARFARIN SODIUM 5 MG PO TABS: ORAL_TABLET | ORAL | 0 refills | Status: DC

## 2020-09-23 NOTE — Telephone Encounter (Signed)
Needs pharm d review because I believe there is an overlap period of eliquis and warfarin. I am unsure but I will route to them

## 2020-09-23 NOTE — Telephone Encounter (Signed)
Called and spoke w/pt refill for warfarin sent in and instructed the pt to stay on warfarin and not to start the eliquis until we get an inr the pt voiced understanding

## 2020-09-23 NOTE — Telephone Encounter (Signed)
Spoke to patient he stated he received Eliquis from patient assistance.Stated he took his last dose of Coumadin and wants to start back on Eliquis.Advised I will send message to Coumadin clinic for advice.

## 2020-09-23 NOTE — Telephone Encounter (Signed)
We need to know what the patient's INR is in order to safely transition him. He will need to continue warfarin for now and come in for 1 last apt. We can move the apt up if he would like.

## 2020-09-26 ENCOUNTER — Ambulatory Visit (INDEPENDENT_AMBULATORY_CARE_PROVIDER_SITE_OTHER): Payer: PPO | Admitting: *Deleted

## 2020-09-26 ENCOUNTER — Other Ambulatory Visit: Payer: Self-pay

## 2020-09-26 DIAGNOSIS — Z5181 Encounter for therapeutic drug level monitoring: Secondary | ICD-10-CM | POA: Diagnosis not present

## 2020-09-26 DIAGNOSIS — I4892 Unspecified atrial flutter: Secondary | ICD-10-CM

## 2020-09-26 LAB — POCT INR: INR: 3.1 — AB (ref 2.0–3.0)

## 2020-09-26 NOTE — Patient Instructions (Addendum)
Description   Do not take any Warfarin today and No Warfarin tomorrow then start Eliquis '5mg'$  twice a day. Start your first dose on Tuesday night. Call with any questions [phone]

## 2020-09-30 ENCOUNTER — Telehealth: Payer: Self-pay | Admitting: Cardiology

## 2020-09-30 NOTE — Telephone Encounter (Signed)
Pt c/o medication issue:  1. Name of Medication: fenofibrate (TRICOR) 145 MG tablet  2. How are you currently taking this medication (dosage and times per day)? 1 tablet daily  3. Are you having a reaction (difficulty breathing--STAT)? no  4. What is your medication issue? [facility] clinical pharmacist calling to suggest a medication change. She states she saw the patient for chronic care management. She says he is currently on fenofibrate, but his GFR is 37. She says they do not want to exceed 54 mg of the medication in patients with GFR between 30-59. She states the patient also has a hard time swallowing the pill so the 54 mg tablets may also help with that. Phone: [phone]

## 2020-09-30 NOTE — Telephone Encounter (Signed)
Attempted to call: Olivia clinical pharmacist back and her office was now closed.. will close this encounter and send to the [facility] pt is due for an OV with Dr. Martinique and can discuss at his appt.

## 2020-10-05 DIAGNOSIS — E1122 Type 2 diabetes mellitus with diabetic chronic kidney disease: Secondary | ICD-10-CM | POA: Diagnosis not present

## 2020-10-05 DIAGNOSIS — E039 Hypothyroidism, unspecified: Secondary | ICD-10-CM | POA: Diagnosis not present

## 2020-10-05 DIAGNOSIS — N1832 Chronic kidney disease, stage 3b: Secondary | ICD-10-CM | POA: Diagnosis not present

## 2020-10-05 DIAGNOSIS — I129 Hypertensive chronic kidney disease with stage 1 through stage 4 chronic kidney disease, or unspecified chronic kidney disease: Secondary | ICD-10-CM | POA: Diagnosis not present

## 2020-10-26 ENCOUNTER — Ambulatory Visit (INDEPENDENT_AMBULATORY_CARE_PROVIDER_SITE_OTHER): Payer: PPO | Admitting: Orthopedic Surgery

## 2020-10-26 ENCOUNTER — Encounter: Payer: Self-pay | Admitting: Orthopedic Surgery

## 2020-10-26 ENCOUNTER — Other Ambulatory Visit: Payer: Self-pay

## 2020-10-26 VITALS — BP 157/87 | HR 80 | Ht 72.0 in | Wt 240.8 lb

## 2020-10-26 DIAGNOSIS — M65332 Trigger finger, left middle finger: Secondary | ICD-10-CM | POA: Diagnosis not present

## 2020-10-26 NOTE — Patient Instructions (Signed)

## 2020-10-26 NOTE — Progress Notes (Signed)
New Patient Visit  Assessment: Shawn Hernandez is a 70 y.o. male with the following: 1. Trigger finger, left middle finger  Plan: Patient's presentation is most consistent with a left long finger trigger finger.  We discussed the pathology in clinic.  I recommended we proceed with a steroid injection.  Depending on the resolution of his symptoms following the injection, we could consider a repeat injection versus surgical release of the A1 pulley.  All questions were answered and he is amenable to this plan.  Follow-up as needed.     Procedure note injection - Left Long finger A1 Pulley  Verbal consent was obtained to inject the Left Long finger A1 pulley Timeout was completed to confirm the site of injection.  The skin was prepped with alcohol and ethyl chloride was sprayed at the injection site.  A 21-gauge needle was used to inject 40 mg of Depo-Medrol and 1% lidocaine (1 cc) into the Left Long finger using a direct anterior approach.  There were no complications. Patient tolerated the procedure well. A sterile bandage was applied   Follow-up: Return if symptoms worsen or fail to improve.  Subjective:  Chief Complaint  Patient presents with   Hand Pain    Left middle finger     History of Present Illness: Shawn Hernandez is a 70 y.o. male who presents for evaluation of left long finger pain.  He states he has had pain and catching sensation in the left long finger for the past 3-4 months.  He denies a specific injury.  Catching is worse in the morning.  He is not sure what has caused it.  He is not sure what he can do to improve his symptoms.  He has never had an injection.  No previous surgeries.  He also has occasional pain in his right thumb, as well as some radiating pains proximally.  These pains are worsened with bowling.   Review of Systems: No fevers or chills No numbness or tingling No chest pain No shortness of breath No bowel or bladder dysfunction No GI  distress No headaches   Medical History:  Past Medical History:  Diagnosis Date   Allergy to iodine    Arthritis    discomfort in hands and arms   Atrial flutter, paroxysmal (HCC)    Coronary artery disease    Diabetes type 2, controlled ([place]) 10/2016   Diverticulosis    Dysrhythmia     A flutter   History of kidney stones    Hypercholesterolemia    Hypertension    Psoriatic arthritis ([place])    Sinusitis     Past Surgical History:  Procedure Laterality Date   A-FLUTTER ABLATION N/A 10/17/2017   Procedure: A-FLUTTER ABLATION;  Surgeon: Constance Haw, MD;  Location: [facility];  Service: Cardiovascular;  Laterality: N/A;   COLON SURGERY  2001   2 feet removed re- diverticulosis   CORONARY STENT PLACEMENT  2011   sequential bare-metal   CYSTOSCOPY W/ URETERAL STENT PLACEMENT Bilateral 02/05/2020   Procedure: CYSTOSCOPY WITH RETROGRADE PYELOGRAM/URETERAL STENT PLACEMENT;  Surgeon: [name], MD;  Location: [facility];  Service: Urology;  Laterality: Bilateral;   CYSTOSCOPY WITH RETROGRADE PYELOGRAM, URETEROSCOPY AND STENT PLACEMENT Bilateral 03/02/2020   Procedure: CYSTOSCOPY WITH RETROGRADE PYELOGRAM, URETEROSCOPY,AND STENT EXCHANGE;  Surgeon: [name], MD;  Location: [facility];  Service: Urology;  Laterality: Bilateral;  75 MINS   HOLMIUM LASER APPLICATION Bilateral 2/99/3716   Procedure: HOLMIUM LASER APPLICATION;  Surgeon: Tresa Moore,  Hubbard Robinson, MD;  Location: [facility];  Service: Urology;  Laterality: Bilateral;   INGUINAL HERNIA REPAIR     OTHER SURGICAL HISTORY     appendectomy    Family History  Problem Relation Age of Onset   Heart disease Mother    Heart disease Father    Heart disease Brother    Diabetes Brother    Social History   Tobacco Use   Smoking status: Never   Smokeless tobacco: Never  Vaping Use   Vaping Use: Never used  Substance Use Topics   Alcohol use: Not Currently   Drug use: Never    Allergies  Allergen Reactions   Ivp  Dye [Iodinated Diagnostic Agents] Hives   Lisinopril Other (See Comments)    cough   Statins Other (See Comments)    myalgias   Iodine Rash    Current Meds  Medication Sig   apixaban (ELIQUIS) 5 MG TABS tablet Take 5 mg by mouth 2 (two) times daily.   blood glucose meter kit and supplies Dispense based on patient and insurance preference. Use up to four times daily as directed. (FOR ICD-10 E11.10).   diphenhydrAMINE (BENADRYL) 25 MG tablet Take 25 mg by mouth daily as needed for allergies.   Dulaglutide 1.5 MG/0.5ML SOPN Inject 1.5 mg into the skin every Friday.   ezetimibe (ZETIA) 10 MG tablet TAKE 1 TABLET BY MOUTH EVERY DAY   fenofibrate (TRICOR) 145 MG tablet Take 1 tablet (145 mg total) by mouth daily.   glimepiride (AMARYL) 4 MG tablet Take 4 mg by mouth daily with breakfast.   Magnesium 400 MG TABS Take 400 mg by mouth daily.   rosuvastatin (CRESTOR) 5 MG tablet Take 1 tablet (5 mg total) by mouth daily.   senna-docusate (SENOKOT-S) 8.6-50 MG tablet Take 1 tablet by mouth 2 (two) times daily. While taking strong pain meds to prevent constipation    Objective: BP (!) 157/87   Pulse 80   Ht 6' (1.829 m)   Wt 240 lb 12.8 oz (109.2 kg)   BMI 32.66 kg/m   Physical Exam:  General: Alert and oriented. and No acute distress. Gait: Normal gait.  Evaluation left hand demonstrates no deformity.  Normal muscle bulk.  Fingers are warm and well-perfused.  2+ radial pulse.  Sensation is intact throughout the left hand.  He does have some mild tenderness to deep palpation over the A1 pulley to the long finger.  Active triggering is noted in clinic to the long finger.  IMAGING: No new imaging obtained today   New Medications:  No orders of the defined types were placed in this encounter.     Mordecai Rasmussen, MD  10/26/2020 11:52 AM

## 2020-11-11 ENCOUNTER — Other Ambulatory Visit: Payer: Self-pay | Admitting: Cardiology

## 2020-12-03 ENCOUNTER — Other Ambulatory Visit: Payer: Self-pay | Admitting: Cardiology

## 2020-12-05 ENCOUNTER — Telehealth: Payer: Self-pay | Admitting: Cardiology

## 2020-12-05 DIAGNOSIS — I129 Hypertensive chronic kidney disease with stage 1 through stage 4 chronic kidney disease, or unspecified chronic kidney disease: Secondary | ICD-10-CM | POA: Diagnosis not present

## 2020-12-05 DIAGNOSIS — E039 Hypothyroidism, unspecified: Secondary | ICD-10-CM | POA: Diagnosis not present

## 2020-12-05 DIAGNOSIS — N1832 Chronic kidney disease, stage 3b: Secondary | ICD-10-CM | POA: Diagnosis not present

## 2020-12-05 DIAGNOSIS — E1122 Type 2 diabetes mellitus with diabetic chronic kidney disease: Secondary | ICD-10-CM | POA: Diagnosis not present

## 2020-12-05 NOTE — Telephone Encounter (Signed)
Patient calling the office for samples of medication:   1.  What medication and dosage are you requesting samples for? MULTAQ 400MG   2.  Are you currently out of this medication? PT STATES HE CALLED THE MANUFACTURER [employer] THIS MORNING AND WAS ADVISED THAT DR Martinique HAVE TO OK THIS SCRIPT

## 2020-12-06 NOTE — Telephone Encounter (Signed)
° °  Pt is calling back to f/u °

## 2020-12-07 ENCOUNTER — Other Ambulatory Visit: Payer: Self-pay | Admitting: Cardiology

## 2020-12-07 NOTE — Telephone Encounter (Signed)
Patient is following up regarding his request for Multaq 400MG . Please contact patient with any updates.

## 2020-12-07 NOTE — Telephone Encounter (Signed)
Spoke with patient regarding his request for a refill on dronedarone (Multaq). Explained to patient that Dr. Martinique discontinued the medication on 04/18/20. The patient said,"It was up in the air, if I took the medication." I again explained that the medication was discontinued. The patient then stated he was off the medication, but after a few days, he felt palpitations and started taking the medication again. He also stated that he never called the office to report the palpitations or restarting the medication. He said that now [employer] needed a precertification for the medication. Explained to patient that I would forward the information to Dr. Martinique and wait for his response.

## 2020-12-07 NOTE — Telephone Encounter (Signed)
Pt is returning call to Judson Roch

## 2020-12-08 NOTE — Telephone Encounter (Signed)
It is OK to renew Multaq. We had stopped it because he hadn't had Afib in 2.5 years. If his symptoms are back OK to renew at prior dose.  Zariel Capano Martinique MD, [facility]

## 2020-12-12 ENCOUNTER — Telehealth: Payer: Self-pay | Admitting: Cardiology

## 2020-12-12 MED ORDER — MULTAQ 400 MG PO TABS: 400.0000 mg | ORAL_TABLET | Freq: Two times a day (BID) | ORAL | 6 refills | Status: DC

## 2020-12-12 NOTE — Telephone Encounter (Signed)
Spoke to patient he stated he only has 1 Multaq left.Stated he went to pick up prescription for 1 month supply was over $ 300.Stated he cannot afford.Advised I will mail a new patient assistance application to complete.Advised office is out of samples.Advised to call his pharmacy to see if you can buy 2 week supply.I will send message to [place] for advice on another medication.

## 2020-12-12 NOTE — Telephone Encounter (Signed)
Patient calling the office for samples of medication:   1.  What medication and dosage are you requesting samples for?  dronedarone (MULTAQ) 400 MG tablet  2.  Are you currently out of this medication? Yes

## 2020-12-12 NOTE — Telephone Encounter (Signed)
Patient calling the office for samples of medication:   1.  What medication and dosage are you requesting samples for?MULTAQ 400MG   2.  Are you currently out of this medication? yes

## 2020-12-12 NOTE — Telephone Encounter (Signed)
*  STAT* If patient is at the pharmacy, call can be transferred to refill team.   1. Which medications need to be refilled? (please list name of each medication and dose if known) MULTAQ 400MG   2. Which pharmacy/location (including street and city if local pharmacy) is medication to be sent to?  [id number] - [place],  - [place] also send a new script to [facility] fax it to [phone]  3. Do they need a 30 day or 90 day supply? 90 day

## 2020-12-12 NOTE — Telephone Encounter (Signed)
Spoke to patient office out of multaq samples.[facility] [place].Advised I will mail patient assistance forms for Eliquis and Multaq.Advised to complete and bring back to office along with proof of income.

## 2020-12-12 NOTE — Telephone Encounter (Signed)
Waiting on a response from Dr. Doug Sou nurse. Previous message sent. Spoke with pt and he is aware that he will receive a phone call back today. Documentation will be in previous phone note.

## 2020-12-13 ENCOUNTER — Other Ambulatory Visit: Payer: Self-pay

## 2020-12-13 MED ORDER — APIXABAN 5 MG PO TABS: 5.0000 mg | ORAL_TABLET | Freq: Two times a day (BID) | ORAL | 3 refills | Status: DC

## 2020-12-13 MED ORDER — MULTAQ 400 MG PO TABS: 400.0000 mg | ORAL_TABLET | Freq: Two times a day (BID) | ORAL | 3 refills | Status: DC

## 2020-12-13 NOTE — Telephone Encounter (Signed)
Spoke to patient Dr.Jordan's advice given.Appointment scheduled with [facility] 11/15 at 10:00 am.

## 2020-12-13 NOTE — Telephone Encounter (Signed)
[employer] , the manufacturer sent a re-order form via fax to Dr. Martinique and the form needs to be filled out and returned today. The patient still has one shipment available

## 2020-12-13 NOTE — Telephone Encounter (Signed)
He is not a candidate for 1C antiarrhythmics due to CAD. I think it would be best to have him evaluated in the AFib clinic to look at options again.  Lorissa Kishbaugh Martinique MD, [facility]

## 2020-12-13 NOTE — Telephone Encounter (Signed)
See previous 11/8 telephone note.

## 2020-12-13 NOTE — Telephone Encounter (Signed)
Multaq reorder form completed and faxed to [employer] at fax # [phone].

## 2020-12-16 NOTE — Telephone Encounter (Signed)
Spoke to patient we received Multaq 400 mg samples from [facility].Samples will be left at [facility] office front desk.

## 2020-12-20 ENCOUNTER — Encounter (HOSPITAL_COMMUNITY): Payer: Self-pay | Admitting: Physician Assistant

## 2020-12-20 ENCOUNTER — Ambulatory Visit (HOSPITAL_COMMUNITY)
Admission: RE | Admit: 2020-12-20 | Discharge: 2020-12-20 | Disposition: A | Discharge status: Home / Self Care | Payer: PPO | Source: Ambulatory Visit | Attending: Physician Assistant | Admitting: Physician Assistant

## 2020-12-20 ENCOUNTER — Other Ambulatory Visit: Payer: Self-pay

## 2020-12-20 VITALS — BP 136/84 | HR 75 | Ht 72.0 in | Wt 241.2 lb

## 2020-12-20 DIAGNOSIS — I2510 Atherosclerotic heart disease of native coronary artery without angina pectoris: Secondary | ICD-10-CM | POA: Diagnosis not present

## 2020-12-20 DIAGNOSIS — E118 Type 2 diabetes mellitus with unspecified complications: Secondary | ICD-10-CM | POA: Diagnosis not present

## 2020-12-20 DIAGNOSIS — I10 Essential (primary) hypertension: Secondary | ICD-10-CM | POA: Insufficient documentation

## 2020-12-20 DIAGNOSIS — Z6832 Body mass index (BMI) 32.0-32.9, adult: Secondary | ICD-10-CM | POA: Diagnosis not present

## 2020-12-20 DIAGNOSIS — I480 Paroxysmal atrial fibrillation: Secondary | ICD-10-CM | POA: Insufficient documentation

## 2020-12-20 DIAGNOSIS — E669 Obesity, unspecified: Secondary | ICD-10-CM | POA: Diagnosis not present

## 2020-12-20 DIAGNOSIS — D6869 Other thrombophilia: Secondary | ICD-10-CM | POA: Diagnosis not present

## 2020-12-20 DIAGNOSIS — I4892 Unspecified atrial flutter: Secondary | ICD-10-CM | POA: Diagnosis not present

## 2020-12-20 DIAGNOSIS — E785 Hyperlipidemia, unspecified: Secondary | ICD-10-CM | POA: Diagnosis not present

## 2020-12-20 NOTE — Progress Notes (Signed)
Primary Care Physician: [name], MD Primary Cardiologist: Dr Martinique Primary Electrophysiologist: Dr Curt Bears Referring Physician: Dr Lorain Childes is a 70 y.o. male with a history of coronary disease status post bare-metal stent x2 to the OM in 2010 with residual 50% LAD and 70% RCA, hyperlipidemia intolerant to statins, psoriatic arthritis, obesity, paroxysmal atrial flutter s/p flutter ablation 10/2017, paroxysmal atrial fibrillation, and diabetes who presents for follow up in the [facility]. He does note that he had some nausea when first starting the Multaq but this has improved. He denies significant snoring or alcohol use.  On follow up today, patient reports that he has done well from a cardiac standpoint. He did have a trial off Multaq but noted tachypalpitations and resumed the medication. He unfortunately has had issues with cost. Over the past month he has had some left subscapular pain while walking on the treadmill. No other associated symptoms.   Today, he denies symptoms of palpitations, chest pain, shortness of breath, orthopnea, PND, lower extremity edema, dizziness, presyncope, syncope, snoring, daytime somnolence, bleeding, or neurologic sequela. The patient is tolerating medications without difficulties and is otherwise without complaint today.    Atrial Fibrillation Risk Factors:  he does not have symptoms or diagnosis of sleep apnea. he does not have a history of rheumatic fever. he does not have a history of alcohol use. The patient does not have a history of early familial atrial fibrillation or other arrhythmias.  he has a BMI of Body mass index is 32.71 kg/m.Marland Kitchen Filed Weights   12/20/20 0958  Weight: 109.4 kg     Family History  Problem Relation Age of Onset   Heart disease Mother    Heart disease Father    Heart disease Brother    Diabetes Brother      Atrial Fibrillation Management  history:  Previous antiarrhythmic drugs: Multaq Previous cardioversions: none Previous ablations: 10/2017 (flutter) CHADS2VASC score: 3 Anticoagulation history: Eliquis   Past Medical History:  Diagnosis Date   Allergy to iodine    Arthritis    discomfort in hands and arms   Atrial flutter, paroxysmal ([place])    Coronary artery disease    Diabetes type 2, controlled ([place]) 10/2016   Diverticulosis    Dysrhythmia     A flutter   History of kidney stones    Hypercholesterolemia    Hypertension    Psoriatic arthritis ([place])    Sinusitis    Past Surgical History:  Procedure Laterality Date   A-FLUTTER ABLATION N/A 10/17/2017   Procedure: A-FLUTTER ABLATION;  Surgeon: Constance Haw, MD;  Location: [facility];  Service: Cardiovascular;  Laterality: N/A;   COLON SURGERY  2001   2 feet removed re- diverticulosis   CORONARY STENT PLACEMENT  2011   sequential bare-metal   CYSTOSCOPY W/ URETERAL STENT PLACEMENT Bilateral 02/05/2020   Procedure: CYSTOSCOPY WITH RETROGRADE PYELOGRAM/URETERAL STENT PLACEMENT;  Surgeon: [name], MD;  [facility]: [facility];  Service: Urology;  Laterality: Bilateral;   CYSTOSCOPY WITH RETROGRADE PYELOGRAM, URETEROSCOPY AND STENT PLACEMENT Bilateral 03/02/2020   Procedure: CYSTOSCOPY WITH RETROGRADE PYELOGRAM, URETEROSCOPY,AND STENT EXCHANGE;  Surgeon: [name], MD;  [facility]: [facility];  Service: Urology;  Laterality: Bilateral;  75 MINS   HOLMIUM LASER APPLICATION Bilateral 03/30/3610   Procedure: HOLMIUM LASER APPLICATION;  Surgeon: [name], MD;  [facility]: [facility];  Service: Urology;  Laterality: Bilateral;   INGUINAL HERNIA REPAIR     OTHER SURGICAL HISTORY  appendectomy    Current Outpatient Medications  Medication Sig Dispense Refill   apixaban (ELIQUIS) 5 MG TABS tablet Take 1 tablet (5 mg total) by mouth 2 (two) times daily. 180 tablet 3   blood glucose meter kit and supplies Dispense based on patient and insurance  preference. Use up to four times daily as directed. (FOR ICD-10 E11.10). 1 each 0   diphenhydrAMINE (BENADRYL) 25 MG tablet Take 25 mg by mouth daily as needed for allergies.     dronedarone (MULTAQ) 400 MG tablet Take 1 tablet (400 mg total) by mouth 2 (two) times daily with a meal. 180 tablet 3   Dulaglutide 1.5 MG/0.5ML SOPN Inject 1.5 mg into the skin every Friday.     ezetimibe (ZETIA) 10 MG tablet TAKE 1 TABLET BY MOUTH EVERY DAY 90 tablet 3   fenofibrate (TRICOR) 145 MG tablet TAKE 1 TABLET BY MOUTH EVERY DAY 90 tablet 3   glimepiride (AMARYL) 4 MG tablet Take 4 mg by mouth daily with breakfast.     Magnesium 400 MG TABS Take 400 mg by mouth daily.     oxyCODONE-acetaminophen (PERCOCET) 5-325 MG tablet Take 1 tablet by mouth every 6 (six) hours as needed for moderate pain or severe pain. Post-operatively 15 tablet 0   rosuvastatin (CRESTOR) 5 MG tablet TAKE 1 TABLET(5 MG) BY MOUTH DAILY 90 tablet 0   senna-docusate (SENOKOT-S) 8.6-50 MG tablet Take 1 tablet by mouth 2 (two) times daily. While taking strong pain meds to prevent constipation 10 tablet 0   No current facility-administered medications for this encounter.    Allergies  Allergen Reactions   Ivp Dye [Iodinated Diagnostic Agents] Hives   Lisinopril Other (See Comments)    cough   Statins Other (See Comments)    myalgias   Iodine Rash    Social History   Socioeconomic History   Marital status: Single    Spouse name: Not on file   Number of children: 0   Years of education: Not on file   Highest education level: Not on file  Occupational History    Employer: [employer]  Tobacco Use   Smoking status: Never   Smokeless tobacco: Never  Vaping Use   Vaping Use: Never used  Substance and Sexual Activity   Alcohol use: Not Currently   Drug use: Never   Sexual activity: Not on file  Other Topics Concern   Not on file  Social History Narrative   Not on file   Social Determinants of Health   Financial Resource  Strain: Not on file  Food Insecurity: Not on file  Transportation Needs: Not on file  Physical Activity: Not on file  Stress: Not on file  Social Connections: Not on file  Intimate Partner Violence: Not on file     ROS- All systems are reviewed and negative except as per the HPI above.  Physical Exam: Vitals:   12/20/20 0958  BP: 136/84  Pulse: 75  Weight: 109.4 kg  Height: 6' (1.829 m)   GEN- The patient is a well appearing obese male, alert and oriented x 3 today.   HEENT-head normocephalic, atraumatic, sclera clear, conjunctiva pink, hearing intact, trachea midline. Lungs- Clear to ausculation bilaterally, normal work of breathing Heart- Regular rate and rhythm, no murmurs, rubs or gallops  GI- soft, NT, ND, + BS Extremities- no clubbing, cyanosis, or edema MS- no significant deformity or atrophy Skin- no rash or lesion Psych- euthymic mood, full affect Neuro- strength and sensation are intact  Wt Readings from Last 3 Encounters:  12/20/20 109.4 kg  10/26/20 109.2 kg  04/18/20 105.7 kg    EKG today demonstrates  SR Vent. rate 75 BPM PR interval 196 ms QRS duration 82 ms QT/QTcB 374/417 ms   30 day monitor 01/17/18 Sinus rhythm PACs associated with palpitations Less than 1% atrial fibrillation, fastest heart rate 136 bpm Less than 1% PVCs Less than 1% PACs   Myoview 07/2017 The left ventricular ejection fraction is mildly decreased (45-54%). Nuclear stress EF: 54%. There was no ST segment deviation noted during stress. This is a low risk study.   Epic records are reviewed at length today  Assessment and Plan:  1. Paroxysmal atrial fibrillation/atrial flutter S/p atrial flutter ablation with Dr Curt Bears 10/2017. Multaq has worked well but patient would like to discuss other options. We discussed changing AAD to dofetilide or amiodarone. Would avoid class IC with h/o CAD. We also discussed afib ablation. At this time, he wishes to continue present  therapy. Information about dofetilide admission provided today. He also inquired about Watchman, patient information provided.  Continue Eliquis 5 mg BID  This patients CHA2DS2-VASc Score and unadjusted Ischemic Stroke Rate (% per year) is equal to 3.2 % stroke rate/year from a score of 3  Above score calculated as 1 point each if present [CHF, HTN, DM, Vascular=MI/PAD/Aortic Plaque, Age if 65-74, or Male] Above score calculated as 2 points each if present [Age > 75, or Stroke/TIA/TE]  2. CAD Patient having left subscapular discomfort while walking on the treadmill. Stress test in 2019 reassuring. Offered repeat stress test and patient declined at this time.   3. HTN Stable, no changes today.   Follow up with Dr Martinique. AF clinic as needed if he changes his mind about rhythm control options.    [facility] [address] [place], [place] [address] [phone] 12/20/2020 10:51 AM

## 2021-01-10 DIAGNOSIS — N1832 Chronic kidney disease, stage 3b: Secondary | ICD-10-CM | POA: Diagnosis not present

## 2021-01-10 DIAGNOSIS — E1122 Type 2 diabetes mellitus with diabetic chronic kidney disease: Secondary | ICD-10-CM | POA: Diagnosis not present

## 2021-01-10 DIAGNOSIS — E782 Mixed hyperlipidemia: Secondary | ICD-10-CM | POA: Diagnosis not present

## 2021-01-10 DIAGNOSIS — I129 Hypertensive chronic kidney disease with stage 1 through stage 4 chronic kidney disease, or unspecified chronic kidney disease: Secondary | ICD-10-CM | POA: Diagnosis not present

## 2021-01-17 DIAGNOSIS — I129 Hypertensive chronic kidney disease with stage 1 through stage 4 chronic kidney disease, or unspecified chronic kidney disease: Secondary | ICD-10-CM | POA: Diagnosis not present

## 2021-01-17 DIAGNOSIS — N1832 Chronic kidney disease, stage 3b: Secondary | ICD-10-CM | POA: Diagnosis not present

## 2021-01-17 DIAGNOSIS — H8120 Vestibular neuronitis, unspecified ear: Secondary | ICD-10-CM | POA: Diagnosis not present

## 2021-01-17 DIAGNOSIS — E782 Mixed hyperlipidemia: Secondary | ICD-10-CM | POA: Diagnosis not present

## 2021-01-17 DIAGNOSIS — E1122 Type 2 diabetes mellitus with diabetic chronic kidney disease: Secondary | ICD-10-CM | POA: Diagnosis not present

## 2021-02-23 ENCOUNTER — Telehealth: Payer: Self-pay | Admitting: Cardiology

## 2021-02-23 ENCOUNTER — Encounter: Payer: Self-pay | Admitting: *Deleted

## 2021-02-23 ENCOUNTER — Other Ambulatory Visit: Payer: Self-pay | Admitting: *Deleted

## 2021-02-23 NOTE — Telephone Encounter (Signed)
Pt c/o medication issue:  1. Name of Medication: dronedarone (MULTAQ) 400 MG tablet  2. How are you currently taking this medication (dosage and times per day)? 1 tablet twice daily  3. Are you having a reaction (difficulty breathing--STAT)? no  4. What is your medication issue? Patient calling for cheryl. He states [employer] said they have not received the application for multaq.

## 2021-02-23 NOTE — Telephone Encounter (Signed)
Spoke with patient to inform him that Shawn Hernandez will be back in the office on Monday to f/u with [employer] application. He had no other concerns at this time.

## 2021-02-24 NOTE — Telephone Encounter (Signed)
Returned call to patient left message on personal voice mail to call me back.

## 2021-02-27 ENCOUNTER — Other Ambulatory Visit: Payer: Self-pay

## 2021-02-27 MED ORDER — ROSUVASTATIN CALCIUM 5 MG PO TABS: 5.0000 mg | ORAL_TABLET | Freq: Every day | ORAL | 0 refills | Status: DC

## 2021-02-28 ENCOUNTER — Other Ambulatory Visit: Payer: Self-pay

## 2021-02-28 ENCOUNTER — Encounter: Payer: Self-pay | Admitting: Psychiatry

## 2021-02-28 ENCOUNTER — Telehealth: Payer: Self-pay | Admitting: Psychiatry

## 2021-02-28 ENCOUNTER — Ambulatory Visit (INDEPENDENT_AMBULATORY_CARE_PROVIDER_SITE_OTHER): Payer: PPO | Admitting: Psychiatry

## 2021-02-28 VITALS — BP 135/74 | HR 71 | Ht 72.0 in | Wt 240.0 lb

## 2021-02-28 DIAGNOSIS — I639 Cerebral infarction, unspecified: Secondary | ICD-10-CM | POA: Diagnosis not present

## 2021-02-28 DIAGNOSIS — R2689 Other abnormalities of gait and mobility: Secondary | ICD-10-CM | POA: Diagnosis not present

## 2021-02-28 NOTE — Progress Notes (Signed)
[facility]  PATIENT: Shawn Hernandez DOB: 28-Dec-1950  REFERRING CLINICIAN: [name], MD HISTORY FROM: self REASON FOR VISIT: dizziness   HISTORICAL  CHIEF COMPLAINT:  Chief Complaint  Patient presents with   Dizziness    RM 1 alone Pt is well, has been having trouble with imbalance and dizziness for over a yr.  Mentions it started after a kidney stone removal.     HISTORY OF PRESENT ILLNESS:  The patient presents for evaluation of dizziness which began in January 2022 following an operation for kidney stones. Had minimal dizziness before this, but it significantly worsened following his surgery. When he holds his head still he does not have symptoms. When he turns his head quickly it feels like images are trying to catch up with his eyes. Felt light-headed once or twice. Had one fall 6 months ago. Does not matter which way he turns his head. Feels he is leaning more to the left and if he leans too far forward he feels he may fall. Also experiences dizziness when he closes his eyes or is in low light. Denies room spinning. Has a lot of tension in his neck, but denies headaches. 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 Did vestibular therapy which only helped a little.  Has a history of Meniere's disease and was last seen by ENT around 2010. 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  MRI brain 04/20/20: chronic microhemorrhage in right cerebellum and chronic lacunar infarct in left lentiform nucleus  Takes Eliquis for afib and crestor/zetia for cholesterol.  OTHER MEDICAL CONDITIONS: afib, DM, HLD, Meniere's disease, vitamin B12 deficiency (gets injections), neuropathy   REVIEW OF SYSTEMS: Full 14 system review of systems performed and negative with exception of: dizziness  ALLERGIES: Allergies  Allergen Reactions   Ivp Dye [Iodinated Contrast Media] Hives   Lisinopril  Other (See Comments)    cough   Statins Other (See Comments)    myalgias   Iodine Rash    HOME MEDICATIONS: Outpatient Medications Prior to Visit  Medication Sig Dispense Refill   apixaban (ELIQUIS) 5 MG TABS tablet Take 1 tablet (5 mg total) by mouth 2 (two) times daily. 180 tablet 3   blood glucose meter kit and supplies Dispense based on patient and insurance preference. Use up to four times daily as directed. (FOR ICD-10 E11.10). 1 each 0   diphenhydrAMINE (BENADRYL) 25 MG tablet Take 25 mg by mouth daily as needed for allergies.     dronedarone (MULTAQ) 400 MG tablet Take 1 tablet (400 mg total) by mouth 2 (two) times daily with a meal. 180 tablet 3   Dulaglutide 1.5 MG/0.5ML SOPN Inject 1.5 mg into the skin every Friday.     ezetimibe (ZETIA) 10 MG tablet TAKE 1 TABLET BY MOUTH EVERY DAY 90 tablet 3   fenofibrate (TRICOR) 145 MG tablet TAKE 1 TABLET BY MOUTH EVERY DAY 90 tablet 3   glimepiride (AMARYL) 4 MG tablet Take 4 mg by mouth daily with breakfast.     Magnesium 400 MG TABS Take 400 mg by mouth daily.     rosuvastatin (CRESTOR) 5 MG tablet Take 1 tablet (5 mg total) by mouth daily. 90 tablet 0   TOUJEO SOLOSTAR 300 UNIT/ML Solostar Pen SMARTSIG:10 Unit(s) SUB-Q Daily     oxyCODONE-acetaminophen (PERCOCET) 5-325 MG tablet Take 1 tablet by mouth every 6 (six) hours as needed for moderate pain  or severe pain. Post-operatively 15 tablet 0   senna-docusate (SENOKOT-S) 8.6-50 MG tablet Take 1 tablet by mouth 2 (two) times daily. While taking strong pain meds to prevent constipation 10 tablet 0   No facility-administered medications prior to visit.    PAST MEDICAL HISTORY: Past Medical History:  Diagnosis Date   Allergy to iodine    Arthritis    discomfort in hands and arms   Atrial flutter, paroxysmal ([place])    Coronary artery disease    Diabetes type 2, controlled ([place]) 10/2016   Diverticulosis    Dysrhythmia     A flutter   History of kidney stones     Hypercholesterolemia    Hypertension    Psoriatic arthritis ([place])    Sinusitis    Vestibular neuronitis     PAST SURGICAL HISTORY: Past Surgical History:  Procedure Laterality Date   A-FLUTTER ABLATION N/A 10/17/2017   Procedure: A-FLUTTER ABLATION;  Surgeon: Constance Haw, MD;  Location: [facility];  Service: Cardiovascular;  Laterality: N/A;   COLON SURGERY  2001   2 feet removed re- diverticulosis   CORONARY STENT PLACEMENT  2011   sequential bare-metal   CYSTOSCOPY W/ URETERAL STENT PLACEMENT Bilateral 02/05/2020   Procedure: CYSTOSCOPY WITH RETROGRADE PYELOGRAM/URETERAL STENT PLACEMENT;  Surgeon: [name], MD;  Location: [facility];  Service: Urology;  Laterality: Bilateral;   CYSTOSCOPY WITH RETROGRADE PYELOGRAM, URETEROSCOPY AND STENT PLACEMENT Bilateral 03/02/2020   Procedure: CYSTOSCOPY WITH RETROGRADE PYELOGRAM, URETEROSCOPY,AND STENT EXCHANGE;  Surgeon: [name], MD;  Location: [facility];  Service: Urology;  Laterality: Bilateral;  75 MINS   HOLMIUM LASER APPLICATION Bilateral 6/50/3546   Procedure: HOLMIUM LASER APPLICATION;  Surgeon: [name], MD;  Location: [facility];  Service: Urology;  Laterality: Bilateral;   INGUINAL HERNIA REPAIR     OTHER SURGICAL HISTORY     appendectomy    FAMILY HISTORY: Family History  Problem Relation Age of Onset   Stroke Mother    Hypertension Mother    Heart disease Mother    Hypertension Father    Diabetes Father    Heart disease Father    Heart disease Brother    Diabetes Brother     SOCIAL HISTORY: Social History   Socioeconomic History   Marital status: Single    Spouse name: Not on file   Number of children: 5   Years of education: Not on file   Highest education level: Not on file  Occupational History    Employer: [employer]  Tobacco Use   Smoking status: Never   Smokeless tobacco: Never  Vaping Use   Vaping Use: Never used  Substance and Sexual Activity   Alcohol use: Not Currently   Drug  use: Never   Sexual activity: Not on file  Other Topics Concern   Not on file  Social History Narrative   Lives alone   Caffeine-1 coffee   Social Determinants of Health   Financial Resource Strain: Not on file  Food Insecurity: Not on file  Transportation Needs: Not on file  Physical Activity: Not on file  Stress: Not on file  Social Connections: Not on file  Intimate Partner Violence: Not on file     PHYSICAL EXAM  GENERAL EXAM/CONSTITUTIONAL: Vitals:  Vitals:   02/28/21 1118  BP: 135/74  Pulse: 71  Weight: 240 lb (108.9 kg)  Height: 6' (1.829 m)   Body mass index is 32.55 kg/m. Wt Readings from Last 3 Encounters:  02/28/21 240 lb (108.9 kg)  12/20/20 241 lb 3.2 oz (109.4 kg)  10/26/20 240 lb 12.8 oz (109.2 kg)   Patient is in no distress; well developed, nourished and groomed; neck is supple  CARDIOVASCULAR: Examination of peripheral vascular system by observation and palpation is normal  EYES: Pupils round and reactive to light, Visual fields full to confrontation, Extraocular movements intact  MUSCULOSKELETAL: Gait, strength, tone, movements noted in Neurologic exam below  NEUROLOGIC: MENTAL STATUS:  awake, alert, oriented to person, place and time recent and remote memory intact  CRANIAL NERVE:  2nd, 3rd, 4th, 6th - pupils equal and reactive to light, visual fields full to confrontation, extraocular muscles intact, no nystagmus 5th - facial sensation symmetric 7th - facial strength symmetric 8th - hearing intact 9th - palate elevates symmetrically, uvula midline 11th - shoulder shrug symmetric 12th - tongue protrusion midline  MOTOR:  normal bulk and tone, full strength in the BUE, BLE  SENSORY:  Diminished sensation to vibration in bilateral feet. +Romberg sway  COORDINATION:  finger-nose-finger, heel-to-shin intact  REFLEXES:  deep tendon reflexes present and symmetric  GAIT/STATION:  Normal stride length and arm swing. Unable to  perform tandem gait     DIAGNOSTIC DATA (LABS, IMAGING, TESTING) - I reviewed patient records, labs, notes, testing and imaging myself where available.  Lab Results  Component Value Date   WBC 8.2 02/26/2020   HGB 13.8 02/26/2020   HCT 41.0 02/26/2020   MCV 91.7 02/26/2020   PLT 342 02/26/2020      Component Value Date/Time   NA 137 02/26/2020 1338   K 4.2 02/26/2020 1338   CL 103 02/26/2020 1338   CO2 24 02/26/2020 1338   GLUCOSE 130 (H) 02/26/2020 1338   BUN 17 02/26/2020 1338   CREATININE 1.80 (H) 02/26/2020 1338   CREATININE 1.23 03/23/2015 1010   CALCIUM 9.6 02/26/2020 1338   PROT 7.3 02/05/2020 0905   ALBUMIN 3.4 (L) 02/07/2020 0511   AST 19 02/05/2020 0905   ALT 21 02/05/2020 0905   ALKPHOS 39 02/05/2020 0905   BILITOT 1.0 02/05/2020 0905   GFRNONAA 40 (L) 02/26/2020 1338   GFRAA 51 (L) 08/20/2019 0346   Lab Results  Component Value Date   CHOL 173 10/20/2016   HDL 29 (L) 10/20/2016   LDLCALC 104 (H) 10/20/2016   LDLDIRECT 107.6 08/10/2011   TRIG 201 (H) 10/20/2016   CHOLHDL 6.0 10/20/2016   Lab Results  Component Value Date   HGBA1C 6.5 (H) 02/06/2020   No results found for: VITAMINB12 Lab Results  Component Value Date   TSH 3.392 08/20/2018   B12: 307  MRI brain 04/20/20: T2 hyperintensities in the cerebral white matter bilaterally have mildly progressed and are nonspecific but compatible with mild chronic small vessel ischemic disease. A chronic microhemorrhage in the right cerebellar hemisphere is new. A chronic lacunar infarct in the left lentiform nucleus is also new.   ASSESSMENT AND PLAN  71 y.o. year old male with a history of afib, DM, HLD, Meniere's disease, vitamin B12 deficiency (gets injections), neuropathy who presents for evaluation of chronic dizziness. MRI with a remote lacunar infarct and chronic right cerebellar microhemorrhage. Reviewed findings with patient. Suspect that these are incidental findings. His vascular risk factors  are already optimized. Will order MRA brain to complete stroke workup. The patient's persisted dizziness is likely multifactorial. He has Meniere's disease which can contribute to dizziness and imbalance. Encouraged him to schedule an appointment with ENT for re-evaluation as he has not been seen by them  in over 10 years. His neuropathy is also likely contributing to his sense of imbalance. There may also be a component of persistent postural-perceptual dizziness. 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   1. Cerebrovascular accident (CVA), unspecified mechanism ([place])       PLAN: -MRA brain -He will schedule an appointment with ENT for Meniere's disease -next steps: consider SSRI and neck PT if testing unrevealing  Orders Placed This Encounter  Procedures   MR ANGIO HEAD WO CONTRAST    No orders of the defined types were placed in this encounter.   Return in about 6 months (around 08/28/2021).    Genia Harold, MD 02/28/21 12:34 PM  I spent an average of 62 minutes chart reviewing and counseling the patient, with at least 50% of the time face to face with the patient.   [facility] [address] [place], [place] [address] [phone]

## 2021-02-28 NOTE — Patient Instructions (Addendum)
MRA of the brain to look at blood vessels in the brain Make an appointment with ENT for Meniere's disease Consider physical therapy or massage for the neck

## 2021-02-28 NOTE — Telephone Encounter (Signed)
Spoke to patient patient assistance forms for Multaq faxed to [employer] at fax # [phone].Patient assistance forms for Eliquis faxed to [employer] at fax # [phone].

## 2021-02-28 NOTE — Telephone Encounter (Signed)
health team order sent to GI, [facility] they will reach out to the patient to schedule.

## 2021-03-02 ENCOUNTER — Other Ambulatory Visit: Payer: Self-pay

## 2021-03-02 ENCOUNTER — Ambulatory Visit
Admission: RE | Admit: 2021-03-02 | Discharge: 2021-03-02 | Disposition: A | Discharge status: Home / Self Care | Payer: PPO | Source: Ambulatory Visit | Attending: Psychiatry | Admitting: Psychiatry

## 2021-03-02 DIAGNOSIS — E1165 Type 2 diabetes mellitus with hyperglycemia: Secondary | ICD-10-CM | POA: Diagnosis not present

## 2021-03-02 DIAGNOSIS — I639 Cerebral infarction, unspecified: Secondary | ICD-10-CM

## 2021-03-02 DIAGNOSIS — N1832 Chronic kidney disease, stage 3b: Secondary | ICD-10-CM | POA: Diagnosis not present

## 2021-03-06 ENCOUNTER — Telehealth: Payer: Self-pay | Admitting: Psychiatry

## 2021-03-06 NOTE — Telephone Encounter (Signed)
Pt called in asking when he would be getting a call with his MRA results. Best call back # is [phone].

## 2021-03-06 NOTE — Telephone Encounter (Signed)
I called pt back and lvm asking for call.   Per Dr. Geanie Cooley my chart message: Shawn Hernandez,   MRA is normal without any significant narrowing of the blood vessels in your brain   Thanks, Dr. Billey Gosling  Phone staff can relay this message once pt calls back.

## 2021-03-09 ENCOUNTER — Telehealth: Payer: Self-pay | Admitting: Cardiology

## 2021-03-09 NOTE — Telephone Encounter (Signed)
Spoke to patient Shawn Hernandez patient assistance for Eliquis was faxed 02/28/21 and refaxed 03/07/21.Advised I will fax all paperwork again to fax # [phone].

## 2021-03-09 NOTE — Telephone Encounter (Signed)
New Message:    Patient said Shawn Hernandez sent Dr Martinique an application on [id number]. Patient said it need to be filled out and returned to [facility] please.

## 2021-03-14 NOTE — Telephone Encounter (Signed)
Routed to primary nurse to follow up

## 2021-03-14 NOTE — Telephone Encounter (Signed)
Paul from [employer] calling to inform the patient assistance form they received was the old version and it cannot be accepted. He states he will send the new version of the application to be filled out. If there are any questions he can be reached at: [phone].

## 2021-03-16 NOTE — Telephone Encounter (Signed)
Spoke to patient Multaq patient assistance paperwork re faxed yesterday 2/8.We received confirmation this morning application approved. Eliquis patient assistance paper work re faxed yesterday.We have not received confirmation yet.

## 2021-03-20 NOTE — Telephone Encounter (Signed)
Spoke to patient received fax from [facility] not eligible to receive patient assistance for Eliquis.

## 2021-03-23 ENCOUNTER — Telehealth: Payer: Self-pay

## 2021-03-23 ENCOUNTER — Other Ambulatory Visit: Payer: Self-pay

## 2021-03-23 MED ORDER — APIXABAN 5 MG PO TABS: 5.0000 mg | ORAL_TABLET | Freq: Two times a day (BID) | ORAL | 3 refills | Status: DC

## 2021-03-23 NOTE — Telephone Encounter (Signed)
Spoke to patient we received 90 day supply of Multaq 400 mg from [employer] patient assistance.Samples left at front desk.

## 2021-04-26 ENCOUNTER — Other Ambulatory Visit (HOSPITAL_COMMUNITY): Payer: Self-pay

## 2021-04-26 MED ORDER — TRULICITY 1.5 MG/0.5ML ~~LOC~~ SOAJ
1.5000 mg | SUBCUTANEOUS | 2 refills | Status: DC
  Filled 2021-04-26: qty 2, 28d supply, fill #0

## 2021-04-27 DIAGNOSIS — E1165 Type 2 diabetes mellitus with hyperglycemia: Secondary | ICD-10-CM | POA: Diagnosis not present

## 2021-04-27 DIAGNOSIS — N1832 Chronic kidney disease, stage 3b: Secondary | ICD-10-CM | POA: Diagnosis not present

## 2021-05-04 ENCOUNTER — Other Ambulatory Visit (HOSPITAL_COMMUNITY): Payer: Self-pay

## 2021-05-04 DIAGNOSIS — N1832 Chronic kidney disease, stage 3b: Secondary | ICD-10-CM | POA: Diagnosis not present

## 2021-05-04 DIAGNOSIS — E782 Mixed hyperlipidemia: Secondary | ICD-10-CM | POA: Diagnosis not present

## 2021-05-04 DIAGNOSIS — E1122 Type 2 diabetes mellitus with diabetic chronic kidney disease: Secondary | ICD-10-CM | POA: Diagnosis not present

## 2021-05-04 DIAGNOSIS — I129 Hypertensive chronic kidney disease with stage 1 through stage 4 chronic kidney disease, or unspecified chronic kidney disease: Secondary | ICD-10-CM | POA: Diagnosis not present

## 2021-05-04 DIAGNOSIS — I480 Paroxysmal atrial fibrillation: Secondary | ICD-10-CM | POA: Diagnosis not present

## 2021-05-04 DIAGNOSIS — K450 Other specified abdominal hernia with obstruction, without gangrene: Secondary | ICD-10-CM | POA: Diagnosis not present

## 2021-05-10 ENCOUNTER — Other Ambulatory Visit: Payer: Self-pay | Admitting: Cardiology

## 2021-05-18 ENCOUNTER — Telehealth: Payer: Self-pay | Admitting: Cardiology

## 2021-05-18 MED ORDER — EZETIMIBE 10 MG PO TABS: 10.0000 mg | ORAL_TABLET | Freq: Every day | ORAL | 1 refills | Status: DC

## 2021-05-18 NOTE — Telephone Encounter (Signed)
?*  STAT* If patient is at the pharmacy, call can be transferred to refill team. ? ? ?1. Which medications need to be refilled? (please list name of each medication and dose if known)  ?ezetimibe (ZETIA) 10 MG tablet       ? ?2. Which pharmacy/location (including street and city if local pharmacy) is medication to be sent to? ?[facility] [phone] - [place], [address] ? ?3. Do they need a 30 day or 90 day supply?  ? ?90 day supply. ?Patient states he has 2 tablets remaining and his appointment with Dr. Martinique is scheduled for 6/28. ? ?

## 2021-05-18 NOTE — Telephone Encounter (Signed)
Refill sent to pharmacy.   

## 2021-05-25 ENCOUNTER — Other Ambulatory Visit: Payer: Self-pay | Admitting: Cardiology

## 2021-06-07 DIAGNOSIS — Z794 Long term (current) use of insulin: Secondary | ICD-10-CM | POA: Diagnosis not present

## 2021-06-07 DIAGNOSIS — E782 Mixed hyperlipidemia: Secondary | ICD-10-CM | POA: Diagnosis not present

## 2021-06-07 DIAGNOSIS — Z8709 Personal history of other diseases of the respiratory system: Secondary | ICD-10-CM | POA: Diagnosis not present

## 2021-06-07 DIAGNOSIS — I480 Paroxysmal atrial fibrillation: Secondary | ICD-10-CM | POA: Diagnosis not present

## 2021-06-07 DIAGNOSIS — E1122 Type 2 diabetes mellitus with diabetic chronic kidney disease: Secondary | ICD-10-CM | POA: Diagnosis not present

## 2021-06-07 DIAGNOSIS — I129 Hypertensive chronic kidney disease with stage 1 through stage 4 chronic kidney disease, or unspecified chronic kidney disease: Secondary | ICD-10-CM | POA: Diagnosis not present

## 2021-06-17 ENCOUNTER — Other Ambulatory Visit: Payer: Self-pay | Admitting: Cardiology

## 2021-06-22 ENCOUNTER — Telehealth: Payer: Self-pay | Admitting: Cardiology

## 2021-06-22 NOTE — Telephone Encounter (Signed)
He has some questions whether he has experienced a small stroke or not.  He wants to know if he needs to come in or not.

## 2021-06-22 NOTE — Telephone Encounter (Signed)
Agree. Doesn't sound cardiac/vascular related.  Greyson Riccardi Martinique MD, [facility]

## 2021-06-22 NOTE — Telephone Encounter (Signed)
Spoke to patient he stated he had a severe cramping in back of left head that radiated down into left shoulder last night,lasted appox 15 to 20 sec.No slurred speech.No weakness.Stated back of left head and shoulder are sore this morning.Advised to see PCP.Dr.Jordan out of office today.I will make him aware.

## 2021-07-07 ENCOUNTER — Telehealth: Payer: Self-pay | Admitting: Cardiology

## 2021-07-07 NOTE — Telephone Encounter (Signed)
Pt c/o medication issue:  1. Name of Medication:   dronedarone (MULTAQ) 400 MG tablet    2. How are you currently taking this medication (dosage and times per day)?  Take 1 tablet (400 mg total) by mouth 2 (two) times daily with a meal 3. Are you having a reaction (difficulty breathing--STAT)?  No  4. What is your medication issue?  Pt states that he if calling to f/u to see if medication has come in yet. Pt states that he only has 3-4 days of medication left. Please advise

## 2021-07-07 NOTE — Telephone Encounter (Signed)
Contacted patient, he states that our office was suppose to receive his shipment from [employer] patient assistance- however, I advised with him I never knew of [place] getting medications here to the office before. I contacted patient assistance- who stated a "renewal" form should be sent out to receive his next shipment. I gave them fax number and will try to review what is needed.   I did call patient back and advised of this information- and I advised we had samples here for him to pick up next week until this was figured out.   Patient verbalized understanding, thankful for update.

## 2021-07-13 NOTE — Telephone Encounter (Signed)
Left message for pt to call.

## 2021-07-13 NOTE — Telephone Encounter (Signed)
Spoke with pt, he is needing a refill on his multaq. The paperwork was filled out and faxed to [employer].

## 2021-07-13 NOTE — Telephone Encounter (Signed)
Patient returning call.

## 2021-07-13 NOTE — Telephone Encounter (Signed)
Patient states he is returning a nurses call he just received today regarding  questions about this medication. Please advise.

## 2021-07-20 ENCOUNTER — Telehealth: Payer: Self-pay | Admitting: Cardiology

## 2021-07-20 NOTE — Telephone Encounter (Signed)
Pt returning call from Pharmacist. Pt states that if his medication can be left at the front desk he will be by to pick up either today or tomorrow. Please advise

## 2021-07-20 NOTE — Telephone Encounter (Signed)
Multaq patient assistance has come in.  Called patient and lmom

## 2021-07-20 NOTE — Telephone Encounter (Signed)
Medication is at the [facility] front desk

## 2021-07-27 ENCOUNTER — Telehealth: Payer: Self-pay

## 2021-07-28 ENCOUNTER — Ambulatory Visit (INDEPENDENT_AMBULATORY_CARE_PROVIDER_SITE_OTHER): Payer: PPO | Admitting: Orthopedic Surgery

## 2021-07-28 ENCOUNTER — Encounter: Payer: Self-pay | Admitting: Orthopedic Surgery

## 2021-07-28 DIAGNOSIS — M65332 Trigger finger, left middle finger: Secondary | ICD-10-CM | POA: Diagnosis not present

## 2021-07-30 NOTE — Progress Notes (Deleted)
Cardiology Office Note   Date:  07/30/2021   ID:  [name], DOB [birth year], MRN [id number]  PCP:  Shawn Seashore, MD  Cardiologist:   Shawn Zelada Martinique, MD   No chief complaint on file.     History of Present Illness: Shawn Hernandez is a 71 y.o. male who I seen as a work in for evaluation of dizziness and palpitations. He has a history of coronary artery disease with 2 bare-metal stents to the OM in 2010 with residual 50% LAD and 70% RCA stenosis, hyperlipidemia with statin intolerance, psoriatic arthritis, obesity, paroxysmal atrial flutter, and diabetes.  He had an atrial flutter ablation on 10/18/2017. On follow up was noted to have paroxysmal Afib and was placed on Multaq.   He was admitted in July with episode of diverticulitis. Managed with antibiotics.   In January 2022 he had kidney stone extraction under anesthesia. Ever since then he has noted being off balance and having dizziness when he turns his head. Notes some pounding in his chest but no racing. He notes an intermittent tingle in his right anterior ribs.   He was seen in Afib clinic in November. Discussion about alternative AAD therapy and ablation as well as Watchman device. Patient opted to continue current therapy.   Past Medical History:  Diagnosis Date   Allergy to iodine    Arthritis    discomfort in hands and arms   Atrial flutter, paroxysmal ([place])    Coronary artery disease    Diabetes type 2, controlled ([place]) 10/2016   Diverticulosis    Dysrhythmia     A flutter   History of kidney stones    Hypercholesterolemia    Hypertension    Psoriatic arthritis ([place])    Sinusitis    Vestibular neuronitis     Past Surgical History:  Procedure Laterality Date   A-FLUTTER ABLATION N/A 10/17/2017   Procedure: A-FLUTTER ABLATION;  Surgeon: Shawn Haw, MD;  Location: [facility];  Service: Cardiovascular;  Laterality: N/A;   COLON SURGERY  2001   2 feet removed re- diverticulosis    CORONARY STENT PLACEMENT  2011   sequential bare-metal   CYSTOSCOPY W/ URETERAL STENT PLACEMENT Bilateral 02/05/2020   Procedure: CYSTOSCOPY WITH RETROGRADE PYELOGRAM/URETERAL STENT PLACEMENT;  Surgeon: Shawn Frock, MD;  Location: [facility];  Service: Urology;  Laterality: Bilateral;   CYSTOSCOPY WITH RETROGRADE PYELOGRAM, URETEROSCOPY AND STENT PLACEMENT Bilateral 03/02/2020   Procedure: CYSTOSCOPY WITH RETROGRADE PYELOGRAM, URETEROSCOPY,AND STENT EXCHANGE;  Surgeon: Shawn Frock, MD;  [facility]: [facility];  Service: Urology;  Laterality: Bilateral;  75 MINS   HOLMIUM LASER APPLICATION Bilateral 4/80/1655   Procedure: HOLMIUM LASER APPLICATION;  Surgeon: Shawn Frock, MD;  [facility];  Service: Urology;  Laterality: Bilateral;   INGUINAL HERNIA REPAIR     OTHER SURGICAL HISTORY     appendectomy     Current Outpatient Medications  Medication Sig Dispense Refill   apixaban (ELIQUIS) 5 MG TABS tablet Take 1 tablet (5 mg total) by mouth 2 (two) times daily. 180 tablet 3   blood glucose meter kit and supplies Dispense based on patient and insurance preference. Use up to four times daily as directed. (FOR ICD-10 E11.10). 1 each 0   diphenhydrAMINE (BENADRYL) 25 MG tablet Take 25 mg by mouth daily as needed for allergies.     dronedarone (MULTAQ) 400 MG tablet Take 1 tablet (400 mg total) by mouth 2 (two) times daily with a meal. 180 tablet 3  Dulaglutide (TRULICITY) 1.5 KP/5.4SF SOPN Inject 1.5 mg into the skin once a week. 6 mL 2   Dulaglutide 1.5 MG/0.5ML SOPN Inject 1.5 mg into the skin every Friday.     ezetimibe (ZETIA) 10 MG tablet Take 1 tablet (10 mg total) by mouth daily. 90 tablet 1   fenofibrate (TRICOR) 145 MG tablet TAKE 1 TABLET BY MOUTH EVERY DAY 90 tablet 3   glimepiride (AMARYL) 4 MG tablet Take 4 mg by mouth daily with breakfast.     Magnesium 400 MG TABS Take 400 mg by mouth daily.     rosuvastatin (CRESTOR) 5 MG tablet TAKE 1 TABLET(5 MG) BY MOUTH DAILY 90 tablet 0    TOUJEO SOLOSTAR 300 UNIT/ML Solostar Pen SMARTSIG:10 Unit(s) SUB-Q Daily     No current facility-administered medications for this visit.    Allergies:   Ivp dye [iodinated contrast media], Lisinopril, Statins, and Iodine    Social History:  The patient  reports that he has never smoked. He has never used smokeless tobacco. He reports that he does not currently use alcohol. He reports that he does not use drugs.   Family History:  The patient's family history includes Diabetes in his brother and father; Heart disease in his brother, father, and mother; Hypertension in his father and mother; Stroke in his mother.    ROS:  Please see the history of present illness.   Otherwise, review of systems are positive for none.   All other systems are reviewed and negative.    PHYSICAL EXAM: VS:  There were no vitals taken for this visit. , BMI There is no height or weight on file to calculate BMI. GEN: Well nourished, well developed, in no acute distress  HEENT: normal  Neck: no JVD, carotid bruits, or masses Cardiac: RRR; no murmurs, rubs, or gallops,no edema  Respiratory:  clear to auscultation bilaterally, normal work of breathing GI: soft, nontender, nondistended, + BS MS: no deformity or atrophy  Skin: warm and dry, no rash Neuro:  Strength and sensation are intact Psych: euthymic mood, full affect   EKG:  EKG is ordered today. The ekg ordered today demonstrates NSR with normal Ecg. I have personally reviewed and interpreted this study.    Recent Labs: No results found for requested labs within last 365 days.    Lipid Panel    Component Value Date/Time   CHOL 173 10/20/2016 0226   TRIG 201 (H) 10/20/2016 0226   HDL 29 (L) 10/20/2016 0226   CHOLHDL 6.0 10/20/2016 0226   VLDL 40 10/20/2016 0226   LDLCALC 104 (H) 10/20/2016 0226   LDLDIRECT 107.6 08/10/2011 1136   Dated 07/14/19: cholesterol 175, triglycerides 201, HDL 28, LDL 112.  Dated 02/26/20: A1c 6.5%. creatinine 1.8.  CBC, LFTs normal Dated 04/12/20 cholesterol 121, triglycerides 154, HDL 26, LDL 68.   Wt Readings from Last 3 Encounters:  02/28/21 240 lb (108.9 kg)  12/20/20 241 lb 3.2 oz (109.4 kg)  10/26/20 240 lb 12.8 oz (109.2 kg)      Other studies Reviewed: Additional studies/ records that were reviewed today include:  Cardiac event monitor 12/09/2017 Sinus rhythm PACs associated with palpitations Less than 1% atrial fibrillation, fastest heart rate 136 bpm Less than 1% PVCs Less than 1% PACs   Myocardial perfusion study 07/23/2017 The left ventricular ejection fraction is mildly decreased (45-54%). Nuclear stress EF: 54%. There was no ST segment deviation noted during stress. This is a low risk study.   1. EF 54%, low  normal to mildly reduced.  2. No evidence for ischemia or infarction on perfusion images.    Low risk study.    ASSESSMENT AND PLAN:  1.  PAF- no recurrence that we can tell for the past 2.5 years.  Continue apixaban 5 mg tablet twice daily Recommend we stop Multaq at this point and monitor for recurrent symptoms. Hopefully he can do without.  Limit caffeine intake-educated about caffeinated drinks   2.  Coronary artery disease-s/p remote stenting of OM in 2010 with BMS.  Continue Zetia 10 mg daily Continue Crestor.  LDL at goal 68. Low-sodium heart healthy diet-increase fiber and    3.  Hyperlipidemia- LDL 68 Continue Zetia, fenofibrate and low dose Crestor.    4.  Essential hypertension-blood pressure is controlled Continue olmesartan 20 mg daily    5. CKD stage 3b  6. Imbalance/dizziness suspect more inner ear related. Will follow up with ENT. May benefit from vestibular exercises.   Current medicines are reviewed at length with the patient today.  The patient does not have concerns regarding medicines.  The following changes have been made:  no change  Labs/ tests ordered today include:   No orders of the defined types were placed in this  encounter.    Disposition:   FU with me in 6 months  Signed, Ayce Pietrzyk Martinique, MD  07/30/2021 1:26 PM    [facility] [address] [place], [place], [address] Phone [phone], Fax [phone]

## 2021-08-01 DIAGNOSIS — I129 Hypertensive chronic kidney disease with stage 1 through stage 4 chronic kidney disease, or unspecified chronic kidney disease: Secondary | ICD-10-CM | POA: Diagnosis not present

## 2021-08-01 DIAGNOSIS — I480 Paroxysmal atrial fibrillation: Secondary | ICD-10-CM | POA: Diagnosis not present

## 2021-08-01 DIAGNOSIS — Z8709 Personal history of other diseases of the respiratory system: Secondary | ICD-10-CM | POA: Diagnosis not present

## 2021-08-01 DIAGNOSIS — E1122 Type 2 diabetes mellitus with diabetic chronic kidney disease: Secondary | ICD-10-CM | POA: Diagnosis not present

## 2021-08-01 DIAGNOSIS — E1165 Type 2 diabetes mellitus with hyperglycemia: Secondary | ICD-10-CM | POA: Diagnosis not present

## 2021-08-01 DIAGNOSIS — E782 Mixed hyperlipidemia: Secondary | ICD-10-CM | POA: Diagnosis not present

## 2021-08-01 DIAGNOSIS — I639 Cerebral infarction, unspecified: Secondary | ICD-10-CM | POA: Diagnosis not present

## 2021-08-01 DIAGNOSIS — Z794 Long term (current) use of insulin: Secondary | ICD-10-CM | POA: Diagnosis not present

## 2021-08-02 ENCOUNTER — Ambulatory Visit: Payer: PPO | Admitting: Cardiology

## 2021-08-03 ENCOUNTER — Telehealth: Payer: Self-pay

## 2021-08-03 ENCOUNTER — Telehealth: Payer: Self-pay | Admitting: Cardiology

## 2021-08-03 NOTE — Telephone Encounter (Signed)
Spoke with pt, he is needing clearance for upcoming orthopaedic surgery. Patient made aware he will need the surgeon to fax [place] a clearance form. Our fax number given to the patient. He also reports he had an appointment yesterday that was cx due to dr Martinique being sick. He is asking when he might be rescheduled. Patient aware will forward to dr Martinique nurse to help with getting him rescheduled.

## 2021-08-03 NOTE — Telephone Encounter (Signed)
Called patient left message on personal voice mail to call me back to schedule follow up appointment with Dr.Jordan.

## 2021-08-03 NOTE — Telephone Encounter (Signed)
Patient called asking our office to send a release form for his Eliquis to Dr.Jordan prior to his surgery. He gave the fax number to Dr. Doug Sou office [phone].

## 2021-08-03 NOTE — Telephone Encounter (Signed)
Calling to see if the form was sent to his other Dr. Please advise

## 2021-08-04 NOTE — Telephone Encounter (Signed)
Original letter sent in [place] 07/28/21, letter sent again by fax today as requested.

## 2021-08-07 ENCOUNTER — Telehealth: Payer: Self-pay

## 2021-08-07 NOTE — Telephone Encounter (Signed)
   Pre-operative Risk Assessment    Patient Name: Shawn Hernandez  DOB: 11-06-1950 MRN: [id number]      Request for Surgical Clearance    Procedure:   Left Long Finger Trigger Release  Date of Surgery:  Clearance TBD                                 Surgeon:  Larena Glassman, MD Surgeon's Group or Practice Name:  [facility] Phone number:  [phone] Fax number:  [phone]   Type of Clearance Requested:   - Medical    Type of Anesthesia:  General    Additional requests/questions:  Please advise surgeon/provider what medications should be held.  Signed, Elsie Lincoln Delyla Sandeen   08/07/2021, 2:28 PM

## 2021-08-09 NOTE — Telephone Encounter (Signed)
Pt overdue for follow up, has not been seen since 04/2020. Also overdue for labs, requires BMET and CBC before clearance can be provided.

## 2021-08-09 NOTE — Telephone Encounter (Signed)
   Patient Name: Shawn Hernandez  DOB: June 03, 1950 MRN: [id number]  Primary Cardiologist: Peter Martinique, MD  Chart reviewed as part of pre-operative protocol coverage.   Per pharmacy recommendations, patient will require updated labs including CBC and BMET prior to to completing surgical clearance and recommendations for holding Eliquis prior to surgery.   Preoperative team, please schedule patient for updated labs. Following lab results, patient will also require a tele visit for formal clearance.   Thank you.    Lenna Sciara, NP 08/09/2021, 2:34 PM

## 2021-08-09 NOTE — Telephone Encounter (Signed)
Pt has appt 08/10/21 with Almyra Deforest, PAC for pre op clearance. Will add need labs to appt notes

## 2021-08-10 ENCOUNTER — Ambulatory Visit (INDEPENDENT_AMBULATORY_CARE_PROVIDER_SITE_OTHER): Payer: PPO | Admitting: Physician Assistant

## 2021-08-10 ENCOUNTER — Encounter: Payer: Self-pay | Admitting: Physician Assistant

## 2021-08-10 VITALS — BP 128/74 | HR 61 | Ht 72.0 in | Wt 231.0 lb

## 2021-08-10 DIAGNOSIS — E785 Hyperlipidemia, unspecified: Secondary | ICD-10-CM

## 2021-08-10 DIAGNOSIS — I10 Essential (primary) hypertension: Secondary | ICD-10-CM

## 2021-08-10 DIAGNOSIS — E119 Type 2 diabetes mellitus without complications: Secondary | ICD-10-CM

## 2021-08-10 DIAGNOSIS — Z79899 Other long term (current) drug therapy: Secondary | ICD-10-CM

## 2021-08-10 DIAGNOSIS — I2510 Atherosclerotic heart disease of native coronary artery without angina pectoris: Secondary | ICD-10-CM

## 2021-08-10 DIAGNOSIS — Z794 Long term (current) use of insulin: Secondary | ICD-10-CM | POA: Diagnosis not present

## 2021-08-10 DIAGNOSIS — Z01810 Encounter for preprocedural cardiovascular examination: Secondary | ICD-10-CM | POA: Diagnosis not present

## 2021-08-10 DIAGNOSIS — I4892 Unspecified atrial flutter: Secondary | ICD-10-CM | POA: Diagnosis not present

## 2021-08-10 NOTE — Progress Notes (Unsigned)
Cardiology Office Note:    Date:  08/12/2021   ID:  Shawn Hernandez, DOB [birth year], MRN [id number]  PCP:  [name], [place] [facility] Cardiologist:  Peter Martinique, MD Electrophysiologist:  Constance Haw, MD     Referring MD: [name], MD   Chief Complaint  Patient presents with   Follow-up   Pre-op Exam    Upcoming trigger finger release surgery    History of Present Illness:    Shawn Hernandez is a 71 y.o. male with a hx of CAD, hypertension, hyperlipidemia, DM2, atrial flutter and history of psoriatic arthritis.  Patient was previously seen by Dr. Peter Martinique in March 2022 for evaluation of dizziness and palpitation.  Prior to that, patient had a bare-metal stent to OM in 2010 with residual 50% LAD and a 70% RCA lesion.  He is intolerant of statins.  He had atrial flutter ablation in September 2019, on follow-up he had paroxysmal atrial fibrillation and was placed on Multaq.  He had a kidney stone extraction and was feeling dizzy and had imbalance afterward.  He was seen by Dr. Martinique in March 2022 who suspected imbalance issue is more related to inner ear issue.  He is Multaq was also discontinued during the visit in March 2022 given lack of recurrence for A-fib in over 2 years.  He unfortunately had tachypalpitations after coming off of Multaq, and he decided to resume the medication.  He was last seen by A-fib clinic in November 2022 at which time he was doing well.  He was considering switching to a different antiarrhythmic therapy.  During the last office visit in the A-fib clinic in November, it was discussed with the patient possible dofetilide versus amiodarone.  He eventually decided to continue on Multaq.  He also inquired about a Watchman device.  Patient presents today for follow-up.  He has upcoming trigger finger release surgery with general anesthesia.  He denies any recent exertional chest pain or worsening dyspnea.  He has  not had any recurrence of A-fib in almost a year.  He is still taking Eliquis.  He has no problem accomplishing more than 4 METS of activity.  He may proceed with surgery.  He will need a basic metabolic panel and a CBC for our clinical pharmacist to decide on the holding time for Eliquis.  I will call him tomorrow after his lab work come back and I did discuss with our clinical pharmacist.  Past Medical History:  Diagnosis Date   Allergy to iodine    Arthritis    discomfort in hands and arms   Atrial flutter, paroxysmal ([place])    Coronary artery disease    Diabetes type 2, controlled ([place]) 10/2016   Diverticulosis    Dysrhythmia     A flutter   History of kidney stones    Hypercholesterolemia    Hypertension    Psoriatic arthritis ([place])    Sinusitis    Vestibular neuronitis     Past Surgical History:  Procedure Laterality Date   A-FLUTTER ABLATION N/A 10/17/2017   Procedure: A-FLUTTER ABLATION;  Surgeon: Constance Haw, MD;  Location: [facility];  Service: Cardiovascular;  Laterality: N/A;   COLON SURGERY  2001   2 feet removed re- diverticulosis   CORONARY STENT PLACEMENT  2011   sequential bare-metal   CYSTOSCOPY W/ URETERAL STENT PLACEMENT Bilateral 02/05/2020   Procedure: CYSTOSCOPY WITH RETROGRADE PYELOGRAM/URETERAL STENT PLACEMENT;  Surgeon: [name], MD;  [facility];  Service: Urology;  Laterality: Bilateral;   CYSTOSCOPY WITH RETROGRADE PYELOGRAM, URETEROSCOPY AND STENT PLACEMENT Bilateral 03/02/2020   Procedure: CYSTOSCOPY WITH RETROGRADE PYELOGRAM, URETEROSCOPY,AND STENT EXCHANGE;  Surgeon: [name], MD;  [facility];  Service: Urology;  Laterality: Bilateral;  75 MINS   HOLMIUM LASER APPLICATION Bilateral 05/29/9530   Procedure: HOLMIUM LASER APPLICATION;  Surgeon: [name], MD;  [facility];  Service: Urology;  Laterality: Bilateral;   INGUINAL HERNIA REPAIR     OTHER SURGICAL HISTORY     appendectomy    Current  Medications: Current Meds  Medication Sig   apixaban (ELIQUIS) 5 MG TABS tablet Take 1 tablet (5 mg total) by mouth 2 (two) times daily.   blood glucose meter kit and supplies Dispense based on patient and insurance preference. Use up to four times daily as directed. (FOR ICD-10 E11.10).   diphenhydrAMINE (BENADRYL) 25 MG tablet Take 25 mg by mouth daily as needed for allergies.   dronedarone (MULTAQ) 400 MG tablet Take 1 tablet (400 mg total) by mouth 2 (two) times daily with a meal.   ezetimibe (ZETIA) 10 MG tablet Take 1 tablet (10 mg total) by mouth daily.   fenofibrate (TRICOR) 145 MG tablet TAKE 1 TABLET BY MOUTH EVERY DAY   glimepiride (AMARYL) 4 MG tablet Take 4 mg by mouth daily with breakfast.   Insulin Degludec (TRESIBA [place]) Inject 10 Units into the skin daily.   Magnesium 400 MG TABS Take 400 mg by mouth daily.   rosuvastatin (CRESTOR) 5 MG tablet TAKE 1 TABLET(5 MG) BY MOUTH DAILY   Semaglutide (OZEMPIC, 1 MG/DOSE, [place]) Inject into the skin once a week.   [DISCONTINUED] Dulaglutide (TRULICITY) 1.5 YE/3.3ID SOPN Inject 1.5 mg into the skin once a week.   [DISCONTINUED] Dulaglutide 1.5 MG/0.5ML SOPN Inject 1.5 mg into the skin every Friday.   [DISCONTINUED] TOUJEO SOLOSTAR 300 UNIT/ML Solostar Pen SMARTSIG:10 Unit(s) SUB-Q Daily     Allergies:   Ivp dye [iodinated contrast media], Lisinopril, Statins, and Iodine   Social History   Socioeconomic History   Marital status: Single    Spouse name: Not on file   Number of children: 5   Years of education: Not on file   Highest education level: Not on file  Occupational History    Employer: [employer]  Tobacco Use   Smoking status: Never   Smokeless tobacco: Never  Vaping Use   Vaping Use: Never used  Substance and Sexual Activity   Alcohol use: Not Currently   Drug use: Never   Sexual activity: Not on file  Other Topics Concern   Not on file  Social History Narrative   Lives alone   Caffeine-1 coffee   Social Determinants  of Health   Financial Resource Strain: Not on file  Food Insecurity: Not on file  Transportation Needs: Not on file  Physical Activity: Not on file  Stress: Not on file  Social Connections: Not on file     Family History: The patient's family history includes Diabetes in his brother and father; Heart disease in his brother, father, and mother; Hypertension in his father and mother; Stroke in his mother.  ROS:   Please see the history of present illness.     All other systems reviewed and are negative.  EKGs/Labs/Other Studies Reviewed:    The following studies were reviewed today:  Myoview 07/23/2017 The left ventricular ejection fraction is mildly decreased (45-54%). Nuclear stress EF: 54%. There was no ST  segment deviation noted during stress. This is a low risk study.   1. EF 54%, low normal to mildly reduced.  2. No evidence for ischemia or infarction on perfusion images.    Low risk study.   EKG:  EKG is ordered today.  The ekg ordered today demonstrates normal sinus rhythm, no significant ST-T wave changes  Recent Labs: 08/10/2021: BUN 15; Creatinine, Ser 1.63; Hemoglobin 15.9; Platelets 292; Potassium 5.0; Sodium 143  Recent Lipid Panel    Component Value Date/Time   CHOL 173 10/20/2016 0226   TRIG 201 (H) 10/20/2016 0226   HDL 29 (L) 10/20/2016 0226   CHOLHDL 6.0 10/20/2016 0226   VLDL 40 10/20/2016 0226   LDLCALC 104 (H) 10/20/2016 0226   LDLDIRECT 107.6 08/10/2011 1136     Risk Assessment/Calculations:    CHA2DS2-VASc Score = 4   This indicates a 4.8% annual risk of stroke. The patient's score is based upon: CHF History: 0 HTN History: 1 Diabetes History: 1 Stroke History: 0 Vascular Disease History: 1 Age Score: 1 Gender Score: 0          Physical Exam:    VS:  BP 128/74 (BP Location: Right Arm, Patient Position: Sitting, Cuff Size: Normal)   Pulse 61   Ht 6' (1.829 m)   Wt 231 lb (104.8 kg)   BMI 31.33 kg/m     Wt Readings from Last  3 Encounters:  08/10/21 231 lb (104.8 kg)  02/28/21 240 lb (108.9 kg)  12/20/20 241 lb 3.2 oz (109.4 kg)     GEN:  Well nourished, well developed in no acute distress HEENT: Normal NECK: No JVD; No carotid bruits LYMPHATICS: No lymphadenopathy CARDIAC: RRR, no murmurs, rubs, gallops RESPIRATORY:  Clear to auscultation without rales, wheezing or rhonchi  ABDOMEN: Soft, non-tender, non-distended MUSCULOSKELETAL:  No edema; No deformity  SKIN: Warm and dry NEUROLOGIC:  Alert and oriented x 3 PSYCHIATRIC:  Normal affect   ASSESSMENT:    1. Preop cardiovascular exam   2. Atrial flutter, paroxysmal ([place])   3. Medication management   4. Coronary artery disease involving native coronary artery of native heart without angina pectoris   5. Essential hypertension   6. Hyperlipidemia LDL goal <70   7. Controlled type 2 diabetes mellitus without complication, with long-term current use of insulin (HCC)    PLAN:    In order of problems listed above:  Preoperative clearance: Patient has upcoming trigger finger release surgery.  He denies any recent chest pain and can clearly accomplish more than 4 METS of activity.  He may proceed with surgery without further work-up.  He will need a CBC and a basic metabolic panel today for our clinical pharmacist to calculate how long to hold Eliquis prior to the surgery  Atrial fibrillation: On Eliquis and Multaq  CAD: Denies any recent chest pain  Hypertension: Blood pressure stable  Hyperlipidemia: On Zetia and low-dose Crestor  DM2: Managed by primary care provider.  On insulin           Medication Adjustments/Labs and Tests Ordered: Current medicines are reviewed at length with the patient today.  Concerns regarding medicines are outlined above.  Orders Placed This Encounter  Procedures   Basic metabolic panel   CBC   EKG 12-Lead   No orders of the defined types were placed in this encounter.   Patient Instructions  Medication  Instructions:  Your physician recommends that you continue on your current medications as directed. Please refer to the  Current Medication list given to you today.   *If you need a refill on your cardiac medications before your next appointment, please call your pharmacy*   Lab Work: Your physician recommends that you return for lab work today CBC BMET  If you have labs (blood work) drawn today and your tests are completely normal, you will receive your results only by: [place] (if you have [place]) OR A paper copy in the mail If you have any lab test that is abnormal or we need to change your treatment, we will call you to review the results.   Testing/Procedures: NONE ordered at this time of appointment     Follow-Up: At [facility], you and your health needs are our priority.  As part of our continuing mission to provide you with exceptional heart care, we have created designated Provider Care Teams.  These Care Teams include your primary Cardiologist (physician) and Advanced Practice Providers (APPs -  Physician Assistants and Nurse Practitioners) who all work together to provide you with the care you need, when you need it.  We recommend signing up for the patient portal called "MyChart".  Sign up information is provided on this After Visit Summary.  MyChart is used to connect with patients for Virtual Visits (Telemedicine).  Patients are able to view lab/test results, encounter notes, upcoming appointments, etc.  Non-urgent messages can be sent to your provider as well.   To learn more about what you can do with MyChart, go to [email or web address].    Your next appointment:   1 year(s)  The format for your next appointment:   In Person  Provider:   Peter Martinique, MD     Other Instructions   Important Information About Sugar         Hilbert Corrigan, [place]  08/12/2021 11:40 PM    [place]

## 2021-08-10 NOTE — Patient Instructions (Addendum)
Medication Instructions:  Your physician recommends that you continue on your current medications as directed. Please refer to the Current Medication list given to you today.   *If you need a refill on your cardiac medications before your next appointment, please call your pharmacy*   Lab Work: Your physician recommends that you return for lab work today CBC BMET  If you have labs (blood work) drawn today and your tests are completely normal, you will receive your results only by: [place] (if you have [place]) OR A paper copy in the mail If you have any lab test that is abnormal or we need to change your treatment, we will call you to review the results.   Testing/Procedures: NONE ordered at this time of appointment     Follow-Up: At [facility], you and your health needs are our priority.  As part of our continuing mission to provide you with exceptional heart care, we have created designated Provider Care Teams.  These Care Teams include your primary Cardiologist (physician) and Advanced Practice Providers (APPs -  Physician Assistants and Nurse Practitioners) who all work together to provide you with the care you need, when you need it.  We recommend signing up for the patient portal called "MyChart".  Sign up information is provided on this After Visit Summary.  MyChart is used to connect with patients for Virtual Visits (Telemedicine).  Patients are able to view lab/test results, encounter notes, upcoming appointments, etc.  Non-urgent messages can be sent to your provider as well.   To learn more about what you can do with MyChart, go to [email or web address].    Your next appointment:   1 year(s)  The format for your next appointment:   In Person  Provider:   Peter Martinique, MD     Other Instructions   Important Information About Sugar

## 2021-08-11 LAB — CBC
Hematocrit: 47.8 % (ref 37.5–51.0)
Hemoglobin: 15.9 g/dL (ref 13.0–17.7)
MCH: 29.9 pg (ref 26.6–33.0)
MCHC: 33.3 g/dL (ref 31.5–35.7)
MCV: 90 fL (ref 79–97)
Platelets: 292 10*3/uL (ref 150–450)
RBC: 5.31 x10E6/uL (ref 4.14–5.80)
RDW: 12.7 % (ref 11.6–15.4)
WBC: 8.6 10*3/uL (ref 3.4–10.8)

## 2021-08-11 LAB — BASIC METABOLIC PANEL
BUN/Creatinine Ratio: 9 — ABNORMAL LOW (ref 10–24)
BUN: 15 mg/dL (ref 8–27)
CO2: 23 mmol/L (ref 20–29)
Calcium: 10.3 mg/dL — ABNORMAL HIGH (ref 8.6–10.2)
Chloride: 105 mmol/L (ref 96–106)
Creatinine, Ser: 1.63 mg/dL — ABNORMAL HIGH (ref 0.76–1.27)
Glucose: 90 mg/dL (ref 70–99)
Potassium: 5 mmol/L (ref 3.5–5.2)
Sodium: 143 mmol/L (ref 134–144)
eGFR: 44 mL/min/{1.73_m2} — ABNORMAL LOW (ref >59)

## 2021-08-11 NOTE — Telephone Encounter (Signed)
Patient with diagnosis of aflutter on Eliquis for anticoagulation.    Procedure: left long finger trigger release Date of procedure: TBD  CHA2DS2-VASc Score = 4  This indicates a 4.8% annual risk of stroke. The patient's score is based upon: CHF History: 0 HTN History: 1 Diabetes History: 1 Stroke History: 0 Vascular Disease History: 1 Age Score: 1 Gender Score: 0  CrCl 54m/min Platelet count 292K  Per office protocol, patient can hold Eliquis for 1-2 days prior to procedure.    **This guidance is not considered finalized until pre-operative APP has relayed final recommendations.**

## 2021-08-11 NOTE — Telephone Encounter (Signed)
   Name: Shawn Hernandez  DOB: 11-24-1950  MRN: [id number]   Primary Cardiologist: Peter Martinique, MD  Chart reviewed as part of pre-operative protocol coverage. Patient was contacted 08/11/2021 in reference to pre-operative risk assessment for pending surgery as outlined below.  Shawn Hernandez was seen in the cardiology office on 08/10/2021.  He can accomplish more than 4 METS of activity, he is cleared to proceed with upcoming procedure without further work-up.  Therefore, based on ACC/AHA guidelines, the patient would be at acceptable risk for the planned procedure without further cardiovascular testing.   Patient may hold Eliquis for 1 to 2 days prior to the procedure and restart as soon as possible afterward at the surgeon's discretion.  The patient was advised that if he develops new symptoms prior to surgery to contact our office to arrange for a follow-up visit, and he verbalized understanding.  I will route this recommendation to the requesting party via Epic fax function and remove from pre-op pool. Please call with questions.  [place], [place] 08/11/2021, 4:58 PM

## 2021-08-12 ENCOUNTER — Encounter: Payer: Self-pay | Admitting: Physician Assistant

## 2021-08-15 NOTE — Telephone Encounter (Signed)
Patient never returned call  

## 2021-08-29 ENCOUNTER — Ambulatory Visit: Payer: Medicare Other | Admitting: Psychiatry

## 2021-09-13 DIAGNOSIS — I480 Paroxysmal atrial fibrillation: Secondary | ICD-10-CM | POA: Diagnosis not present

## 2021-09-13 DIAGNOSIS — E1122 Type 2 diabetes mellitus with diabetic chronic kidney disease: Secondary | ICD-10-CM | POA: Diagnosis not present

## 2021-09-13 DIAGNOSIS — E782 Mixed hyperlipidemia: Secondary | ICD-10-CM | POA: Diagnosis not present

## 2021-09-13 DIAGNOSIS — I129 Hypertensive chronic kidney disease with stage 1 through stage 4 chronic kidney disease, or unspecified chronic kidney disease: Secondary | ICD-10-CM | POA: Diagnosis not present

## 2021-09-13 DIAGNOSIS — N1832 Chronic kidney disease, stage 3b: Secondary | ICD-10-CM | POA: Diagnosis not present

## 2021-09-20 DIAGNOSIS — E1122 Type 2 diabetes mellitus with diabetic chronic kidney disease: Secondary | ICD-10-CM | POA: Diagnosis not present

## 2021-09-20 DIAGNOSIS — E782 Mixed hyperlipidemia: Secondary | ICD-10-CM | POA: Diagnosis not present

## 2021-09-20 DIAGNOSIS — I480 Paroxysmal atrial fibrillation: Secondary | ICD-10-CM | POA: Diagnosis not present

## 2021-09-20 DIAGNOSIS — D6869 Other thrombophilia: Secondary | ICD-10-CM | POA: Diagnosis not present

## 2021-09-20 DIAGNOSIS — Z794 Long term (current) use of insulin: Secondary | ICD-10-CM | POA: Diagnosis not present

## 2021-09-20 DIAGNOSIS — I129 Hypertensive chronic kidney disease with stage 1 through stage 4 chronic kidney disease, or unspecified chronic kidney disease: Secondary | ICD-10-CM | POA: Diagnosis not present

## 2021-09-20 DIAGNOSIS — L4050 Arthropathic psoriasis, unspecified: Secondary | ICD-10-CM | POA: Diagnosis not present

## 2021-09-20 DIAGNOSIS — N1832 Chronic kidney disease, stage 3b: Secondary | ICD-10-CM | POA: Diagnosis not present

## 2021-09-20 DIAGNOSIS — D692 Other nonthrombocytopenic purpura: Secondary | ICD-10-CM | POA: Diagnosis not present

## 2021-09-29 ENCOUNTER — Ambulatory Visit: Payer: PPO | Admitting: Orthopedic Surgery

## 2021-10-02 ENCOUNTER — Telehealth: Payer: Self-pay | Admitting: Cardiology

## 2021-10-02 NOTE — Telephone Encounter (Signed)
Pt c/o medication issue:  1. Name of Medication: dronedarone (MULTAQ) 400 MG tablet  2. How are you currently taking this medication (dosage and times per day)?   3. Are you having a reaction (difficulty breathing--STAT)?   4. What is your medication issue?  Pt states this medication is headed to the office and is requesting a call to let him know when it arrives.

## 2021-10-03 ENCOUNTER — Ambulatory Visit: Payer: PPO | Admitting: Orthopedic Surgery

## 2021-10-03 ENCOUNTER — Encounter: Payer: Self-pay | Admitting: Orthopedic Surgery

## 2021-10-03 VITALS — Ht 72.0 in | Wt 231.0 lb

## 2021-10-03 DIAGNOSIS — M65332 Trigger finger, left middle finger: Secondary | ICD-10-CM | POA: Diagnosis not present

## 2021-10-03 NOTE — Progress Notes (Signed)
Return Patient Visit  Assessment: Shawn Hernandez is a 71 y.o. male with the following: 1. Trigger finger, left middle finger  Plan: Shawn Hernandez continues to have symptoms consistent with a trigger finger.  He would like to avoid surgery and would like to try another injection.  This was completed in clinic today.  No issues.  Follow up as needed.   Procedure note injection - Left Long finger A1 Pulley  Verbal consent was obtained to inject the Left Long finger A1 pulley Timeout was completed to confirm the site of injection.  The skin was prepped with alcohol and ethyl chloride was sprayed at the injection site.  A 21-gauge needle was used to inject 40 mg of Depo-Medrol and 1% lidocaine (1 cc) into the Left Long finger using a direct anterior approach.  There were no complications. Patient tolerated the procedure well. A sterile bandage was applied   Follow-up: Return if symptoms worsen or fail to improve.  Subjective:  Chief Complaint  Patient presents with   Hand Pain    Lt hand middle finger    History of Present Illness: Shawn Hernandez is a 71 y.o. male who returns to clinic for repeat evaluation of his left long finger.  He has a trigger finger, and this was injected few months ago.  He states this only provided approximately 3-4 weeks of relief of his symptoms.  They gradually returned.  I had a phone visit with him approximately 2 months ago, at which time, he expressed interest in proceeding with surgery.  He did obtain cardiac clearance for surgery, but he is now reluctant to proceed with surgery.  He states he has had some issues with anesthesia in the past.  He continues to have locking sensations in the left long finger.  He has tried taping his fingers, and this has improved some of his symptoms.   Review of Systems: No fevers or chills No numbness or tingling No chest pain No shortness of breath No bowel or bladder dysfunction No GI distress No  headaches    Objective: Ht 6' (1.829 m)   Wt 231 lb (104.8 kg)   BMI 31.33 kg/m   Physical Exam:  General: Alert and oriented. and No acute distress. Gait: Normal gait.  Evaluation left hand demonstrates no deformity.  Normal muscle bulk.  Fingers are warm and well-perfused.  2+ radial pulse.  Sensation is intact throughout the left hand.  He does have some mild tenderness to deep palpation over the A1 pulley to the long finger.  Active triggering is noted in clinic to the long finger.  IMAGING: No new imaging obtained today   New Medications:  No orders of the defined types were placed in this encounter.      Mordecai Rasmussen, MD  10/03/2021 4:17 PM

## 2021-10-03 NOTE — Patient Instructions (Signed)

## 2021-10-12 ENCOUNTER — Telehealth: Payer: Self-pay | Admitting: Pharmacist

## 2021-10-12 NOTE — Telephone Encounter (Signed)
Multaq patient assistance came in the mail.  180 tablets. Placed up front.

## 2021-10-12 NOTE — Telephone Encounter (Signed)
Pt notified, med at office

## 2021-12-01 ENCOUNTER — Other Ambulatory Visit: Payer: Self-pay

## 2021-12-01 MED ORDER — EZETIMIBE 10 MG PO TABS: 10.0000 mg | ORAL_TABLET | Freq: Every day | ORAL | 2 refills | Status: DC

## 2021-12-14 ENCOUNTER — Telehealth: Payer: Self-pay | Admitting: Cardiology

## 2021-12-14 NOTE — Telephone Encounter (Signed)
N ew Message:    Patient wants to know if you have received his new application for his medicine from [employer]?

## 2021-12-15 NOTE — Telephone Encounter (Signed)
Spoke to patient he stated he will not qualify for [facility] patient assistance.He will let me know when to send in application.[employer] patient assistance application mailed to patient to complete and send back with proof of his income.

## 2021-12-18 ENCOUNTER — Other Ambulatory Visit: Payer: Self-pay

## 2021-12-18 MED ORDER — ROSUVASTATIN CALCIUM 5 MG PO TABS: ORAL_TABLET | ORAL | 2 refills | Status: DC

## 2021-12-27 ENCOUNTER — Telehealth: Payer: Self-pay

## 2021-12-27 NOTE — Telephone Encounter (Signed)
Called patient left message on personal voice mail we received 3 bottles of Multaq from patient assistance.Samples will be left at [facility] office front desk.

## 2022-01-02 DIAGNOSIS — Z794 Long term (current) use of insulin: Secondary | ICD-10-CM | POA: Diagnosis not present

## 2022-01-02 DIAGNOSIS — E1169 Type 2 diabetes mellitus with other specified complication: Secondary | ICD-10-CM | POA: Diagnosis not present

## 2022-01-02 DIAGNOSIS — E782 Mixed hyperlipidemia: Secondary | ICD-10-CM | POA: Diagnosis not present

## 2022-01-02 DIAGNOSIS — E1165 Type 2 diabetes mellitus with hyperglycemia: Secondary | ICD-10-CM | POA: Diagnosis not present

## 2022-01-02 DIAGNOSIS — N1832 Chronic kidney disease, stage 3b: Secondary | ICD-10-CM | POA: Diagnosis not present

## 2022-01-02 DIAGNOSIS — I129 Hypertensive chronic kidney disease with stage 1 through stage 4 chronic kidney disease, or unspecified chronic kidney disease: Secondary | ICD-10-CM | POA: Diagnosis not present

## 2022-01-02 DIAGNOSIS — I480 Paroxysmal atrial fibrillation: Secondary | ICD-10-CM | POA: Diagnosis not present

## 2022-01-02 DIAGNOSIS — E1122 Type 2 diabetes mellitus with diabetic chronic kidney disease: Secondary | ICD-10-CM | POA: Diagnosis not present

## 2022-01-02 DIAGNOSIS — R5383 Other fatigue: Secondary | ICD-10-CM | POA: Diagnosis not present

## 2022-01-09 DIAGNOSIS — E1122 Type 2 diabetes mellitus with diabetic chronic kidney disease: Secondary | ICD-10-CM | POA: Diagnosis not present

## 2022-01-09 DIAGNOSIS — Z0000 Encounter for general adult medical examination without abnormal findings: Secondary | ICD-10-CM | POA: Diagnosis not present

## 2022-01-09 DIAGNOSIS — L4050 Arthropathic psoriasis, unspecified: Secondary | ICD-10-CM | POA: Diagnosis not present

## 2022-01-09 DIAGNOSIS — I129 Hypertensive chronic kidney disease with stage 1 through stage 4 chronic kidney disease, or unspecified chronic kidney disease: Secondary | ICD-10-CM | POA: Diagnosis not present

## 2022-01-09 DIAGNOSIS — N1832 Chronic kidney disease, stage 3b: Secondary | ICD-10-CM | POA: Diagnosis not present

## 2022-01-09 DIAGNOSIS — D692 Other nonthrombocytopenic purpura: Secondary | ICD-10-CM | POA: Diagnosis not present

## 2022-01-09 DIAGNOSIS — Z23 Encounter for immunization: Secondary | ICD-10-CM | POA: Diagnosis not present

## 2022-01-09 DIAGNOSIS — I2510 Atherosclerotic heart disease of native coronary artery without angina pectoris: Secondary | ICD-10-CM | POA: Diagnosis not present

## 2022-01-09 DIAGNOSIS — I700 Atherosclerosis of aorta: Secondary | ICD-10-CM | POA: Diagnosis not present

## 2022-01-09 DIAGNOSIS — I480 Paroxysmal atrial fibrillation: Secondary | ICD-10-CM | POA: Diagnosis not present

## 2022-01-09 DIAGNOSIS — E782 Mixed hyperlipidemia: Secondary | ICD-10-CM | POA: Diagnosis not present

## 2022-01-23 ENCOUNTER — Other Ambulatory Visit: Payer: Self-pay | Admitting: *Deleted

## 2022-01-23 DIAGNOSIS — I4892 Unspecified atrial flutter: Secondary | ICD-10-CM

## 2022-01-23 MED ORDER — APIXABAN 5 MG PO TABS: 5.0000 mg | ORAL_TABLET | Freq: Two times a day (BID) | ORAL | 1 refills | Status: DC

## 2022-01-23 NOTE — Telephone Encounter (Signed)
Eliquis '5mg'$  refill request received. Patient is 71 years old, weight-104.8kg, Crea-1.63 on 08/10/2021, Diagnosis-Aflutter, and last seen by [place], [place] on 08/10/2021. Dose is appropriate based on dosing criteria. Will send in refill to requested pharmacy.

## 2022-03-23 ENCOUNTER — Telehealth: Payer: Self-pay | Admitting: Cardiology

## 2022-03-23 NOTE — Telephone Encounter (Signed)
Spoke with patient of Dr. Martinique. He reports constant dizziness. He felt like he could have passed out when he squatted down and then stood up. This is continually happening. He is not on any BP meds. His pulse rate is 97 and BP 117/67  He does not check BP at home. The last time he checked it, prior to today, was several months ago.   Scheduled him to see Dr. Martinique on 2/27, instead of [place] [facility]. Offered sooner visit next week with different APP but he preferred to see MD or [facility]. Advised that he check his BP 1-2 times daily and bring to visit. Advised he stay hydrated,change positions slowly. Will send to Dr. Martinique to review.

## 2022-03-23 NOTE — Telephone Encounter (Signed)
STAT if patient feels like he/she is going to faint   Are you dizzy now? No   Do you feel faint or have you passed out? Yes, feels he is going to faint any time he stands or bends over  Do you have any other symptoms? No   Have you checked your HR and BP (record if available)? Diastolic yesterday was 58   States this has been going on for months. An appt has been scheduled with Almyra Deforest per pt's request to be seen in regards to this.

## 2022-03-27 NOTE — Progress Notes (Signed)
Cardiology Office Note:    Date:  04/03/2022   ID:  [name], DOB 04-Mar-1950, MRN [id number]  PCP:  Shawn Hernandez, [place] Providers Cardiologist:  Jerrell Hart Martinique, MD Electrophysiologist:  Constance Haw, MD     Referring MD: Shawn Seashore, MD   Chief Complaint  Patient presents with   Atrial Fibrillation   Coronary Artery Disease    History of Present Illness:    Shawn Hernandez is a 72 y.o. male with a hx of CAD, hypertension, hyperlipidemia, DM2, atrial flutter and history of psoriatic arthritis.  He was seen in March 2022 for evaluation of dizziness and palpitation.  Prior to that, patient had a bare-metal stent to OM in 2010 with residual 50% LAD and a 70% RCA lesion.  History of  intolerance of statins.  He had atrial flutter ablation in September 2019, on follow-up he had paroxysmal atrial fibrillation and was placed on Multaq.   When I saw him in 2022 his  Multaq was discontinued given lack of recurrence for A-fib in over 2 years.  He unfortunately had tachypalpitations after coming off of Multaq, and he decided to resume the medication.  He was seen by A-fib clinic in November 2022 at which time he was doing well.  He was considering switching to a different antiarrhythmic therapy.  During the last office visit in the A-fib clinic in November, it was discussed with the patient possible dofetilide versus amiodarone.  He eventually decided to continue on Multaq.  He also inquired about a Watchman device.  He was last seen by Almyra Deforest PA-C in July 2023 for preop clearance for hand surgery and was doing well. Ecg was normal.   He reports that he has experienced problems with dizziness over the past 2 years. He does have chronic sinus problems and can't hear out of his right ear. Has seen Neuro and MRI/MRA showed small vessel ischemic changes. He notes symptoms are worse when he changes position of his head. Some when he changes position. Has  tried vestibular exercises without much benefit. No Afib. Usually only happens 1-2 times a year.    Past Medical History:  Diagnosis Date   Allergy to iodine    Arthritis    discomfort in hands and arms   Atrial flutter, paroxysmal ([place])    Coronary artery disease    Diabetes type 2, controlled ([place]) 10/2016   Diverticulosis    Dysrhythmia     A flutter   History of kidney stones    Hypercholesterolemia    Hypertension    Psoriatic arthritis ([place])    Sinusitis    Vestibular neuronitis     Past Surgical History:  Procedure Laterality Date   A-FLUTTER ABLATION N/A 10/17/2017   Procedure: A-FLUTTER ABLATION;  Surgeon: Constance Haw, MD;  Location: [facility];  Service: Cardiovascular;  Laterality: N/A;   COLON SURGERY  2001   2 feet removed re- diverticulosis   CORONARY STENT PLACEMENT  2011   sequential bare-metal   CYSTOSCOPY W/ URETERAL STENT PLACEMENT Bilateral 02/05/2020   Procedure: CYSTOSCOPY WITH RETROGRADE PYELOGRAM/URETERAL STENT PLACEMENT;  Surgeon: [name], MD;  Location: [facility];  Service: Urology;  Laterality: Bilateral;   CYSTOSCOPY WITH RETROGRADE PYELOGRAM, URETEROSCOPY AND STENT PLACEMENT Bilateral 03/02/2020   Procedure: CYSTOSCOPY WITH RETROGRADE PYELOGRAM, URETEROSCOPY,AND STENT EXCHANGE;  Surgeon: [name], MD;  Location: [facility];  Service: Urology;  Laterality: Bilateral;  75 MINS   HOLMIUM LASER APPLICATION  Bilateral 03/02/2020   Procedure: HOLMIUM LASER APPLICATION;  Surgeon: [name], MD;  Location: [facility];  Service: Urology;  Laterality: Bilateral;   INGUINAL HERNIA REPAIR     OTHER SURGICAL HISTORY     appendectomy    Current Medications: Current Meds  Medication Sig   apixaban (ELIQUIS) 5 MG TABS tablet Take 1 tablet (5 mg total) by mouth 2 (two) times daily.   blood glucose meter kit and supplies Dispense based on patient and insurance preference. Use up to four times daily as directed. (FOR ICD-10 E11.10).    diphenhydrAMINE (BENADRYL) 25 MG tablet Take 25 mg by mouth daily as needed for allergies.   dronedarone (MULTAQ) 400 MG tablet Take 1 tablet (400 mg total) by mouth 2 (two) times daily with a meal.   ezetimibe (ZETIA) 10 MG tablet Take 10 mg by mouth daily.   Magnesium 400 MG TABS Take 400 mg by mouth daily.   rosuvastatin (CRESTOR) 5 MG tablet TAKE 1 TABLET(5 MG) BY MOUTH DAILY   Semaglutide (OZEMPIC, 1 MG/DOSE, [place]) Inject into the skin once a week.   [DISCONTINUED] ezetimibe (ZETIA) 10 MG tablet Take 1 tablet (10 mg total) by mouth daily. Please schedule appointment for additional refills.     Allergies:   Ivp dye [iodinated contrast media], Lisinopril, Statins, and Iodine   Social History   Socioeconomic History   Marital status: Single    Spouse name: Not on file   Number of children: 5   Years of education: Not on file   Highest education level: Not on file  Occupational History    Employer: [employer]  Tobacco Use   Smoking status: Never   Smokeless tobacco: Never  Vaping Use   Vaping Use: Never used  Substance and Sexual Activity   Alcohol use: Not Currently   Drug use: Never   Sexual activity: Not on file  Other Topics Concern   Not on file  Social History Narrative   Lives alone   Caffeine-1 coffee   Social Determinants of Health   Financial Resource Strain: Not on file  Food Insecurity: Not on file  Transportation Needs: Not on file  Physical Activity: Not on file  Stress: Not on file  Social Connections: Not on file     Family History: The patient's family history includes Diabetes in his brother and father; Heart disease in his brother, father, and mother; Hypertension in his father and mother; Stroke in his mother.  ROS:   Please see the history of present illness.     All other systems reviewed and are negative.  EKGs/Labs/Other Studies Reviewed:    The following studies were reviewed today:  Myoview 07/23/2017 The left ventricular ejection fraction  is mildly decreased (45-54%). Nuclear stress EF: 54%. There was no ST segment deviation noted during stress. This is a low risk study.   1. EF 54%, low normal to mildly reduced.  2. No evidence for ischemia or infarction on perfusion images.    Low risk study.   EKG:  EKG is ordered today.  The ekg ordered today demonstrates normal sinus rhythm, no significant ST-T wave changes. Rate 70. Normal QT. I have personally reviewed and interpreted this study.   Recent Labs: 08/10/2021: BUN 15; Creatinine, Ser 1.63; Hemoglobin 15.9; Platelets 292; Potassium 5.0; Sodium 143  Recent Lipid Panel    Component Value Date/Time   CHOL 173 10/20/2016 0226   TRIG 201 (H) 10/20/2016 0226   HDL 29 (L) 10/20/2016 [id number]  CHOLHDL 6.0 10/20/2016 0226   VLDL 40 10/20/2016 0226   LDLCALC 104 (H) 10/20/2016 0226   LDLDIRECT 107.6 08/10/2011 1136     Risk Assessment/Calculations:    CHA2DS2-VASc Score = 4   This indicates a 4.8% annual risk of stroke. The patient's score is based upon: CHF History: 0 HTN History: 1 Diabetes History: 1 Stroke History: 0 Vascular Disease History: 1 Age Score: 1 Gender Score: 0          Physical Exam:    VS:  BP 124/72   Pulse 70   Ht 6' (1.829 m)   Wt 212 lb 12.8 oz (96.5 kg)   SpO2 94%   BMI 28.86 kg/m     Wt Readings from Last 3 Encounters:  04/03/22 212 lb 12.8 oz (96.5 kg)  10/03/21 231 lb (104.8 kg)  08/10/21 231 lb (104.8 kg)    Orthostatic BP 140/94 lying, 138/86 sitting, 134/990 standing first minute, 140/90 standing 3 minutes.  GEN:  Well nourished, well developed in no acute distress HEENT: Normal NECK: No JVD; No carotid bruits LYMPHATICS: No lymphadenopathy CARDIAC: RRR, no murmurs, rubs, gallops RESPIRATORY:  Clear to auscultation without rales, wheezing or rhonchi  ABDOMEN: Soft, non-tender, non-distended MUSCULOSKELETAL:  No edema; No deformity  SKIN: Warm and dry NEUROLOGIC:  Alert and oriented x 3 PSYCHIATRIC:  Normal affect    ASSESSMENT:    1. Paroxysmal atrial fibrillation (HCC)   2. Coronary artery disease involving native coronary artery of native heart without angina pectoris   3. Hyperlipidemia LDL goal <70   4. Dizziness     PLAN:    In order of problems listed above:  Paroxysmal Atrial fibrillation: On Eliquis and Multaq. Excellent control  CAD: Denies any recent chest pain. Not on ASA since on Eliquis. On statin.   Hypertension: Blood pressure stable on no meds.   Hyperlipidemia: On Zetia and low-dose Crestor  DM2: Managed by primary care provider.  On insulin  6.   Dizziness. I suspect this is more vestibular. No orthostasis on exam.            Medication Adjustments/Labs and Tests Ordered: Current medicines are reviewed at length with the patient today.  Concerns regarding medicines are outlined above.  No orders of the defined types were placed in this encounter.  No orders of the defined types were placed in this encounter.   There are no Patient Instructions on file for this visit.   Signed, Sokhna Christoph Martinique, MD  04/03/2022 2:18 PM    [place]

## 2022-03-30 IMAGING — CT CT ABD-PELV W/O CM
2 of 4 series · 15 of 46 positions shown, 17 images · non-contrast
Comparison: April 26, 2017.

CLINICAL DATA: Left lower quadrant abdominal pain.

EXAM:
CT ABDOMEN AND PELVIS WITHOUT CONTRAST
TECHNIQUE: Multidetector CT imaging of the abdomen and pelvis was performed
following the standard protocol without IV contrast.

[Series 3: abd/ pelvis 5.0 i30f 2 · axial · 0.88mm/px · z∈[-453,+27]mm · 12 of 106 slices shown, 14 images]
[im 5/106  soft-tissue]
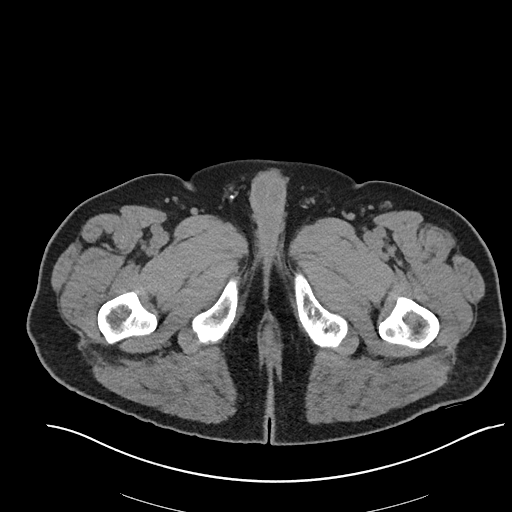
[im 5/106  bone]
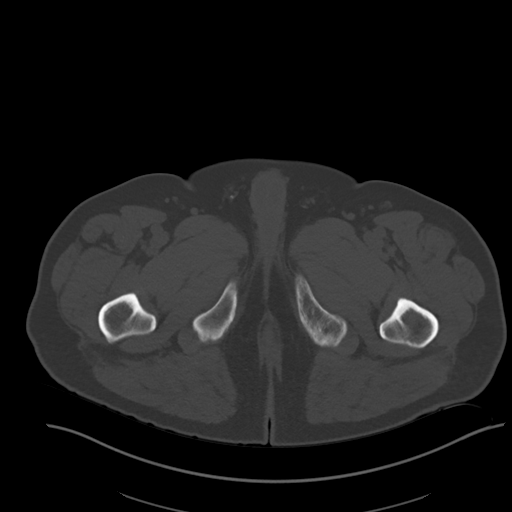
[im 15/106  soft-tissue]
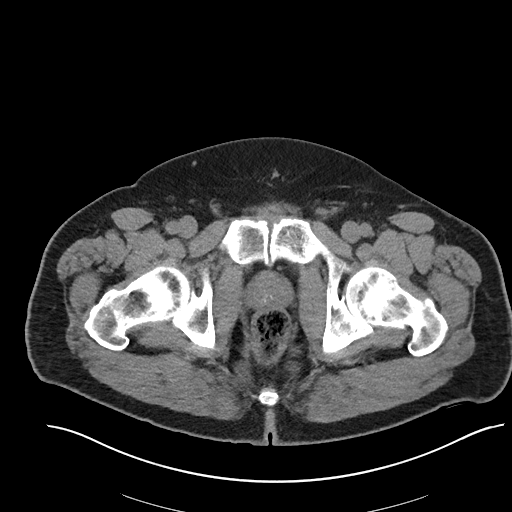
[im 24/106  soft-tissue]
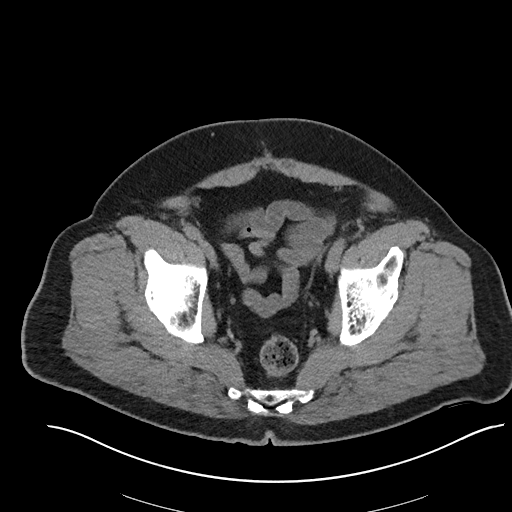
[im 34/106  soft-tissue]
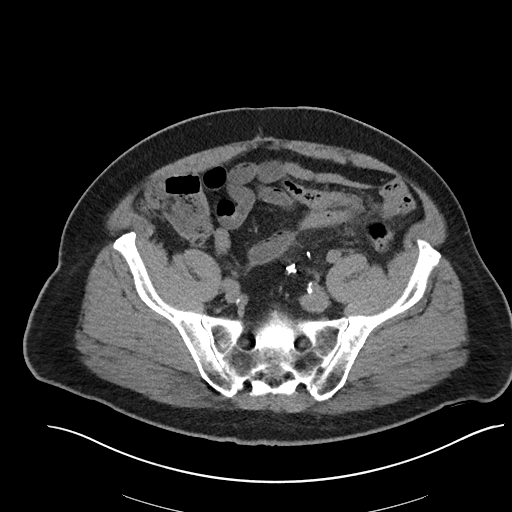
[im 39/106  soft-tissue]
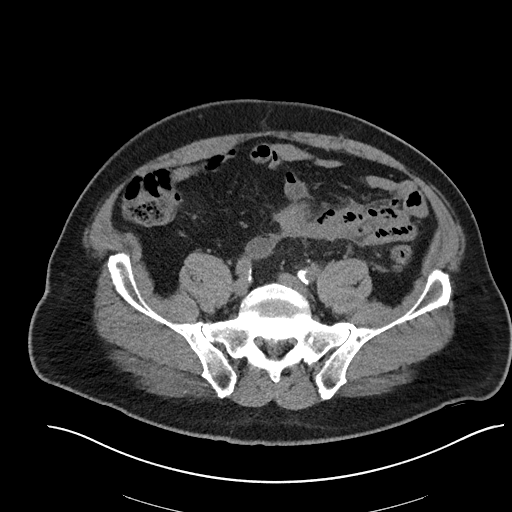
[im 48/106  soft-tissue]
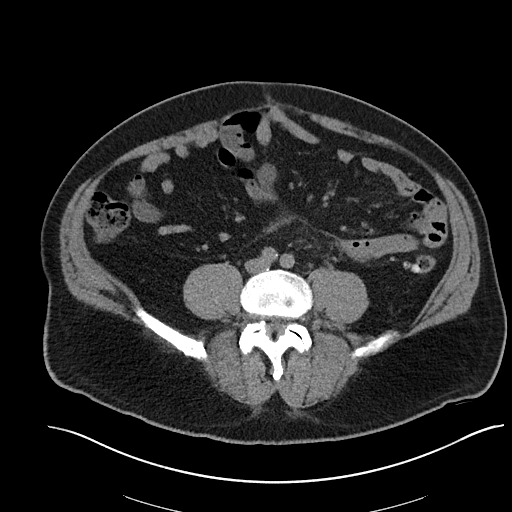
[im 58/106  soft-tissue]
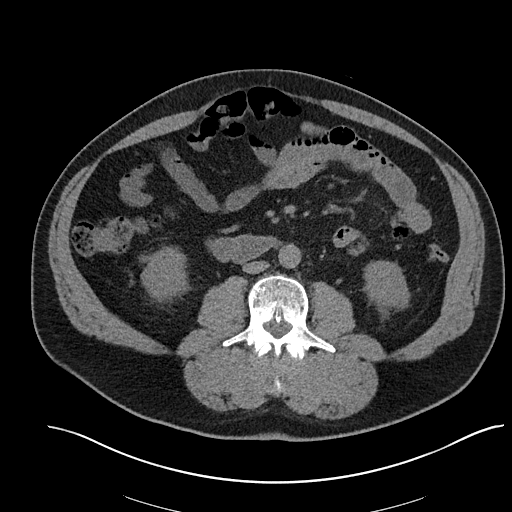
[im 67/106  soft-tissue]
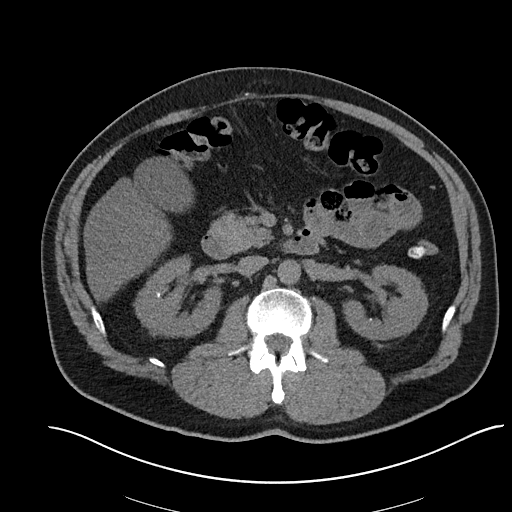
[im 72/106  soft-tissue]
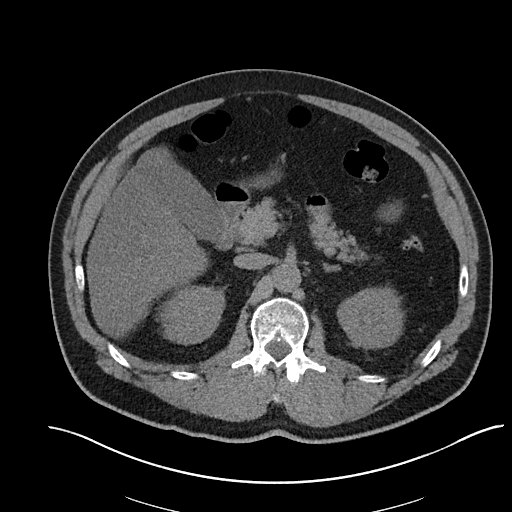
[im 72/106  bone]
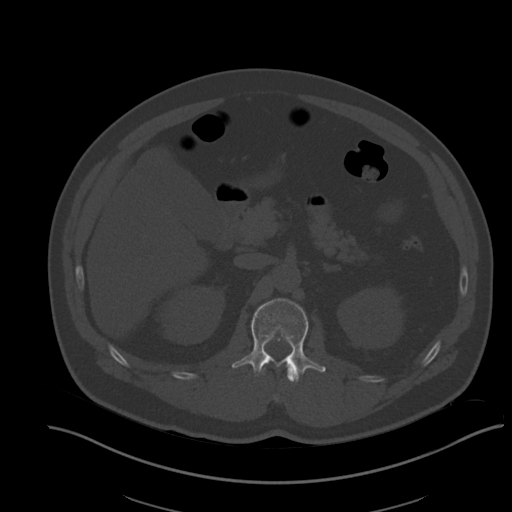
[im 82/106  soft-tissue]
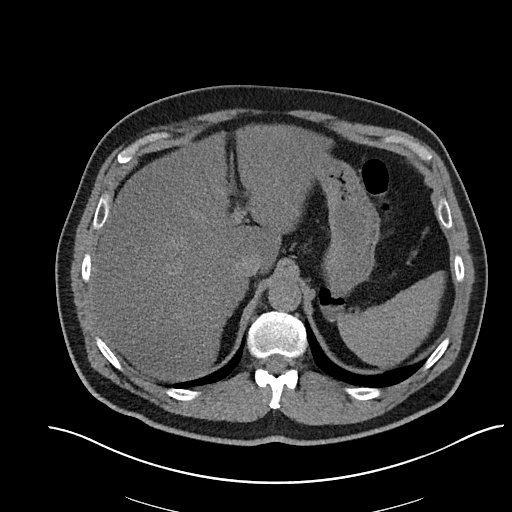
[im 91/106  soft-tissue]
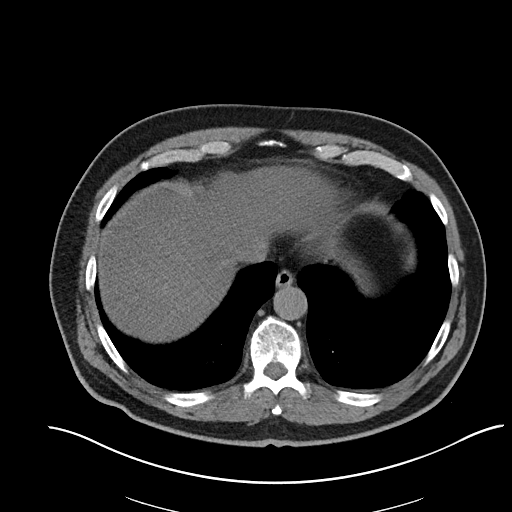
[im 101/106  soft-tissue]
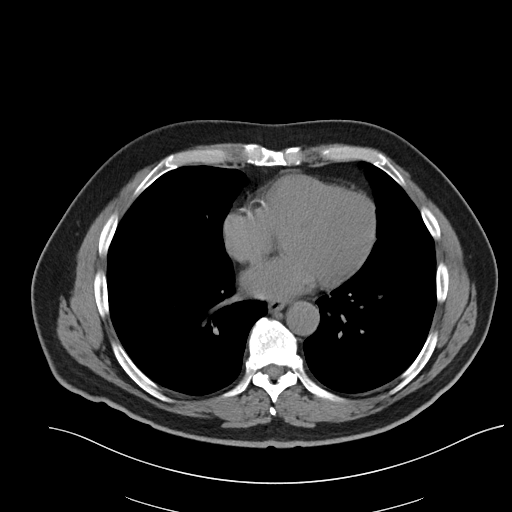

[Series 6: cor st · coronal · 0.76mm/px · 3 of 101 slices shown]
[im 34/101  soft-tissue]
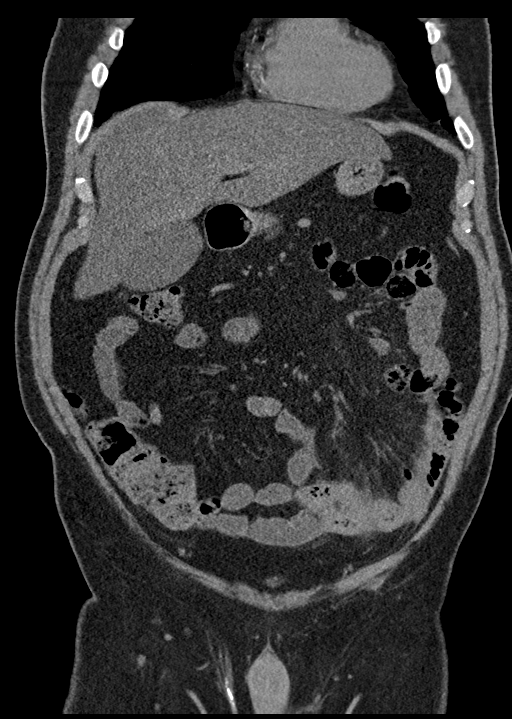
[im 45/101  soft-tissue]
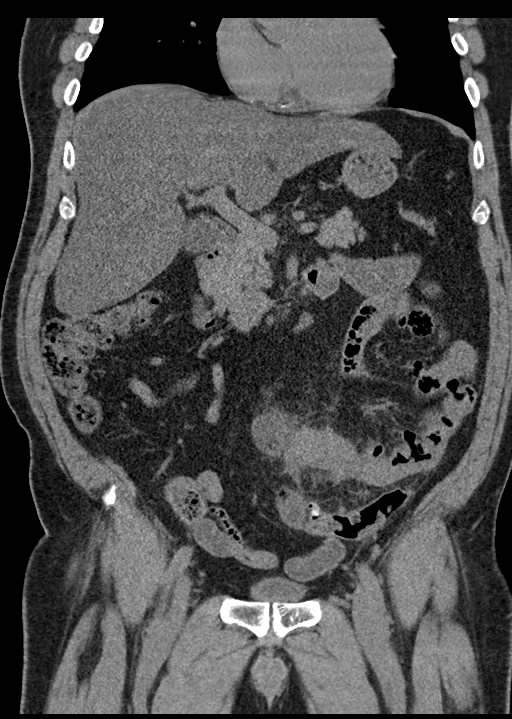
[im 56/101  soft-tissue]
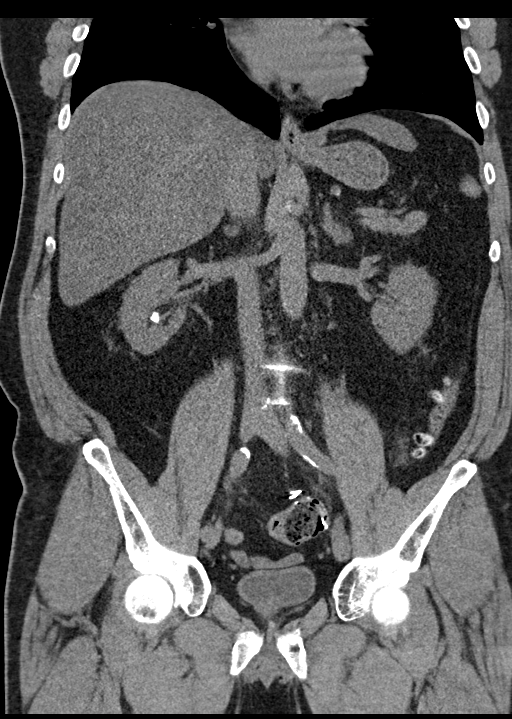

[15 of 46 positions shown; findings below may reference images not displayed]

FINDINGS: Lower chest: The lung bases are clear. The heart size is normal.

Hepatobiliary: There is decreased hepatic attenuation suggestive of
hepatic steatosis. Normal gallbladder.There is no biliary ductal
dilation.

Pancreas: Normal contours without ductal dilatation. No
peripancreatic fluid collection.

Spleen: Unremarkable.

Adrenals/Urinary Tract:

--Adrenal glands: Unremarkable.

--Right kidney/ureter: Multiple nonobstructing stones are noted
measuring up to approximately 7 mm in the lower pole.

--Left kidney/ureter: Multiple nonobstructing stones are noted
measuring up to approximately 5 mm in the lower pole.

--Urinary bladder: Unremarkable.

Stomach/Bowel:

--Stomach/Duodenum: There is a posterior gastric diverticulum.

--Small bowel: There are inflammatory changes in the left lower
quadrant surrounding multiple loops of small bowel. Many of the
small bowel loops demonstrate prominent diverticula. There is
extraluminal air without evidence for a large abscess. There is no
bowel obstruction.

--Colon: There is a rectosigmoid anastomosis that is patent. There
is no evidence for colonic diverticulitis.

--Appendix: Normal.

Vascular/Lymphatic: Atherosclerotic calcification is present within
the non-aneurysmal abdominal aorta, without hemodynamically
significant stenosis.

--No retroperitoneal lymphadenopathy.

--No mesenteric lymphadenopathy.

--No pelvic or inguinal lymphadenopathy.

Reproductive: Unremarkable

Other: No ascites or free air. The abdominal wall is normal.

Musculoskeletal. No acute displaced fractures.
IMPRESSION: 1. Inflammatory changes in the left lower quadrant surrounding
multiple loops of small bowel. There is extraluminal air without
evidence for a large abscess. Findings are concerning for acute
complicated small-bowel diverticulitis.
2. Colonic diverticulosis without CT evidence for colonic
diverticulitis.
3. Bilateral nonobstructing nephrolithiasis.
4. Hepatic steatosis.

Aortic Atherosclerosis ([id number].Q).

## 2022-04-03 ENCOUNTER — Encounter: Payer: Self-pay | Admitting: Cardiology

## 2022-04-03 ENCOUNTER — Ambulatory Visit: Payer: PPO | Admitting: Physician Assistant

## 2022-04-03 ENCOUNTER — Ambulatory Visit: Payer: PPO | Attending: Cardiology | Admitting: Cardiology

## 2022-04-03 VITALS — BP 124/72 | HR 70 | Ht 72.0 in | Wt 212.8 lb

## 2022-04-03 DIAGNOSIS — I2510 Atherosclerotic heart disease of native coronary artery without angina pectoris: Secondary | ICD-10-CM | POA: Diagnosis not present

## 2022-04-03 DIAGNOSIS — E785 Hyperlipidemia, unspecified: Secondary | ICD-10-CM | POA: Diagnosis not present

## 2022-04-03 DIAGNOSIS — I480 Paroxysmal atrial fibrillation: Secondary | ICD-10-CM

## 2022-04-03 DIAGNOSIS — R42 Dizziness and giddiness: Secondary | ICD-10-CM | POA: Diagnosis not present

## 2022-04-03 NOTE — Patient Instructions (Signed)
Medication Instructions:  No changes *If you need a refill on your cardiac medications before your next appointment, please call your pharmacy*  Follow-Up: At [facility], you and your health needs are our priority.  As part of our continuing mission to provide you with exceptional heart care, we have created designated Provider Care Teams.  These Care Teams include your primary Cardiologist (physician) and Advanced Practice Providers (APPs -  Physician Assistants and Nurse Practitioners) who all work together to provide you with the care you need, when you need it.  We recommend signing up for the patient portal called "MyChart".  Sign up information is provided on this After Visit Summary.  MyChart is used to connect with patients for Virtual Visits (Telemedicine).  Patients are able to view lab/test results, encounter notes, upcoming appointments, etc.  Non-urgent messages can be sent to your provider as well.   To learn more about what you can do with MyChart, go to [email or web address].    Your next appointment:   1 year(s)  Provider:   Peter Martinique, MD

## 2022-04-05 ENCOUNTER — Encounter: Payer: Self-pay | Admitting: Radiology

## 2022-04-05 DIAGNOSIS — I129 Hypertensive chronic kidney disease with stage 1 through stage 4 chronic kidney disease, or unspecified chronic kidney disease: Secondary | ICD-10-CM | POA: Diagnosis not present

## 2022-04-05 DIAGNOSIS — I480 Paroxysmal atrial fibrillation: Secondary | ICD-10-CM | POA: Diagnosis not present

## 2022-04-05 DIAGNOSIS — Z794 Long term (current) use of insulin: Secondary | ICD-10-CM | POA: Diagnosis not present

## 2022-04-05 DIAGNOSIS — I639 Cerebral infarction, unspecified: Secondary | ICD-10-CM | POA: Diagnosis not present

## 2022-04-05 DIAGNOSIS — E1165 Type 2 diabetes mellitus with hyperglycemia: Secondary | ICD-10-CM | POA: Diagnosis not present

## 2022-04-05 DIAGNOSIS — E782 Mixed hyperlipidemia: Secondary | ICD-10-CM | POA: Diagnosis not present

## 2022-04-05 DIAGNOSIS — E1169 Type 2 diabetes mellitus with other specified complication: Secondary | ICD-10-CM | POA: Diagnosis not present

## 2022-04-05 DIAGNOSIS — N1832 Chronic kidney disease, stage 3b: Secondary | ICD-10-CM | POA: Diagnosis not present

## 2022-05-11 ENCOUNTER — Telehealth: Payer: Self-pay | Admitting: Cardiology

## 2022-05-11 NOTE — Telephone Encounter (Signed)
Patient states he contacted [employer] for [employer] yesterday.  The patient states the representative said they have no record of patient assistance for patient for this year.   RN informed patient is reviewing your chart it looks it was in Nov  2023 the information was sent to  company  RN informed patient will send message to Dr Elvis Coil nurse to follow up .

## 2022-05-11 NOTE — Telephone Encounter (Signed)
Patient is returning call.  °

## 2022-05-11 NOTE — Telephone Encounter (Signed)
Spoke to patient advised I will mail him a new [employer] patient assistance application for [employer].Advised to complete and bring back to office along with proof of income.

## 2022-05-11 NOTE — Telephone Encounter (Signed)
Left Voicemail to call office.

## 2022-05-11 NOTE — Telephone Encounter (Signed)
Pt c/o medication issue:  1. Name of Medication: dronedarone (MULTAQ) 400 MG tablet    2. How are you currently taking this medication (dosage and times per day)?   3. Are you having a reaction (difficulty breathing--STAT)?   4. What is your medication issue? Patient is requesting call back to go over medication and how to receive and to discuss new orders for medication.

## 2022-05-16 ENCOUNTER — Telehealth: Payer: Self-pay | Admitting: Cardiology

## 2022-05-16 DIAGNOSIS — I4892 Unspecified atrial flutter: Secondary | ICD-10-CM

## 2022-05-16 NOTE — Telephone Encounter (Signed)
*  STAT* If patient is at the pharmacy, call can be transferred to refill team.   1. Which medications need to be refilled? (please list name of each medication and dose if known)   apixaban (ELIQUIS) 5 MG TABS tablet    2. Which pharmacy/location (including street and city if local pharmacy) is medication to be sent to?   [facility] [address] [place], [place], [place] [address] [email or web address] [phone]  3. Do they need a 30 day or 90 day supply? ONE YEAR SUPPLY REQUESTED

## 2022-05-16 NOTE — Telephone Encounter (Signed)
Paper Work Dropped Off: envelope  Date: 04.10.24  Location of paper:  Handed to [employer]

## 2022-05-18 MED ORDER — APIXABAN 5 MG PO TABS: 5.0000 mg | ORAL_TABLET | Freq: Two times a day (BID) | ORAL | 3 refills | Status: DC

## 2022-05-18 NOTE — Telephone Encounter (Signed)
WE DO NOT SEND TO [facility]-LEFT DETAILED MESSAGE Will send to [facility] as listed in chart.

## 2022-05-21 ENCOUNTER — Telehealth: Payer: Self-pay | Admitting: Cardiology

## 2022-05-21 DIAGNOSIS — I4892 Unspecified atrial flutter: Secondary | ICD-10-CM

## 2022-05-21 NOTE — Telephone Encounter (Signed)
Pt is wanting to know if Multaq assistance has been done and is also wanting hard copy script of Eliquis Will forward to Anabel Halon LPN ./cy

## 2022-05-21 NOTE — Telephone Encounter (Signed)
Patient requested a call back from [place], patient only expressed that it was in regards to the Multaq medication. Please advise

## 2022-05-22 NOTE — Telephone Encounter (Signed)
Pt called to f/u on getting a callback from nurse and would like a callback as soon as possible. Please advise.

## 2022-05-22 NOTE — Telephone Encounter (Signed)
Call to patient and straight to voicemail. He ask regarding medication and for update on patient assistance

## 2022-05-23 MED ORDER — APIXABAN 5 MG PO TABS: 5.0000 mg | ORAL_TABLET | Freq: Two times a day (BID) | ORAL | 3 refills | Status: DC

## 2022-05-23 NOTE — Telephone Encounter (Signed)
That is fine to give him hard copy Eliquis rx,  BID dosing is correct based on his age and weight for afib indication.

## 2022-05-23 NOTE — Telephone Encounter (Signed)
Left VM that prescription sent to his Pharmacy

## 2022-05-24 ENCOUNTER — Other Ambulatory Visit: Payer: Self-pay

## 2022-05-24 ENCOUNTER — Telehealth: Payer: Self-pay | Admitting: Cardiology

## 2022-05-24 MED ORDER — MULTAQ 400 MG PO TABS: 400.0000 mg | ORAL_TABLET | Freq: Two times a day (BID) | ORAL | 3 refills | Status: DC

## 2022-05-24 NOTE — Telephone Encounter (Signed)
Patient called to get an update for the medication Multaq. Pt stated he did drop of the request documentation on 05/16/22. Will forward to nurse for advise.

## 2022-05-24 NOTE — Telephone Encounter (Signed)
Pt c/o medication issue:  1. Name of Medication: dronedarone (MULTAQ) 400 MG tablet   2. How are you currently taking this medication (dosage and times per day)? Take 1 tablet (400 mg total) by mouth 2 (two) times daily with a meal.   3. Are you having a reaction (difficulty breathing--STAT)? No   4. What is your medication issue? Patient is calling to get the status of his medication. Sent medication to hospital.

## 2022-05-24 NOTE — Telephone Encounter (Signed)
Spoke to patient  patient assistance for Multaq faxed to [employer] at fax # [phone].

## 2022-05-25 NOTE — Telephone Encounter (Signed)
See 05/24/22 telephone note.

## 2022-05-29 ENCOUNTER — Other Ambulatory Visit: Payer: Self-pay | Admitting: Cardiology

## 2022-05-29 DIAGNOSIS — I4892 Unspecified atrial flutter: Secondary | ICD-10-CM

## 2022-05-29 NOTE — Telephone Encounter (Signed)
Eliquis was refilled on 05/23/22 180 tablets with 3 refills, receipt confirmed by pharmacy.  Called pharmacy spoke to [facility] to confirm receipt, he states they never received rx refill.  Will resend.  Pt last saw Dr Swaziland 04/03/22, last labs 08/10/21 Creat 1.63, age 72, weight 96.5kg, based on specified criteria pt is on appropriate dosage of Eliquis  BID for afib.  Will refill rx.

## 2022-06-05 ENCOUNTER — Telehealth: Payer: Self-pay | Admitting: Cardiology

## 2022-06-05 NOTE — Telephone Encounter (Signed)
Pt c/o medication issue:  1. Name of Medication: Multaq   2. How are you currently taking this medication (dosage and times per day)? As prescribed  3. Are you having a reaction (difficulty breathing--STAT)?   4. What is your medication issue? Patient is requesting call back to get update on this medication. He states that he received letter that it has been shipped and should have arrived. Please advise.

## 2022-06-05 NOTE — Telephone Encounter (Signed)
Called pt to let him know we do not control delivery or have any part in that. Pt advised to call the customer service number on the letter to inquire bout the shipment. He verbalized understanding.

## 2022-06-08 ENCOUNTER — Telehealth: Payer: Self-pay | Admitting: Cardiology

## 2022-06-08 NOTE — Telephone Encounter (Signed)
Patient informed about prescription arrival. Will leave at front desk for patient to collect.

## 2022-06-08 NOTE — Telephone Encounter (Signed)
Pt c/o medication issue:  1. Name of Medication: Multaq   2. How are you currently taking this medication (dosage and times per day)? As written  3. Are you having a reaction (difficulty breathing--STAT)? No  4. What is your medication issue? Patient received a notification that his medicine has arrived at our office. Patient would like a call back to confirm that we have the medication for him to be able to come by and pick up. Patient stated they are currently out of the medication.

## 2022-06-08 NOTE — Telephone Encounter (Signed)
Patient called again to check on getting his medication which he said was delivered today per [employer] tracking.  Patient stated he is out of this medication and want to collect this today.

## 2022-06-12 DIAGNOSIS — E119 Type 2 diabetes mellitus without complications: Secondary | ICD-10-CM | POA: Diagnosis not present

## 2022-06-12 DIAGNOSIS — H2513 Age-related nuclear cataract, bilateral: Secondary | ICD-10-CM | POA: Diagnosis not present

## 2022-06-26 DIAGNOSIS — I2510 Atherosclerotic heart disease of native coronary artery without angina pectoris: Secondary | ICD-10-CM | POA: Diagnosis not present

## 2022-06-26 DIAGNOSIS — I480 Paroxysmal atrial fibrillation: Secondary | ICD-10-CM | POA: Diagnosis not present

## 2022-06-26 DIAGNOSIS — N1832 Chronic kidney disease, stage 3b: Secondary | ICD-10-CM | POA: Diagnosis not present

## 2022-06-26 DIAGNOSIS — L4050 Arthropathic psoriasis, unspecified: Secondary | ICD-10-CM | POA: Diagnosis not present

## 2022-06-26 DIAGNOSIS — I700 Atherosclerosis of aorta: Secondary | ICD-10-CM | POA: Diagnosis not present

## 2022-06-26 DIAGNOSIS — D692 Other nonthrombocytopenic purpura: Secondary | ICD-10-CM | POA: Diagnosis not present

## 2022-06-26 DIAGNOSIS — E782 Mixed hyperlipidemia: Secondary | ICD-10-CM | POA: Diagnosis not present

## 2022-06-26 DIAGNOSIS — E1122 Type 2 diabetes mellitus with diabetic chronic kidney disease: Secondary | ICD-10-CM | POA: Diagnosis not present

## 2022-06-26 DIAGNOSIS — I129 Hypertensive chronic kidney disease with stage 1 through stage 4 chronic kidney disease, or unspecified chronic kidney disease: Secondary | ICD-10-CM | POA: Diagnosis not present

## 2022-07-03 DIAGNOSIS — E1122 Type 2 diabetes mellitus with diabetic chronic kidney disease: Secondary | ICD-10-CM | POA: Diagnosis not present

## 2022-07-03 DIAGNOSIS — E782 Mixed hyperlipidemia: Secondary | ICD-10-CM | POA: Diagnosis not present

## 2022-07-03 DIAGNOSIS — N1832 Chronic kidney disease, stage 3b: Secondary | ICD-10-CM | POA: Diagnosis not present

## 2022-07-03 DIAGNOSIS — D692 Other nonthrombocytopenic purpura: Secondary | ICD-10-CM | POA: Diagnosis not present

## 2022-07-03 DIAGNOSIS — I700 Atherosclerosis of aorta: Secondary | ICD-10-CM | POA: Diagnosis not present

## 2022-07-03 DIAGNOSIS — I129 Hypertensive chronic kidney disease with stage 1 through stage 4 chronic kidney disease, or unspecified chronic kidney disease: Secondary | ICD-10-CM | POA: Diagnosis not present

## 2022-07-03 DIAGNOSIS — I480 Paroxysmal atrial fibrillation: Secondary | ICD-10-CM | POA: Diagnosis not present

## 2022-07-03 DIAGNOSIS — L4050 Arthropathic psoriasis, unspecified: Secondary | ICD-10-CM | POA: Diagnosis not present

## 2022-07-03 DIAGNOSIS — I2510 Atherosclerotic heart disease of native coronary artery without angina pectoris: Secondary | ICD-10-CM | POA: Diagnosis not present

## 2022-08-16 ENCOUNTER — Telehealth: Payer: Self-pay | Admitting: Cardiology

## 2022-08-16 NOTE — Telephone Encounter (Signed)
Patient calling to see if we had watches that monitors EKG. He is aware he have to purchase those for self.

## 2022-08-16 NOTE — Telephone Encounter (Signed)
Patient wants to get a recommendation for a wrist watch that patient can monitor ECGs.

## 2022-08-16 NOTE — Telephone Encounter (Signed)
Left voicemail to return call to office.

## 2022-09-03 ENCOUNTER — Telehealth: Payer: Self-pay

## 2022-09-03 NOTE — Telephone Encounter (Signed)
Spoke with patient he is aware his Multaq is here in office and ready for pick up.

## 2022-09-19 ENCOUNTER — Other Ambulatory Visit: Payer: Self-pay

## 2022-09-19 IMAGING — CT CT ABD-PELV W/O CM
2 of 4 series · 16 of 46 positions shown, 18 images · non-contrast
Comparison: CT abdomen dated 10/19/2019

CLINICAL DATA: LEFT-sided abdominal pain since [REDACTED] night.

EXAM:
CT ABDOMEN AND PELVIS WITHOUT CONTRAST
TECHNIQUE: Multidetector CT imaging of the abdomen and pelvis was performed
following the standard protocol without IV contrast.

[Series 2: axial st · axial · 0.90mm/px · z∈[-583,-133]mm · 13 of 104 slices shown, 15 images]
[im 7/104  soft-tissue]
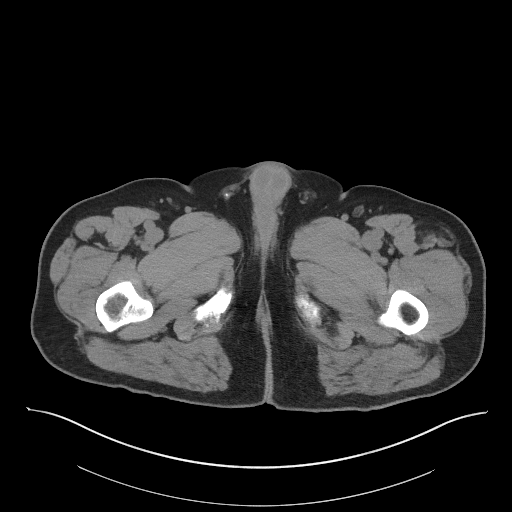
[im 7/104  bone]
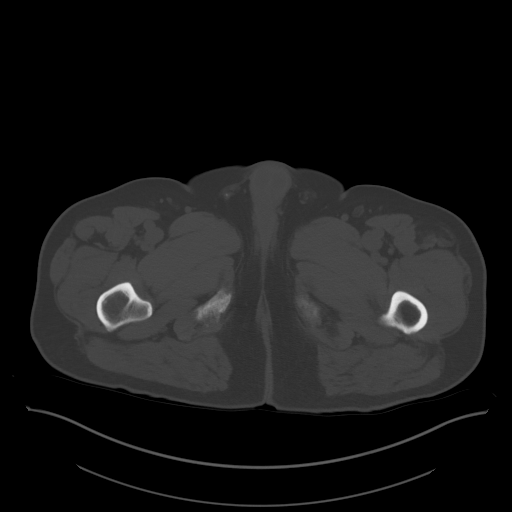
[im 13/104  soft-tissue]
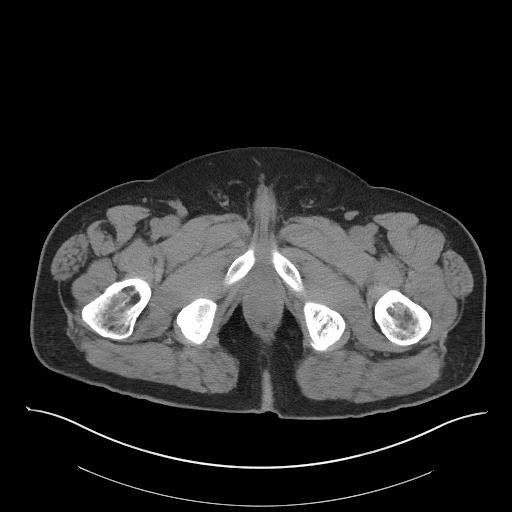
[im 25/104  soft-tissue]
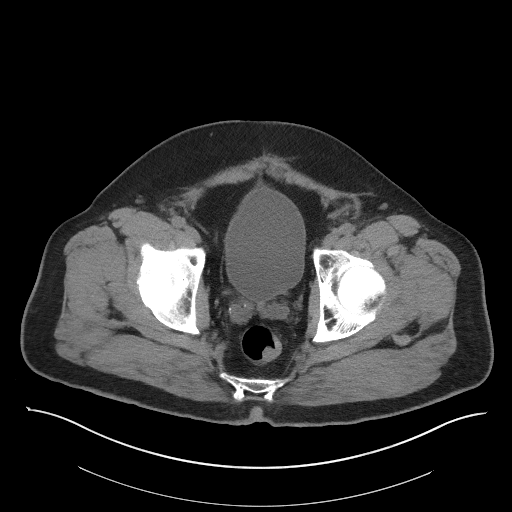
[im 31/104  soft-tissue]
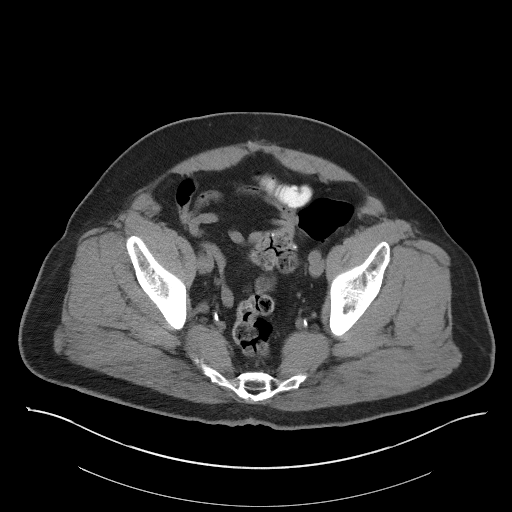
[im 37/104  soft-tissue]
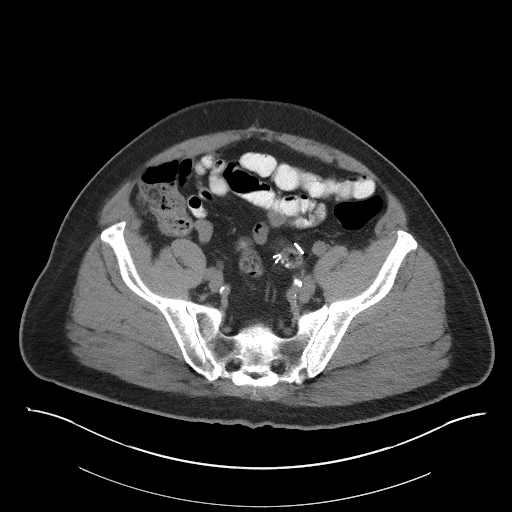
[im 43/104  soft-tissue]
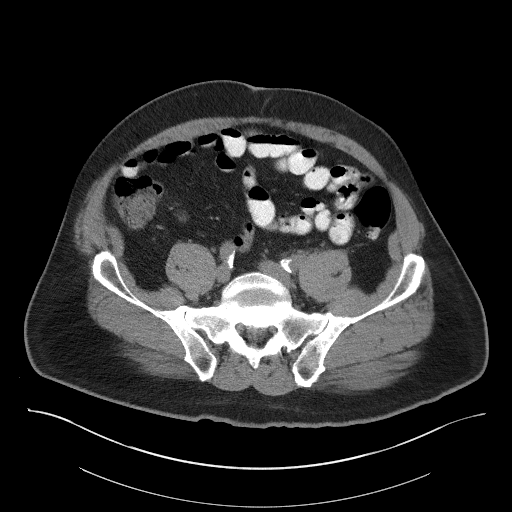
[im 55/104  soft-tissue]
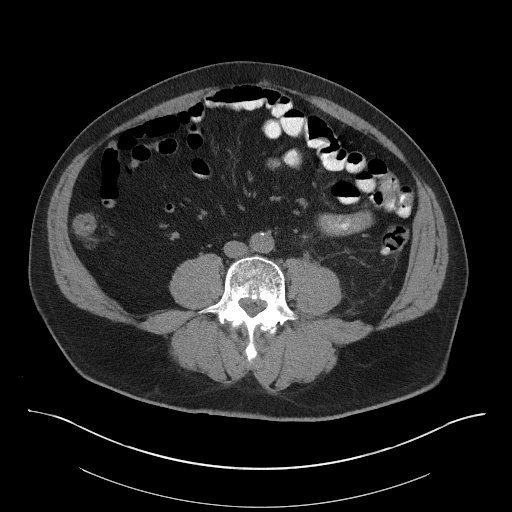
[im 61/104  soft-tissue]
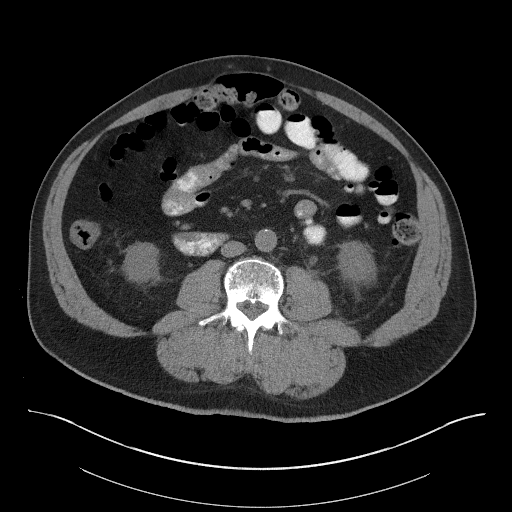
[im 67/104  soft-tissue]
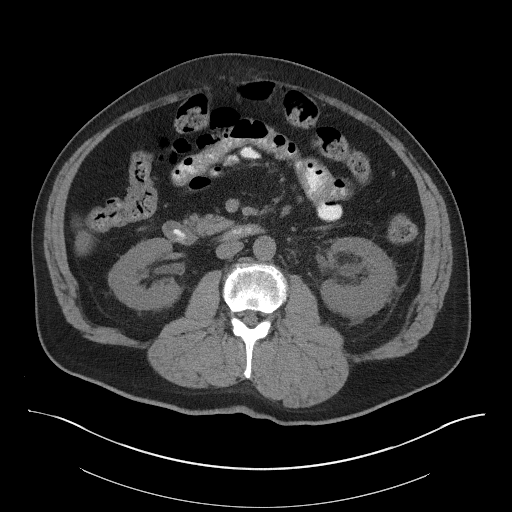
[im 67/104  bone]
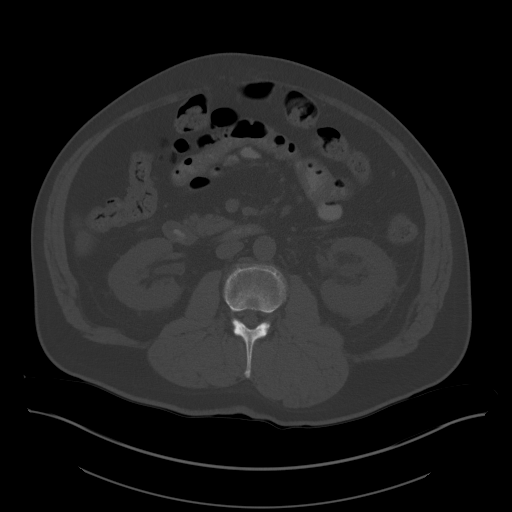
[im 73/104  soft-tissue]
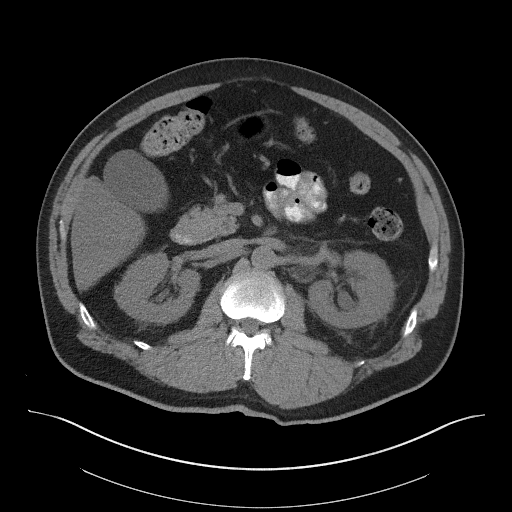
[im 79/104  soft-tissue]
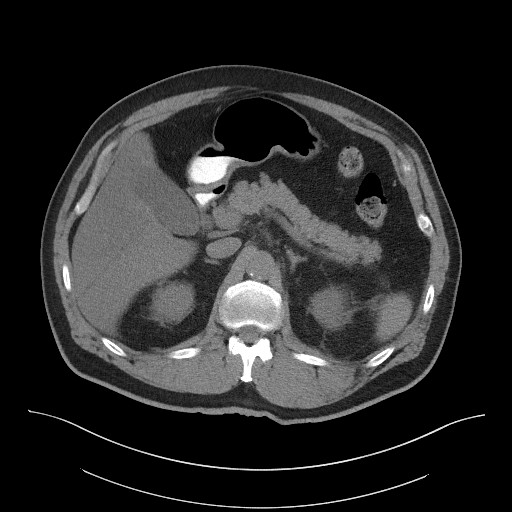
[im 91/104  soft-tissue]
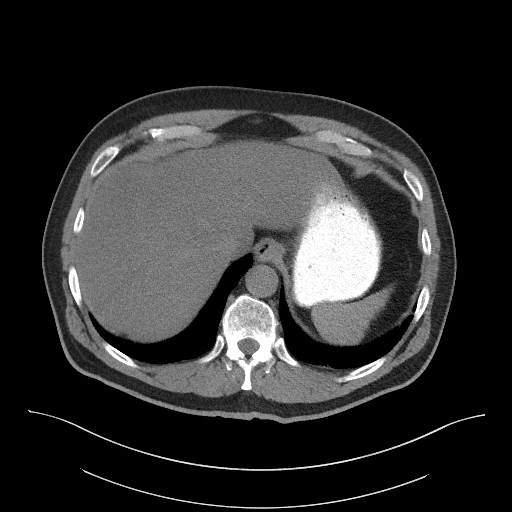
[im 97/104  soft-tissue]
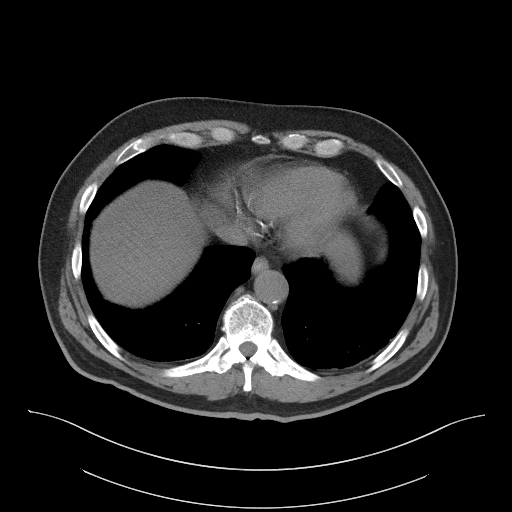

[Series 5: coronal st · coronal · 0.87mm/px · 3 of 119 slices shown]
[im 40/119  soft-tissue]
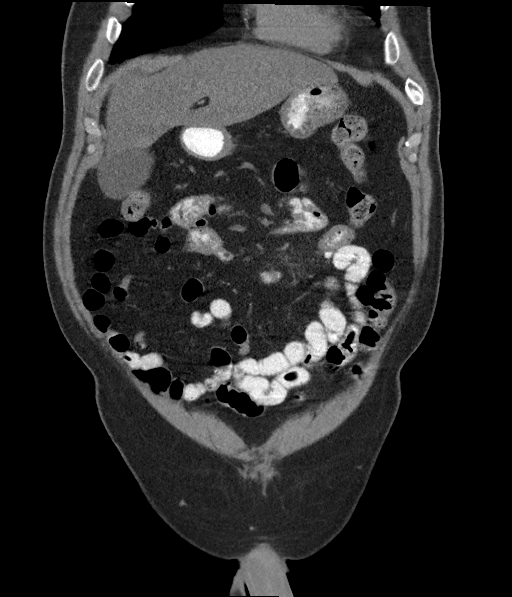
[im 53/119  soft-tissue]
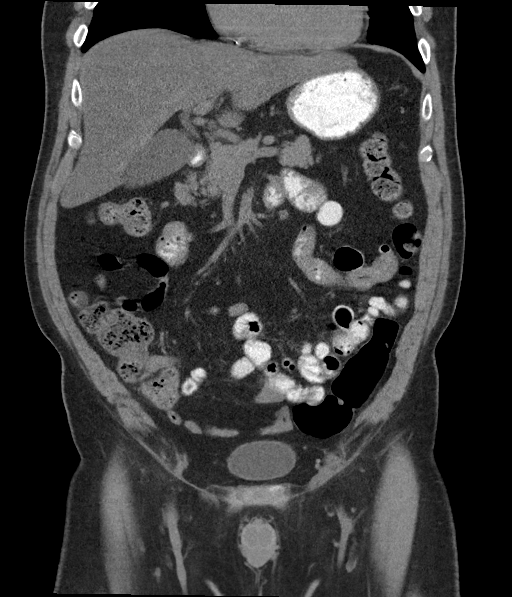
[im 66/119  soft-tissue]
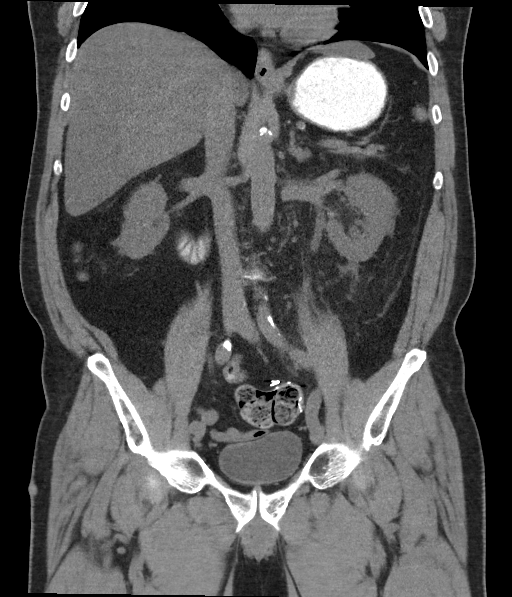

[16 of 46 positions shown; findings below may reference images not displayed]

FINDINGS: Lower chest: Mild bibasilar atelectasis.

Hepatobiliary: Liver is diffusely low in density indicating fatty
infiltration. No focal liver abnormality is seen. Gallbladder is
unremarkable. No bile duct dilatation.

Pancreas: Unremarkable. No pancreatic ductal dilatation or
surrounding inflammatory changes.

Spleen: Normal in size without focal abnormality.

Adrenals/Urinary Tract: Moderate LEFT-sided hydronephrosis. Proximal
LEFT ureteral stone measures 4.5 x 4 mm. No additional ureteral
stone. Bladder is unremarkable, partially decompressed.

2 mm LEFT renal stone. 8 mm and 4 mm nonobstructing RIGHT renal
stones.

Stomach/Bowel: Surgical changes of a partial colectomy at the level
of the sigmoid colon. No dilated large or small bowel loops. Mild
diverticulosis of the LEFT colon without focal inflammation to
suggest acute diverticulitis. No evidence of bowel wall
inflammation. Stomach is unremarkable.

Vascular/Lymphatic: Aortic atherosclerosis. No enlarged lymph nodes
are seen.

Reproductive: Prostate gland is not enlarged.

Other: No free fluid or abscess collection. No free intraperitoneal
air. Midline supraumbilical abdominal wall hernia which contains fat
only.

Musculoskeletal: No acute or suspicious osseous finding. Trabecular
thickening of the LEFT-sided pubic rami and LEFT acetabulum
suggesting some degree of Paget's disease. Mild degenerative
spondylosis of the thoracolumbar spine.
IMPRESSION: 1. Proximal LEFT ureteral stone measures 4.5 x 4 mm, causing
moderate LEFT-sided hydronephrosis and perinephric edema.
2. Bilateral nephrolithiasis.
3. Fatty infiltration of the liver.
4. Colonic diverticulosis without evidence of acute diverticulitis.
5. Midline supraumbilical abdominal wall hernia which contains fat
only.
6. Probable Paget's disease of the LEFT hemipelvis.

Aortic Atherosclerosis (120LP-Y1C.C).

## 2022-09-19 MED ORDER — EZETIMIBE 10 MG PO TABS: 10.0000 mg | ORAL_TABLET | Freq: Every day | ORAL | 1 refills | Status: DC

## 2022-10-02 DIAGNOSIS — Z794 Long term (current) use of insulin: Secondary | ICD-10-CM | POA: Diagnosis not present

## 2022-10-02 DIAGNOSIS — N1832 Chronic kidney disease, stage 3b: Secondary | ICD-10-CM | POA: Diagnosis not present

## 2022-10-02 DIAGNOSIS — E1169 Type 2 diabetes mellitus with other specified complication: Secondary | ICD-10-CM | POA: Diagnosis not present

## 2022-10-02 DIAGNOSIS — R42 Dizziness and giddiness: Secondary | ICD-10-CM | POA: Diagnosis not present

## 2022-10-02 DIAGNOSIS — I129 Hypertensive chronic kidney disease with stage 1 through stage 4 chronic kidney disease, or unspecified chronic kidney disease: Secondary | ICD-10-CM | POA: Diagnosis not present

## 2022-10-02 DIAGNOSIS — I480 Paroxysmal atrial fibrillation: Secondary | ICD-10-CM | POA: Diagnosis not present

## 2022-10-02 DIAGNOSIS — I639 Cerebral infarction, unspecified: Secondary | ICD-10-CM | POA: Diagnosis not present

## 2022-10-02 DIAGNOSIS — E782 Mixed hyperlipidemia: Secondary | ICD-10-CM | POA: Diagnosis not present

## 2022-10-02 DIAGNOSIS — E1122 Type 2 diabetes mellitus with diabetic chronic kidney disease: Secondary | ICD-10-CM | POA: Diagnosis not present

## 2022-10-04 ENCOUNTER — Other Ambulatory Visit: Payer: Self-pay | Admitting: Cardiology

## 2022-11-12 DIAGNOSIS — Z8669 Personal history of other diseases of the nervous system and sense organs: Secondary | ICD-10-CM | POA: Diagnosis not present

## 2022-11-12 DIAGNOSIS — H903 Sensorineural hearing loss, bilateral: Secondary | ICD-10-CM | POA: Diagnosis not present

## 2022-11-12 DIAGNOSIS — R42 Dizziness and giddiness: Secondary | ICD-10-CM | POA: Diagnosis not present

## 2022-11-27 ENCOUNTER — Telehealth: Payer: Self-pay | Admitting: Cardiology

## 2022-11-27 NOTE — Telephone Encounter (Signed)
Pt c/o medication issue:  1. Name of Medication:   dronedarone (MULTAQ) 400 MG tablet    2. How are you currently taking this medication (dosage and times per day)?  Take 1 tablet (400 mg total) by mouth 2 (two) times daily with a meal.       3. Are you having a reaction (difficulty breathing--STAT)? No  4. What is your medication issue? Pt is requesting a callback regarding this medication being delivered by [employer] on 11/14/2022 but no one has called him about coming to pick it up. Pt would like to know is the medication there and ready for pick up. Please advise

## 2022-11-27 NOTE — Telephone Encounter (Signed)
Spoke to patient Shawn Hernandez has been delivered,will be left at front desk for pick up.

## 2022-11-28 ENCOUNTER — Telehealth: Payer: Self-pay | Admitting: Cardiology

## 2022-11-28 NOTE — Telephone Encounter (Signed)
Paper Work Dropped Off: Patient assistance  Date:11/28/2022  Location of paper:  Dr. Tamela Oddi box

## 2022-11-29 ENCOUNTER — Other Ambulatory Visit: Payer: Self-pay

## 2022-11-29 MED ORDER — MULTAQ 400 MG PO TABS: 400.0000 mg | ORAL_TABLET | Freq: Two times a day (BID) | ORAL | 3 refills | Status: AC

## 2022-12-11 DIAGNOSIS — L308 Other specified dermatitis: Secondary | ICD-10-CM | POA: Diagnosis not present

## 2022-12-11 DIAGNOSIS — L820 Inflamed seborrheic keratosis: Secondary | ICD-10-CM | POA: Diagnosis not present

## 2022-12-11 DIAGNOSIS — N481 Balanitis: Secondary | ICD-10-CM | POA: Diagnosis not present

## 2022-12-11 DIAGNOSIS — C44519 Basal cell carcinoma of skin of other part of trunk: Secondary | ICD-10-CM | POA: Diagnosis not present

## 2022-12-11 DIAGNOSIS — L304 Erythema intertrigo: Secondary | ICD-10-CM | POA: Diagnosis not present

## 2022-12-11 DIAGNOSIS — L821 Other seborrheic keratosis: Secondary | ICD-10-CM | POA: Diagnosis not present

## 2022-12-11 DIAGNOSIS — D485 Neoplasm of uncertain behavior of skin: Secondary | ICD-10-CM | POA: Diagnosis not present

## 2022-12-17 ENCOUNTER — Telehealth: Payer: Self-pay

## 2022-12-17 NOTE — Telephone Encounter (Signed)
Called patient left message on personal voice mail patient assistance paperwork completed for Multaq.I will need your proof of income before it can be faxed.Advised to bring a copy of your income to office this week.

## 2022-12-17 NOTE — Telephone Encounter (Signed)
Received a call from patient.He stated he documented his income on the [employer] patient assistance form.He stated proof of income is no longer required.Patient assistance paper work faxed to fax # [phone].

## 2022-12-28 ENCOUNTER — Telehealth: Payer: Self-pay | Admitting: Cardiology

## 2022-12-28 NOTE — Telephone Encounter (Signed)
Spoke to patient he stated Multaq patient assistance will be calling for more information.Stated he did not know what was needed.

## 2022-12-28 NOTE — Telephone Encounter (Signed)
Pt calling in stating pt he want to speak with Elnita Maxwell, RN about his multaq.

## 2022-12-31 NOTE — Telephone Encounter (Signed)
Missing information ICD 10 code faxed to [facility] patient assistance at fax # [phone].

## 2022-12-31 NOTE — Telephone Encounter (Signed)
Elease Hashimoto with [employer] is following up regarding application for assistance with Multaq. She states it was received, but they are missing information from Section 4 on the form. She states this portion needs to be completed in order to process the application. She states they faxed the office regarding this matter on 11/20, and she was following up. She states they are needing an ICD10 code and if questions their call may be returned to the number below.  phone#: [phone]  fax#: [phone]

## 2023-01-15 ENCOUNTER — Telehealth: Payer: Self-pay | Admitting: Cardiology

## 2023-01-15 DIAGNOSIS — I2510 Atherosclerotic heart disease of native coronary artery without angina pectoris: Secondary | ICD-10-CM | POA: Diagnosis not present

## 2023-01-15 DIAGNOSIS — Z125 Encounter for screening for malignant neoplasm of prostate: Secondary | ICD-10-CM | POA: Diagnosis not present

## 2023-01-15 DIAGNOSIS — I129 Hypertensive chronic kidney disease with stage 1 through stage 4 chronic kidney disease, or unspecified chronic kidney disease: Secondary | ICD-10-CM | POA: Diagnosis not present

## 2023-01-15 DIAGNOSIS — E782 Mixed hyperlipidemia: Secondary | ICD-10-CM | POA: Diagnosis not present

## 2023-01-15 DIAGNOSIS — N1832 Chronic kidney disease, stage 3b: Secondary | ICD-10-CM | POA: Diagnosis not present

## 2023-01-15 DIAGNOSIS — R5383 Other fatigue: Secondary | ICD-10-CM | POA: Diagnosis not present

## 2023-01-15 DIAGNOSIS — E1122 Type 2 diabetes mellitus with diabetic chronic kidney disease: Secondary | ICD-10-CM | POA: Diagnosis not present

## 2023-01-15 NOTE — Telephone Encounter (Signed)
Paper Work Dropped Off: [employer] patient assistant paperwork  Date: 01/15/23  Location of paper:  Dr. Swaziland mailbox

## 2023-01-22 DIAGNOSIS — R6889 Other general symptoms and signs: Secondary | ICD-10-CM | POA: Diagnosis not present

## 2023-01-22 DIAGNOSIS — I480 Paroxysmal atrial fibrillation: Secondary | ICD-10-CM | POA: Diagnosis not present

## 2023-01-22 DIAGNOSIS — K439 Ventral hernia without obstruction or gangrene: Secondary | ICD-10-CM | POA: Diagnosis not present

## 2023-01-22 DIAGNOSIS — I700 Atherosclerosis of aorta: Secondary | ICD-10-CM | POA: Diagnosis not present

## 2023-01-22 DIAGNOSIS — Z0000 Encounter for general adult medical examination without abnormal findings: Secondary | ICD-10-CM | POA: Diagnosis not present

## 2023-01-22 DIAGNOSIS — E782 Mixed hyperlipidemia: Secondary | ICD-10-CM | POA: Diagnosis not present

## 2023-01-22 DIAGNOSIS — Z23 Encounter for immunization: Secondary | ICD-10-CM | POA: Diagnosis not present

## 2023-01-22 DIAGNOSIS — E1122 Type 2 diabetes mellitus with diabetic chronic kidney disease: Secondary | ICD-10-CM | POA: Diagnosis not present

## 2023-01-22 DIAGNOSIS — N1832 Chronic kidney disease, stage 3b: Secondary | ICD-10-CM | POA: Diagnosis not present

## 2023-01-22 DIAGNOSIS — I2510 Atherosclerotic heart disease of native coronary artery without angina pectoris: Secondary | ICD-10-CM | POA: Diagnosis not present

## 2023-01-22 DIAGNOSIS — L4050 Arthropathic psoriasis, unspecified: Secondary | ICD-10-CM | POA: Diagnosis not present

## 2023-01-22 DIAGNOSIS — D692 Other nonthrombocytopenic purpura: Secondary | ICD-10-CM | POA: Diagnosis not present

## 2023-01-25 ENCOUNTER — Telehealth: Payer: Self-pay | Admitting: Cardiology

## 2023-01-25 NOTE — Telephone Encounter (Signed)
Spoke to patient he stated [employer] patient assistance did not require proof of income.Advised patient assistance forms completed and faxed to fax # [phone].

## 2023-01-25 NOTE — Telephone Encounter (Signed)
See 12/20 telephone note.

## 2023-01-25 NOTE — Telephone Encounter (Signed)
Patient stated he is returning RN Cheryl's call.

## 2023-02-13 DIAGNOSIS — K432 Incisional hernia without obstruction or gangrene: Secondary | ICD-10-CM | POA: Diagnosis not present

## 2023-02-22 ENCOUNTER — Other Ambulatory Visit: Payer: Self-pay | Admitting: Cardiology

## 2023-02-22 DIAGNOSIS — I480 Paroxysmal atrial fibrillation: Secondary | ICD-10-CM

## 2023-02-22 DIAGNOSIS — I4892 Unspecified atrial flutter: Secondary | ICD-10-CM

## 2023-02-22 NOTE — Telephone Encounter (Signed)
Eliquis 5mg  refill request received. Patient is 73 years old, weight-96.5kg, Crea-1.48 on 01/15/23 via [employer] from PCP, Diagnosis-Aflutter/fib, and last seen by Dr. Swaziland on 04/03/22. Dose is appropriate based on dosing criteria. Will send in refill to requested pharmacy.

## 2023-03-25 ENCOUNTER — Telehealth: Payer: Self-pay | Admitting: Pharmacist

## 2023-03-25 NOTE — Telephone Encounter (Signed)
Called patient and let him know Multaq patient assistance has come in. Will be at front desk.

## 2023-03-26 ENCOUNTER — Other Ambulatory Visit: Payer: Self-pay | Admitting: Cardiology

## 2023-03-28 ENCOUNTER — Telehealth: Payer: Self-pay | Admitting: Cardiology

## 2023-03-28 NOTE — Telephone Encounter (Signed)
Spoke with pt, he is aware of the refill policy and the need to make a follow up appointment. He was offered an appointment next week but he declined. He would like an appointment on 05/02/23 when he will be in [place]. Aware dr Elvis Coil schedule is full and I can not add him on that day. He would l;ike cheryl to call him back about being added to that schedule. Aware cheryl is not in the office today but will have her call him once she returns.

## 2023-03-28 NOTE — Telephone Encounter (Signed)
Pt c/o medication issue:  1. Name of Medication:   ezetimibe (ZETIA) 10 MG tablet   2. How are you currently taking this medication (dosage and times per day)? As prescribed  3. Are you having a reaction (difficulty breathing--STAT)?   4. What is your medication issue?    Patient state he is concerned about getting his medication refilled without a required appointment.

## 2023-03-28 NOTE — Telephone Encounter (Signed)
Follow Up:      Patient is retuning call from today.

## 2023-03-28 NOTE — Telephone Encounter (Signed)
Attempted to call patient, no answer. Left message informing patient Zetia Rx sent to pharmacy, he will need to schedule future appointment with Dr. Swaziland or APP for additional refills.

## 2023-03-29 ENCOUNTER — Telehealth: Payer: Self-pay | Admitting: Cardiology

## 2023-03-29 NOTE — Telephone Encounter (Signed)
Spoke to patient follow up appointment scheduled with Dr.Jordan 3/27 at 11:40 am.

## 2023-03-29 NOTE — Telephone Encounter (Signed)
Pt would like a c/b regarding appt on 3/27. Please advise

## 2023-03-29 NOTE — Telephone Encounter (Signed)
Called patient. He reports he would like Shawn Hernandez to call him to work him in on 3/27 for a visit with Dr. [place].   He said he has another appt in [place] on 3/27 and would prefer to only make one trip to [facility] note, recall was sent 12/2022 for 03/2023.   Message routed to [facility] LPN for assistance with his request.

## 2023-04-10 ENCOUNTER — Other Ambulatory Visit: Payer: Self-pay | Admitting: Cardiology

## 2023-04-28 NOTE — Progress Notes (Unsigned)
 Cardiology Office Note:    Date:  05/02/2023   ID:  Shawn Hernandez, DOB November 11, 1950, MRN [id number]  PCP:  Shawn Fick, MD   [facility] Providers Cardiologist:  Shawn Wachsmuth [place], MD Electrophysiologist:  Shawn Lemming, MD     Referring MD: Shawn Fick, MD   Chief Complaint  Patient presents with   Atrial Fibrillation    History of Present Illness:    Shawn Hernandez is a 73 y.o. male with a hx of CAD, hypertension, hyperlipidemia, DM2, atrial flutter and history of psoriatic arthritis.  He was seen in March 2022 for evaluation of dizziness and palpitation.  Prior to that, patient had a bare-metal stent to OM in 2010 with residual 50% LAD and a 70% RCA lesion.  History of  intolerance of statins.  He had atrial flutter ablation in September 2019, on follow-up he had paroxysmal atrial fibrillation and was placed on Multaq.   When I saw him in 2022 his  Multaq was discontinued given lack of recurrence for A-fib in over 2 years.  He unfortunately had tachypalpitations after coming off of Multaq, and he decided to resume the medication.   He eventually decided to continue on Multaq.    On follow up he continues to do well from a cardiac standpoint. No chest pain or palpitations. Has chronic dizziness that has not changed. Generally feels well.   Past Medical History:  Diagnosis Date   Allergy to iodine    Arthritis    discomfort in hands and arms   Atrial flutter, paroxysmal (HCC)    Coronary artery disease    Diabetes type 2, controlled (HCC) 10/2016   Diverticulosis    Dysrhythmia     A flutter   History of kidney stones    Hypercholesterolemia    Hypertension    Psoriatic arthritis (HCC)    Sinusitis    Vestibular neuronitis     Past Surgical History:  Procedure Laterality Date   A-FLUTTER ABLATION N/A 10/17/2017   Procedure: A-FLUTTER ABLATION;  Surgeon: Shawn Lemming, MD;  Location: [facility];  Service: Cardiovascular;   Laterality: N/A;   COLON SURGERY  2001   2 feet removed re- diverticulosis   CORONARY STENT PLACEMENT  2011   sequential bare-metal   CYSTOSCOPY W/ URETERAL STENT PLACEMENT Bilateral 02/05/2020   Procedure: CYSTOSCOPY WITH RETROGRADE PYELOGRAM/URETERAL STENT PLACEMENT;  Surgeon: Shawn Ache, MD;  Location: [facility];  Service: Urology;  Laterality: Bilateral;   CYSTOSCOPY WITH RETROGRADE PYELOGRAM, URETEROSCOPY AND STENT PLACEMENT Bilateral 03/02/2020   Procedure: CYSTOSCOPY WITH RETROGRADE PYELOGRAM, URETEROSCOPY,AND STENT EXCHANGE;  Surgeon: Shawn Ache, MD;  Location: [facility];  Service: Urology;  Laterality: Bilateral;  75 MINS   HOLMIUM LASER APPLICATION Bilateral 03/02/2020   Procedure: HOLMIUM LASER APPLICATION;  Surgeon: Shawn Ache, MD;  Location: [facility];  Service: Urology;  Laterality: Bilateral;   INGUINAL HERNIA REPAIR     OTHER SURGICAL HISTORY     appendectomy    Current Medications: Current Meds  Medication Sig   Continuous Glucose Sensor (FREESTYLE LIBRE 2 SENSOR) MISC CHANGE SENSOR EVERY 14 DAYS   dapagliflozin propanediol (FARXIGA) 10 MG TABS tablet Take 1 tablet by mouth daily.   diphenhydrAMINE (BENADRYL) 25 MG tablet Take 25 mg by mouth daily as needed for allergies.   dronedarone (MULTAQ) 400 MG tablet Take 1 tablet (400 mg total) by mouth 2 (two) times daily with a meal.   ELIQUIS 5 MG TABS tablet TAKE 1 TABLET(5 MG)  BY MOUTH TWICE DAILY   ezetimibe (ZETIA) 10 MG tablet Take 1 tablet (10 mg total) by mouth daily. Patient must schedule annual appointment for further refills   rosuvastatin (CRESTOR) 5 MG tablet TAKE 1 TABLET(5 MG) BY MOUTH DAILY   Semaglutide (OZEMPIC, 1 MG/DOSE, [place]) Inject into the skin once a week.     Allergies:   Ivp dye [iodinated contrast media], Lisinopril, Statins, and Iodine   Social History   Socioeconomic History   Marital status: Single    Spouse name: Not on file   Number of children: 5   Years of education: Not on file    Highest education level: Not on file  Occupational History    Employer: [employer]  Tobacco Use   Smoking status: Never   Smokeless tobacco: Never  Vaping Use   Vaping status: Never Used  Substance and Sexual Activity   Alcohol use: Not Currently   Drug use: Never   Sexual activity: Not on file  Other Topics Concern   Not on file  Social History Narrative   Lives alone   Caffeine-1 coffee   Social Drivers of Health   Financial Resource Strain: Not on file  Food Insecurity: Not on file  Transportation Needs: Not on file  Physical Activity: Not on file  Stress: Not on file  Social Connections: Not on file     Family History: The patient's family history includes Diabetes in his brother and father; Heart disease in his brother, father, and mother; Hypertension in his father and mother; Stroke in his mother.  ROS:   Please see the history of present illness.     All other systems reviewed and are negative.  EKGs/Labs/Other Studies Reviewed:    The following studies were reviewed today:  Myoview 07/23/2017 The left ventricular ejection fraction is mildly decreased (45-54%). Nuclear stress EF: 54%. There was no ST segment deviation noted during stress. This is a low risk study.   1. EF 54%, low normal to mildly reduced.  2. No evidence for ischemia or infarction on perfusion images.    Low risk study.      Recent Labs: No results found for requested labs within last 365 days.  Recent Lipid Panel    Component Value Date/Time   CHOL 173 10/20/2016 0226   TRIG 201 (H) 10/20/2016 0226   HDL 29 (L) 10/20/2016 0226   CHOLHDL 6.0 10/20/2016 0226   VLDL 40 10/20/2016 0226   LDLCALC 104 (H) 10/20/2016 0226   LDLDIRECT 107.6 08/10/2011 1136  Dated 06/26/22: cholesterol 102, triglycerides 126, HDL 36, LDL 56 Dated 01/15/23: A1c 6.2%.    EKG Interpretation Date/Time:  Thursday May 02 2023 11:58:42 EDT Ventricular Rate:  60 PR Interval:  190 QRS Duration:  88 QT  Interval:  426 QTC Calculation: 426 R Axis:   -32  Text Interpretation: Normal sinus rhythm Left axis deviation When compared with ECG of 20-Dec-2020 10:00, Nonspecific T wave abnormality, improved in Lateral leads Confirmed by Hernandez, Shawn Hernandez [phone]) on 05/02/2023 12:05:44 PM   Risk Assessment/Calculations:    CHA2DS2-VASc Score =     This indicates a  % annual risk of stroke. The patient's score is based upon:            Physical Exam:    VS:  BP 120/66   Pulse 60   Ht 6' (1.829 m)   Wt 215 lb 3.2 oz (97.6 kg)   SpO2 96%   BMI 29.19 kg/m  Wt Readings from Last 3 Encounters:  05/02/23 215 lb 3.2 oz (97.6 kg)  04/03/22 212 lb 12.8 oz (96.5 kg)  10/03/21 231 lb (104.8 kg)    GEN:  Well nourished, well developed in no acute distress HEENT: Normal NECK: No JVD; No carotid bruits LYMPHATICS: No lymphadenopathy CARDIAC: RRR, no murmurs, rubs, gallops RESPIRATORY:  Clear to auscultation without rales, wheezing or rhonchi  ABDOMEN: Soft, non-tender, non-distended MUSCULOSKELETAL:  No edema; No deformity  SKIN: Warm and dry NEUROLOGIC:  Alert and oriented x 3 PSYCHIATRIC:  Normal affect   ASSESSMENT:    1. Coronary artery disease involving native coronary artery of native heart, unspecified whether angina present   2. Paroxysmal atrial fibrillation (HCC)   3. Dizziness   4. Hyperlipidemia LDL goal <70      PLAN:    In order of problems listed above:  Paroxysmal Atrial fibrillation: On Eliquis and Multaq. Excellent control  CAD: no anginal symptoms. Not on ASA since on Eliquis. On statin.   Hypertension: Blood pressure is good on no meds.   Hyperlipidemia: On Zetia and low-dose Crestor. LDL 56  DM2: Managed by primary care provider.  A1c 6.3%  6.   Dizziness. Chronic            Medication Adjustments/Labs and Tests Ordered: Current medicines are reviewed at length with the patient today.  Concerns regarding medicines are outlined above.  Orders  Placed This Encounter  Procedures   EKG 12-Lead   No orders of the defined types were placed in this encounter.   Patient Instructions  Medication Instructions:   *If you need a refill on your cardiac medications before your next appointment, please call your pharmacy*   Lab Work:  If you have labs (blood work) drawn today and your tests are completely normal, you will receive your results only by: MyChart Message (if you have MyChart) OR A paper copy in the mail If you have any lab test that is abnormal or we need to change your treatment, we will call you to review the results.   Testing/Procedures:    Follow-Up: At [facility], you and your health needs are our priority.  As part of our continuing mission to provide you with exceptional heart care, we have created designated Provider Care Teams.  These Care Teams include your primary Cardiologist (physician) and Advanced Practice Providers (APPs -  Physician Assistants and Nurse Practitioners) who all work together to provide you with the care you need, when you need it.  We recommend signing up for the patient portal called "MyChart".  Sign up information is provided on this After Visit Summary.  MyChart is used to connect with patients for Virtual Visits (Telemedicine).  Patients are able to view lab/test results, encounter notes, upcoming appointments, etc.  Non-urgent messages can be sent to your provider as well.   To learn more about what you can do with MyChart, go to [email or web address].    Your next appointment:   12 month(s)  Provider:   Samira Acero [place], MD   Other Instructions        Signed, Suhaib Guzzo [place], MD  05/02/2023 12:18 PM    [facility]

## 2023-05-02 ENCOUNTER — Ambulatory Visit: Payer: PPO | Attending: Cardiology | Admitting: Cardiology

## 2023-05-02 ENCOUNTER — Encounter: Payer: Self-pay | Admitting: Cardiology

## 2023-05-02 VITALS — BP 120/66 | HR 60 | Ht 72.0 in | Wt 215.2 lb

## 2023-05-02 DIAGNOSIS — E785 Hyperlipidemia, unspecified: Secondary | ICD-10-CM | POA: Diagnosis not present

## 2023-05-02 DIAGNOSIS — R42 Dizziness and giddiness: Secondary | ICD-10-CM | POA: Diagnosis not present

## 2023-05-02 DIAGNOSIS — E1169 Type 2 diabetes mellitus with other specified complication: Secondary | ICD-10-CM | POA: Diagnosis not present

## 2023-05-02 DIAGNOSIS — I480 Paroxysmal atrial fibrillation: Secondary | ICD-10-CM | POA: Diagnosis not present

## 2023-05-02 DIAGNOSIS — E782 Mixed hyperlipidemia: Secondary | ICD-10-CM | POA: Diagnosis not present

## 2023-05-02 DIAGNOSIS — Z8709 Personal history of other diseases of the respiratory system: Secondary | ICD-10-CM | POA: Diagnosis not present

## 2023-05-02 DIAGNOSIS — I129 Hypertensive chronic kidney disease with stage 1 through stage 4 chronic kidney disease, or unspecified chronic kidney disease: Secondary | ICD-10-CM | POA: Diagnosis not present

## 2023-05-02 DIAGNOSIS — I639 Cerebral infarction, unspecified: Secondary | ICD-10-CM | POA: Diagnosis not present

## 2023-05-02 DIAGNOSIS — E1122 Type 2 diabetes mellitus with diabetic chronic kidney disease: Secondary | ICD-10-CM | POA: Diagnosis not present

## 2023-05-02 DIAGNOSIS — I2510 Atherosclerotic heart disease of native coronary artery without angina pectoris: Secondary | ICD-10-CM | POA: Diagnosis not present

## 2023-05-02 NOTE — Patient Instructions (Signed)
Medication Instructions:  Continue same medications *If you need a refill on your cardiac medications before your next appointment, please call your pharmacy*   Lab Work: None ordered   Testing/Procedures: None  ordered   Follow-Up: At [facility], you and your health needs are our priority.  As part of our continuing mission to provide you with exceptional heart care, we have created designated Provider Care Teams.  These Care Teams include your primary Cardiologist (physician) and Advanced Practice Providers (APPs -  Physician Assistants and Nurse Practitioners) who all work together to provide you with the care you need, when you need it.  We recommend signing up for the patient portal called "MyChart".  Sign up information is provided on this After Visit Summary.  MyChart is used to connect with patients for Virtual Visits (Telemedicine).  Patients are able to view lab/test results, encounter notes, upcoming appointments, etc.  Non-urgent messages can be sent to your provider as well.   To learn more about what you can do with MyChart, go to [email or web address].    Your next appointment:  1 year     Call in Dec to schedule March appointment    Provider:  Dr.Jordan

## 2023-05-27 ENCOUNTER — Other Ambulatory Visit: Payer: Self-pay | Admitting: Cardiology

## 2023-05-30 ENCOUNTER — Telehealth: Payer: Self-pay | Admitting: Cardiology

## 2023-05-30 MED ORDER — EZETIMIBE 10 MG PO TABS: 10.0000 mg | ORAL_TABLET | Freq: Every day | ORAL | 3 refills | Status: DC

## 2023-05-30 NOTE — Telephone Encounter (Signed)
*  STAT* If patient is at the pharmacy, call can be transferred to refill team.   1. Which medications need to be refilled? (please list name of each medication and dose if known)   ezetimibe  (ZETIA ) 10 MG tablet    2. Which pharmacy/location (including street and city if local pharmacy) is medication to be sent to?  [facility] [phone] - [place], [place] - [address] AT [facility] [address]      3. Do they need a 30 day or 90 day supply? 90 DAY    Pt is out of medication

## 2023-05-30 NOTE — Telephone Encounter (Signed)
 Pt's medication was sent to pt's pharmacy as requested. Confirmation received.

## 2023-06-10 ENCOUNTER — Telehealth: Payer: Self-pay | Admitting: Cardiology

## 2023-06-10 NOTE — Telephone Encounter (Signed)
 Patient called wanting to know if medication dronedarone  (MULTAQ ) 400 MG tablet, has arrived yet from [employer].

## 2023-06-17 ENCOUNTER — Telehealth: Payer: Self-pay

## 2023-06-17 NOTE — Telephone Encounter (Signed)
 Received, thank you

## 2023-06-17 NOTE — Telephone Encounter (Signed)
 FORMS IN [place], REVIEWING

## 2023-06-18 ENCOUNTER — Other Ambulatory Visit (HOSPITAL_COMMUNITY): Payer: Self-pay

## 2023-06-18 NOTE — Telephone Encounter (Signed)
 ADDRESS CHANGE FORM COMPLETED AND FAXED TO [facility].

## 2023-06-18 NOTE — Telephone Encounter (Signed)
 Multaq  patient assistance arrived. Contacted patient and he is aware

## 2023-07-10 ENCOUNTER — Other Ambulatory Visit: Payer: Self-pay

## 2023-07-10 MED ORDER — ROSUVASTATIN CALCIUM 5 MG PO TABS: ORAL_TABLET | ORAL | 3 refills | Status: DC

## 2023-07-30 DIAGNOSIS — E782 Mixed hyperlipidemia: Secondary | ICD-10-CM | POA: Diagnosis not present

## 2023-07-30 DIAGNOSIS — I480 Paroxysmal atrial fibrillation: Secondary | ICD-10-CM | POA: Diagnosis not present

## 2023-07-30 DIAGNOSIS — N1832 Chronic kidney disease, stage 3b: Secondary | ICD-10-CM | POA: Diagnosis not present

## 2023-07-30 DIAGNOSIS — I129 Hypertensive chronic kidney disease with stage 1 through stage 4 chronic kidney disease, or unspecified chronic kidney disease: Secondary | ICD-10-CM | POA: Diagnosis not present

## 2023-07-30 DIAGNOSIS — I2510 Atherosclerotic heart disease of native coronary artery without angina pectoris: Secondary | ICD-10-CM | POA: Diagnosis not present

## 2023-07-30 DIAGNOSIS — E1122 Type 2 diabetes mellitus with diabetic chronic kidney disease: Secondary | ICD-10-CM | POA: Diagnosis not present

## 2023-08-06 DIAGNOSIS — I700 Atherosclerosis of aorta: Secondary | ICD-10-CM | POA: Diagnosis not present

## 2023-08-06 DIAGNOSIS — N1832 Chronic kidney disease, stage 3b: Secondary | ICD-10-CM | POA: Diagnosis not present

## 2023-08-06 DIAGNOSIS — D692 Other nonthrombocytopenic purpura: Secondary | ICD-10-CM | POA: Diagnosis not present

## 2023-08-06 DIAGNOSIS — R5383 Other fatigue: Secondary | ICD-10-CM | POA: Diagnosis not present

## 2023-08-06 DIAGNOSIS — I480 Paroxysmal atrial fibrillation: Secondary | ICD-10-CM | POA: Diagnosis not present

## 2023-08-06 DIAGNOSIS — I129 Hypertensive chronic kidney disease with stage 1 through stage 4 chronic kidney disease, or unspecified chronic kidney disease: Secondary | ICD-10-CM | POA: Diagnosis not present

## 2023-08-06 DIAGNOSIS — L4050 Arthropathic psoriasis, unspecified: Secondary | ICD-10-CM | POA: Diagnosis not present

## 2023-08-06 DIAGNOSIS — I2510 Atherosclerotic heart disease of native coronary artery without angina pectoris: Secondary | ICD-10-CM | POA: Diagnosis not present

## 2023-08-06 DIAGNOSIS — E782 Mixed hyperlipidemia: Secondary | ICD-10-CM | POA: Diagnosis not present

## 2023-08-06 DIAGNOSIS — E1122 Type 2 diabetes mellitus with diabetic chronic kidney disease: Secondary | ICD-10-CM | POA: Diagnosis not present

## 2023-08-12 ENCOUNTER — Telehealth: Payer: Self-pay | Admitting: Pharmacy Technician

## 2023-08-14 ENCOUNTER — Telehealth: Payer: Self-pay | Admitting: Cardiology

## 2023-08-14 DIAGNOSIS — I4892 Unspecified atrial flutter: Secondary | ICD-10-CM

## 2023-08-14 DIAGNOSIS — I480 Paroxysmal atrial fibrillation: Secondary | ICD-10-CM

## 2023-08-14 MED ORDER — APIXABAN 5 MG PO TABS: 5.0000 mg | ORAL_TABLET | Freq: Two times a day (BID) | ORAL | 1 refills | Status: DC

## 2023-08-14 NOTE — Telephone Encounter (Signed)
 Prescription refill request for Eliquis  received. Indication: Aflutter Last office visit: 05/02/23 ([place])  Scr: 1.47 (07/30/23 via [facility])  Age: 73 Weight: 97.6kg

## 2023-08-14 NOTE — Telephone Encounter (Signed)
*  STAT* If patient is at the pharmacy, call can be transferred to refill team.   1. Which medications need to be refilled? (please list name of each medication and dose if known)   ELIQUIS  5 MG TABS tablet   2. Would you like to learn more about the convenience, safety, & potential cost savings by using the [facility]?   3. Are you open to using the [facility] (Type [facility]. ).  4. Which pharmacy/location (including street and city if local pharmacy) is medication to be sent to?  [facility] - [place], [place] - [address]   5. Do they need a 30 day or 90 day supply?   90 day   Patient stated he still has some medication left.

## 2023-08-26 ENCOUNTER — Telehealth: Payer: Self-pay | Admitting: Cardiology

## 2023-08-26 MED ORDER — EZETIMIBE 10 MG PO TABS: 10.0000 mg | ORAL_TABLET | Freq: Every day | ORAL | 3 refills | Status: AC

## 2023-08-26 NOTE — Telephone Encounter (Signed)
 Refills has been sent to the pharmacy.

## 2023-08-26 NOTE — Telephone Encounter (Signed)
*  STAT* If patient is at the pharmacy, call can be transferred to refill team.   1. Which medications need to be refilled? (please list name of each medication and dose if known)   ezetimibe  (ZETIA ) 10 MG tablet   2. Would you like to learn more about the convenience, safety, & potential cost savings by using the [facility]?   3. Are you open to using the [facility] (Type [facility]. ).  4. Which pharmacy/location (including street and city if local pharmacy) is medication to be sent to?  [facility] - [place], [place] - [address]   5. Do they need a 30 day or 90 day supply? 90 day  Patient stated he has 3 tablets left.

## 2023-09-02 ENCOUNTER — Telehealth: Payer: Self-pay | Admitting: Cardiology

## 2023-09-02 NOTE — Telephone Encounter (Signed)
 Pt called and stated that he thinks his dronedarone  (MULTAQ ) 400 MG table   Best number [phone]

## 2023-09-02 NOTE — Telephone Encounter (Signed)
 Called patient about message. Patient is wanting to get his multaq . Will send message to Dr. Gib nurse and pharmacy.

## 2023-09-03 NOTE — Telephone Encounter (Signed)
 Contacted patient to let him know Multaq  patient assistance has been delivered

## 2023-10-28 ENCOUNTER — Telehealth: Payer: Self-pay | Admitting: Pharmacy Technician

## 2023-11-12 DIAGNOSIS — I480 Paroxysmal atrial fibrillation: Secondary | ICD-10-CM | POA: Diagnosis not present

## 2023-11-12 DIAGNOSIS — E1122 Type 2 diabetes mellitus with diabetic chronic kidney disease: Secondary | ICD-10-CM | POA: Diagnosis not present

## 2023-11-12 DIAGNOSIS — I129 Hypertensive chronic kidney disease with stage 1 through stage 4 chronic kidney disease, or unspecified chronic kidney disease: Secondary | ICD-10-CM | POA: Diagnosis not present

## 2023-11-12 DIAGNOSIS — E782 Mixed hyperlipidemia: Secondary | ICD-10-CM | POA: Diagnosis not present

## 2023-11-13 NOTE — Telephone Encounter (Signed)
 Called and LVM per DPR that Multaq  has arrived and he can pick it up on 5th floor.

## 2023-11-14 ENCOUNTER — Telehealth: Payer: Self-pay | Admitting: Cardiology

## 2023-11-14 NOTE — Telephone Encounter (Signed)
 Pt would like a referral to Vascular Doctor for shooting pain in the neck to top of the head. Please advise      Trying to find out what is causing this- has had 2 episodes in the past 2 weeks. Has happened once on each side. Shooting pain that goes from shoulder blade area/back, then goes up the neck then to the head- it stings very bad at the top of skull- lasts about 10 seconds.   Informed him that I will give this information to his provider and we will call him back with recommendations. He verbalized understanding.

## 2023-11-14 NOTE — Telephone Encounter (Signed)
 [place], Peter M, MD to Shawn Hernandez (Selected Message)     11/14/23  3:49 PM This does not sound vascular in nature. I would have him discuss with PCP.   Peter [place] MD, [facility]  S/w the patient and gave him the information above. He verbalized understanding.

## 2023-11-14 NOTE — Telephone Encounter (Signed)
 Pt would like a referral to Vascular Doctor for shooting pain in the neck to top of the head. Please advise

## 2023-12-05 DIAGNOSIS — C44519 Basal cell carcinoma of skin of other part of trunk: Secondary | ICD-10-CM | POA: Diagnosis not present

## 2023-12-05 DIAGNOSIS — C44311 Basal cell carcinoma of skin of nose: Secondary | ICD-10-CM | POA: Diagnosis not present

## 2023-12-05 DIAGNOSIS — D485 Neoplasm of uncertain behavior of skin: Secondary | ICD-10-CM | POA: Diagnosis not present

## 2023-12-05 DIAGNOSIS — L821 Other seborrheic keratosis: Secondary | ICD-10-CM | POA: Diagnosis not present

## 2023-12-05 DIAGNOSIS — C44612 Basal cell carcinoma of skin of right upper limb, including shoulder: Secondary | ICD-10-CM | POA: Diagnosis not present

## 2023-12-19 ENCOUNTER — Telehealth: Payer: Self-pay

## 2023-12-19 NOTE — Telephone Encounter (Signed)
 Please help support patient with the MULTAQ  PAP process by filling out the provider portion on chart media. Please ensure that the provider portion has been signed and dated (no stamps). Please fax the completed provider page to [phone].

## 2024-01-06 ENCOUNTER — Telehealth: Payer: Self-pay

## 2024-01-06 NOTE — Telephone Encounter (Signed)
 [employer] patient application for Multaq  completed and signed by Dr.Jordan.Form faxed to [employer] at fax # [phone].

## 2024-01-07 DIAGNOSIS — Z85828 Personal history of other malignant neoplasm of skin: Secondary | ICD-10-CM | POA: Diagnosis not present

## 2024-01-07 DIAGNOSIS — C44311 Basal cell carcinoma of skin of nose: Secondary | ICD-10-CM | POA: Diagnosis not present

## 2024-01-09 NOTE — Telephone Encounter (Signed)
 Provider form received on PAP fax on 01/06/24. Form has been reviewed for accuracy and faxed to PAP company. Status is pending.

## 2024-01-10 NOTE — Telephone Encounter (Signed)
 Reviewed provider form from previous fax. NPI was included. Refaxed.

## 2024-01-10 NOTE — Telephone Encounter (Signed)
Missing info

## 2024-01-14 NOTE — Telephone Encounter (Signed)
 Staff onsite again on 01/15/24 and will mail another form (page 2) to patient.

## 2024-01-14 NOTE — Telephone Encounter (Signed)
 Company called to say they didn't received the 2 page of the form and asking that it be refax to [phone] . Please advise

## 2024-01-14 NOTE — Telephone Encounter (Signed)
 Paper Work Dropped Off: At [employer] application for a med    Date:01/14/2024  Location of paper: In Provider box

## 2024-01-14 NOTE — Telephone Encounter (Signed)
 Clinic completed provider portion for Multaq  and faxed to [employer]. Page 2 is the patient portion that patient has to complete and send back to company. Page 2 was mailed to pt to complete, we have not received the patient portion.

## 2024-01-16 ENCOUNTER — Other Ambulatory Visit: Payer: Self-pay | Admitting: Cardiology

## 2024-01-16 NOTE — Telephone Encounter (Signed)
 Received Multaq  patient assistance paperwork from patient.Form completed and faxed to [employer] at fax # [phone].

## 2024-02-07 ENCOUNTER — Other Ambulatory Visit: Payer: Self-pay | Admitting: Cardiology

## 2024-02-07 DIAGNOSIS — I480 Paroxysmal atrial fibrillation: Secondary | ICD-10-CM

## 2024-02-07 NOTE — Telephone Encounter (Signed)
 Prescription refill request for Eliquis  received. Indication: A-Fib Last office visit: 05/02/23 Scr: 1.63 08/10/21 Care Everywhere Age: 74 Weight: 97.6 KG Pt has Passed Parameters. Dose is Good. No labs since 2023

## 2024-02-07 NOTE — Telephone Encounter (Signed)
 PT calling to check on refill. PT is currently out of meds. Requesting a 90 day.

## 2024-02-07 NOTE — Telephone Encounter (Signed)
 Scr 1.4 [facility] 02/05/24

## 2024-02-10 ENCOUNTER — Other Ambulatory Visit (HOSPITAL_COMMUNITY): Payer: Self-pay

## 2024-02-10 ENCOUNTER — Telehealth: Payer: Self-pay | Admitting: Cardiology

## 2024-02-10 ENCOUNTER — Telehealth: Payer: Self-pay | Admitting: Pharmacy Technician

## 2024-02-10 NOTE — Telephone Encounter (Signed)
 Patient calling the office for samples of medication:   1.  What medication and dosage are you requesting samples for? apixaban (ELIQUIS) 5 MG TABS tablet  2.  Are you currently out of this medication? Yes

## 2024-02-10 NOTE — Telephone Encounter (Signed)
 [facility] and they said it was 149.00 for 90 days or 49.80 for 30 days  Xarelto would be 41.33 for 30 days per test claim   Dabigatran would be 56.15 for 30 days on ins or 56.66 on goodrx  He doesn't have cardiomyopathy   [employer] requires 3 percent of income to be paid before approving assistance  Vicci and vicci does not   I called and left a message for the patient to call back to see if 30 days of eliquis  is affordable.

## 2024-02-14 NOTE — Telephone Encounter (Signed)
 I called the patient and left another message about below

## 2024-02-24 ENCOUNTER — Telehealth: Payer: Self-pay | Admitting: Pharmacy Technician

## 2024-02-24 NOTE — Telephone Encounter (Signed)
" ° °  Faxed with corrected npi to [phone]  "

## 2024-02-24 NOTE — Telephone Encounter (Signed)
 I called the patient and left another message about below

## 2024-02-28 NOTE — Telephone Encounter (Signed)
 Faxed.

## 2024-03-03 NOTE — Telephone Encounter (Signed)
" ° ° °  Multaq  [employer] approval through 03/03/2025 "
